# Patient Record
Sex: Male | Born: 1965 | Race: Black or African American | Hispanic: No | Marital: Single | Smoking: Never smoker
Health system: Southern US, Community
[De-identification: ages and names within clinical notes are randomized; demographics above are authoritative.]

## PROBLEM LIST (undated history)

## (undated) DIAGNOSIS — IMO0002 Reserved for concepts with insufficient information to code with codable children: Secondary | ICD-10-CM

## (undated) DIAGNOSIS — I4891 Unspecified atrial fibrillation: Secondary | ICD-10-CM

## (undated) DIAGNOSIS — I5022 Chronic systolic (congestive) heart failure: Secondary | ICD-10-CM

## (undated) DIAGNOSIS — R400 Somnolence: Secondary | ICD-10-CM

## (undated) DIAGNOSIS — I639 Cerebral infarction, unspecified: Secondary | ICD-10-CM

## (undated) DIAGNOSIS — I472 Ventricular tachycardia, unspecified: Secondary | ICD-10-CM

## (undated) DIAGNOSIS — Z79899 Other long term (current) drug therapy: Secondary | ICD-10-CM

## (undated) DIAGNOSIS — R9430 Abnormal result of cardiovascular function study, unspecified: Secondary | ICD-10-CM

## (undated) DIAGNOSIS — I428 Other cardiomyopathies: Secondary | ICD-10-CM

## (undated) DIAGNOSIS — I340 Nonrheumatic mitral (valve) insufficiency: Secondary | ICD-10-CM

## (undated) DIAGNOSIS — Z0271 Encounter for disability determination: Secondary | ICD-10-CM

## (undated) DIAGNOSIS — Z95810 Presence of automatic (implantable) cardiac defibrillator: Secondary | ICD-10-CM

## (undated) DIAGNOSIS — Z4502 Encounter for adjustment and management of automatic implantable cardiac defibrillator: Secondary | ICD-10-CM

## (undated) DIAGNOSIS — R51 Headache: Secondary | ICD-10-CM

## (undated) DIAGNOSIS — N289 Disorder of kidney and ureter, unspecified: Secondary | ICD-10-CM

## (undated) DIAGNOSIS — E876 Hypokalemia: Secondary | ICD-10-CM

## (undated) HISTORY — DX: Encounter for adjustment and management of automatic implantable cardiac defibrillator: Z45.02

## (undated) HISTORY — DX: Abnormal result of cardiovascular function study, unspecified: R94.30

## (undated) HISTORY — DX: Nonrheumatic mitral (valve) insufficiency: I34.0

## (undated) HISTORY — DX: Unspecified atrial fibrillation: I48.91

## (undated) HISTORY — DX: Disorder of kidney and ureter, unspecified: N28.9

## (undated) HISTORY — DX: Other cardiomyopathies: I42.8

## (undated) HISTORY — DX: Hypokalemia: E87.6

## (undated) HISTORY — DX: Ventricular tachycardia, unspecified: I47.20

## (undated) HISTORY — DX: Somnolence: R40.0

## (undated) HISTORY — DX: Ventricular tachycardia: I47.2

## (undated) HISTORY — DX: Cerebral infarction, unspecified: I63.9

## (undated) HISTORY — DX: Reserved for concepts with insufficient information to code with codable children: IMO0002

## (undated) HISTORY — DX: Headache: R51

## (undated) HISTORY — DX: Encounter for disability determination: Z02.71

## (undated) HISTORY — DX: Other long term (current) drug therapy: Z79.899

## (undated) HISTORY — DX: Chronic systolic (congestive) heart failure: I50.22

---

## 2008-07-22 ENCOUNTER — Encounter: Payer: Self-pay | Admitting: Cardiology

## 2008-10-13 ENCOUNTER — Ambulatory Visit: Payer: Self-pay | Admitting: Cardiology

## 2008-10-14 ENCOUNTER — Encounter: Payer: Self-pay | Admitting: Cardiology

## 2008-10-20 ENCOUNTER — Encounter: Payer: Self-pay | Admitting: Cardiology

## 2008-11-03 ENCOUNTER — Encounter: Payer: Self-pay | Admitting: Physician Assistant

## 2008-11-03 ENCOUNTER — Ambulatory Visit: Payer: Self-pay | Admitting: Cardiology

## 2008-11-17 ENCOUNTER — Ambulatory Visit: Payer: Self-pay | Admitting: Cardiology

## 2008-11-17 HISTORY — PX: TEE WITH CARDIOVERSION: SHX5442

## 2008-12-05 ENCOUNTER — Ambulatory Visit: Payer: Self-pay | Admitting: Cardiology

## 2009-02-20 ENCOUNTER — Encounter: Payer: Self-pay | Admitting: Cardiology

## 2009-10-13 ENCOUNTER — Encounter: Payer: Self-pay | Admitting: Cardiology

## 2009-10-25 ENCOUNTER — Encounter: Payer: Self-pay | Admitting: Cardiology

## 2009-10-26 ENCOUNTER — Ambulatory Visit: Payer: Self-pay | Admitting: Cardiology

## 2009-11-02 ENCOUNTER — Telehealth (INDEPENDENT_AMBULATORY_CARE_PROVIDER_SITE_OTHER): Payer: Self-pay | Admitting: *Deleted

## 2009-11-16 ENCOUNTER — Ambulatory Visit: Payer: Self-pay | Admitting: Cardiology

## 2009-11-16 ENCOUNTER — Encounter: Payer: Self-pay | Admitting: Cardiology

## 2009-12-03 ENCOUNTER — Encounter: Payer: Self-pay | Admitting: Cardiology

## 2009-12-04 ENCOUNTER — Telehealth (INDEPENDENT_AMBULATORY_CARE_PROVIDER_SITE_OTHER): Payer: Self-pay | Admitting: *Deleted

## 2009-12-04 ENCOUNTER — Encounter: Payer: Self-pay | Admitting: Cardiology

## 2009-12-31 ENCOUNTER — Encounter: Payer: Self-pay | Admitting: Cardiology

## 2010-02-02 ENCOUNTER — Encounter: Payer: Self-pay | Admitting: Cardiology

## 2010-02-03 ENCOUNTER — Ambulatory Visit: Payer: Self-pay | Admitting: Cardiology

## 2010-02-03 ENCOUNTER — Encounter (INDEPENDENT_AMBULATORY_CARE_PROVIDER_SITE_OTHER): Payer: Self-pay | Admitting: *Deleted

## 2010-08-16 ENCOUNTER — Encounter (INDEPENDENT_AMBULATORY_CARE_PROVIDER_SITE_OTHER): Payer: Self-pay | Admitting: *Deleted

## 2010-08-16 ENCOUNTER — Ambulatory Visit: Payer: Self-pay | Admitting: Cardiology

## 2010-08-19 ENCOUNTER — Ambulatory Visit: Payer: Self-pay | Admitting: Cardiology

## 2010-08-19 ENCOUNTER — Inpatient Hospital Stay (HOSPITAL_BASED_OUTPATIENT_CLINIC_OR_DEPARTMENT_OTHER): Admission: RE | Admit: 2010-08-19 | Discharge: 2010-08-19 | Payer: Self-pay | Admitting: Cardiology

## 2010-09-03 ENCOUNTER — Ambulatory Visit: Payer: Self-pay | Admitting: Cardiology

## 2010-09-06 ENCOUNTER — Encounter: Payer: Self-pay | Admitting: Cardiology

## 2010-09-13 ENCOUNTER — Encounter: Payer: Self-pay | Admitting: Cardiology

## 2010-09-16 ENCOUNTER — Encounter (INDEPENDENT_AMBULATORY_CARE_PROVIDER_SITE_OTHER): Payer: Self-pay | Admitting: *Deleted

## 2010-10-01 ENCOUNTER — Encounter: Payer: Self-pay | Admitting: Internal Medicine

## 2010-10-04 ENCOUNTER — Ambulatory Visit: Payer: Self-pay | Admitting: Internal Medicine

## 2010-10-08 ENCOUNTER — Encounter: Payer: Self-pay | Admitting: Cardiology

## 2010-10-11 ENCOUNTER — Ambulatory Visit: Payer: Self-pay | Admitting: Cardiology

## 2010-11-04 ENCOUNTER — Encounter: Payer: Self-pay | Admitting: Internal Medicine

## 2010-11-11 ENCOUNTER — Encounter: Payer: Self-pay | Admitting: Cardiology

## 2010-11-11 ENCOUNTER — Ambulatory Visit (HOSPITAL_COMMUNITY)
Admission: RE | Admit: 2010-11-11 | Discharge: 2010-11-12 | Payer: Self-pay | Source: Home / Self Care | Attending: Internal Medicine | Admitting: Internal Medicine

## 2010-11-11 DIAGNOSIS — Z4502 Encounter for adjustment and management of automatic implantable cardiac defibrillator: Secondary | ICD-10-CM

## 2010-11-11 HISTORY — DX: Encounter for adjustment and management of automatic implantable cardiac defibrillator: Z45.02

## 2010-11-11 HISTORY — PX: CARDIAC DEFIBRILLATOR PLACEMENT: SHX171

## 2010-11-12 ENCOUNTER — Encounter: Payer: Self-pay | Admitting: Internal Medicine

## 2010-11-12 LAB — BASIC METABOLIC PANEL
BUN: 11 mg/dL (ref 6–23)
CO2: 22 mEq/L (ref 19–32)
Calcium: 8.9 mg/dL (ref 8.4–10.5)
Chloride: 102 mEq/L (ref 96–112)
Creatinine, Ser: 1.55 mg/dL — ABNORMAL HIGH (ref 0.4–1.5)
GFR calc Af Amer: 59 mL/min — ABNORMAL LOW (ref >60)
GFR calc non Af Amer: 49 mL/min — ABNORMAL LOW (ref >60)
Glucose, Bld: 134 mg/dL — ABNORMAL HIGH (ref 70–99)
Potassium: 4.5 mEq/L (ref 3.5–5.1)
Sodium: 137 mEq/L (ref 135–145)

## 2010-11-22 ENCOUNTER — Encounter: Payer: Self-pay | Admitting: Internal Medicine

## 2010-11-22 ENCOUNTER — Ambulatory Visit: Admission: RE | Admit: 2010-11-22 | Discharge: 2010-11-22 | Payer: Self-pay | Source: Home / Self Care

## 2010-11-30 ENCOUNTER — Encounter: Payer: Self-pay | Admitting: Cardiology

## 2010-12-01 ENCOUNTER — Ambulatory Visit
Admission: RE | Admit: 2010-12-01 | Discharge: 2010-12-01 | Payer: Self-pay | Source: Home / Self Care | Attending: Cardiology | Admitting: Cardiology

## 2010-12-01 ENCOUNTER — Encounter: Payer: Self-pay | Admitting: Physician Assistant

## 2010-12-02 LAB — SURGICAL PCR SCREEN
MRSA, PCR: NEGATIVE
Staphylococcus aureus: NEGATIVE

## 2010-12-07 NOTE — Miscellaneous (Signed)
  Clinical Lists Changes  Observations: Added new observation of CARDCATHFIND:  HEMODYNAMIC DATA:  The wedge pressure was 16.  Pulmonary pressure was   58/24 with a mean of 36.  Pulmonary wedge pressure was 19 mean.  Left   ventricular pressure was 100/24.  The aortic pressure was 100/74 with a   mean of 87.  Cardiac output/cardiac index was 5.5/2.5 L/minute/meter   squared.  Pulmonary resistance calculated at 3.1 meters.      CONCLUSIONS:   1. Severe left ventricular dysfunction with global hypokinesis and       estimated ejection fraction of 15%.   2. Normal coronary angiography.   3. Elevated pulmonary wedge and pulmonary artery pressure with       elevated pulmonary vascular resistance of 3.1.      RECOMMENDATIONS:  Continued medical management.  The patient has a   narrow QRS and is probably not a candidate for biventricular pacing, but   may be a candidate for ICD placement.  We will arrange followup with Dr.   Myrtis Ser.               Bruce Elvera Lennox Juanda Chance, MD, [facility]  (08/20/2010 15:58)        Cardiac Cath  Procedure date:  08/20/2010  Findings:       HEMODYNAMIC DATA:  The wedge pressure was 16.  Pulmonary pressure was   58/24 with a mean of 36.  Pulmonary wedge pressure was 19 mean.  Left   ventricular pressure was 100/24.  The aortic pressure was 100/74 with a   mean of 87.  Cardiac output/cardiac index was 5.5/2.5 L/minute/meter   squared.  Pulmonary resistance calculated at 3.1 meters.      CONCLUSIONS:   1. Severe left ventricular dysfunction with global hypokinesis and       estimated ejection fraction of 15%.   2. Normal coronary angiography.   3. Elevated pulmonary wedge and pulmonary artery pressure with       elevated pulmonary vascular resistance of 3.1.      RECOMMENDATIONS:  Continued medical management.  The patient has a   narrow QRS and is probably not a candidate for biventricular pacing, but   may be a candidate for ICD placement.  We will arrange  followup with Dr.   Myrtis Ser.               Bruce Elvera Lennox Juanda Chance, MD, [facility]

## 2010-12-07 NOTE — Letter (Signed)
Summary: Appointment- Rescheduled  [facility] [address]   [place], [place] [address]   Phone: [phone]  Fax: [phone]     December 31, 2009 MRN: [id number]     [facility] [address] [place], [place]  [address]     Dear Adam [name],   Due to a change in our office schedule, your appointment on March 31 at 1:45 pm                     must be changed.    Your new appointment is scheduled for February 03, 2010 at 3:15 pm.  We look forward to participating in your health care needs.   Please contact us at the number listed above at your earliest convenience to reschedule this appointment if needed.       Sincerely,  Glass blower/designer

## 2010-12-07 NOTE — Progress Notes (Signed)
Summary: INCREASE BENAZEPRIL/NEED F/U  Phone Note Outgoing Call Call back at [facility] Phone [phone]   Call placed by: Carlye Grippe,  December 04, 2009 1:28 PM Call placed to: Patient's wife Summary of Call: spoke with wife and informed her that per Dr. Myrtis Ser,  benazepril needed to be increased to 10mg  daily and new prescription sent to [facility]  and that it was important that he f/u. Informed wife that he could take two of the 5mg  till they are finished. Initial call taken by: Carlye Grippe,  December 04, 2009 1:30 PM    New/Updated Medications: BENAZEPRIL HCL 10 MG TABS (BENAZEPRIL HCL) Take 1 tablet by mouth once a day Prescriptions: BENAZEPRIL HCL 10 MG TABS (BENAZEPRIL HCL) Take 1 tablet by mouth once a day  #30 x 6   Entered by:   Carlye Grippe   Authorized by:   Talitha Givens, MD, [facility]   Signed by:   Carlye Grippe on 12/04/2009   Method used:   Electronically to        [facility] (retail)       [address]       [place], [place]  [address]       Ph: [phone]       Fax: [phone]   RxID:   [id number]

## 2010-12-07 NOTE — Letter (Signed)
Summary: Engineer, materials at [facility]  [address]   [place], [place] [address]   Phone: [phone]  Fax: [phone]        September 16, 2010 MRN: [id number]    [facility] [address] [place], [place]  [address]    Dear Mr. Nikolai,  Your test ordered by Selena Batten has been reviewed by your physician (or physician assistant) and was found to be normal or stable. Your physician (or physician assistant) felt no changes were needed at this time.  __X__ Echocardiogram  ____ Cardiac Stress Test  __X__ Lab Work  ____ Peripheral vascular study of arms, legs or neck  ____ CT scan or X-ray  ____ Lung or Breathing test  ____ Other: KEEP APPOINTMENT WITH DR. ALLRED ON NOVEMBER 28TH IN [place]    AS PLANNED.   Thank you.   Cyril Loosen, RN, BSN    Duane Boston, M.D., F.A.C.C. Thressa Sheller, M.D., F.A.C.C. Oneal Grout, M.D., F.A.C.C. Cheree Ditto, M.D., F.A.C.C. Daiva Nakayama, M.D., F.A.C.C. Kenney Houseman, M.D., F.A.C.C. Jeanne Ivan, PA-C

## 2010-12-07 NOTE — Letter (Signed)
Summary: Appointment -missed  [facility] [address]   [place], [place] [address]   Phone: [phone]  Fax: [phone]     December 04, 2009 MRN: [id number]     [facility] [address] [place], [place]  [address]     Dear Mr. Baumler,  Our records indicate you missed your appointment on December 04, 2009                        with Dr.  Myrtis Ser.   It is very important that we reach you to reschedule this appointment. We look forward to participating in your health care needs.   Please contact us at the number listed above at your earliest convenience to reschedule this appointment.   Sincerely,    Glass blower/designer

## 2010-12-07 NOTE — Miscellaneous (Signed)
  Clinical Lists Changes  Observations: Added new observation of PAST MED HX: MITRAL REGURGITATION, MODERATE (ICD-396.3)...echo 10/2008...  /   TEE...11/17/2008.Marland Kitchenno LV clot /  mild to moderate mitral regurgitation..... echo.... January, 2011 SYSTOLIC HEART FAILURE, ACUTE (ICD-428.21) Cardiomyopathy...etiology unknown... multiple wall motion abnormalities..... patient has not wanted cardiac catheterization in the past. EF 35%..echo.....10/2008...multiple wall motion abnormalities /TEE  Jan/2010   /    EF 30-35%... echo... November 16, 2009... moderate left ventricular dilatation (02/02/2010 16:34) Added new observation of PRIMARY MD: Ernestine Conrad, MD (02/02/2010 16:34)       Past History:  Past Medical History: MITRAL REGURGITATION, MODERATE (ICD-396.3)...echo 10/2008...  /   TEE...11/17/2008.Marland Kitchenno LV clot /  mild to moderate mitral regurgitation..... echo.... January, 2011 SYSTOLIC HEART FAILURE, ACUTE (ICD-428.21) Cardiomyopathy...etiology unknown... multiple wall motion abnormalities..... patient has not wanted cardiac catheterization in the past. EF 35%..echo.....10/2008...multiple wall motion abnormalities /TEE  Jan/2010   /    EF 30-35%... echo... November 16, 2009... moderate left ventricular dilatation

## 2010-12-07 NOTE — Assessment & Plan Note (Signed)
Summary: 1 MOFU R/S FROM NO SHOW 1/28   Visit Type:  Follow-up Primary Provider:  Ernestine Conrad, MD  CC:  cardiomyopathy.  History of Present Illness: Patient is seen for followup of cardiomyopathy.  I saw him last in December, 2010.  We arranged for an echo in January, 2011.  That was done and the patient did not come for a visit at that time but is here today.  The EF remains in the 35% range.  I am disappointed that it has not improved substantially but the patient is feeling well.  He is taking his medications.  Preventive Screening-Counseling & Management  Alcohol-Tobacco     Smoking Status: never  Problems Prior to Update: 1)  Cardiomyopathy  (ICD-425.4) 2)  Mitral Regurgitation, Moderate  (ICD-396.3) 3)  Systolic Heart Failure, Acute  (ICD-428.21)  Current Medications (verified): 1)  Benazepril Hcl 10 Mg Tabs (Benazepril Hcl) .... Take 1 Tablet By Mouth Once A Day 2)  Furosemide 40 Mg Tabs (Furosemide) .... Take One Tablet By Mouth Daily. 3)  Coreg 6.25 Mg Tabs (Carvedilol) .... Take 1 Tablet By Mouth Twice A Day 4)  Aspirin Ec 325 Mg Tbec (Aspirin) .... Take One Tablet By Mouth Daily 5)  Simvastatin 20 Mg Tabs (Simvastatin) .... Take One Tablet By Mouth Daily At Bedtime  Allergies (verified): No Known Drug Allergies  Comments:  Nurse/Medical Assistant: The patient's medications and allergies were reviewed with the patient and were updated in the Medication and Allergy Lists. pt did not have list or bottles verbally verifed prescriptions  Past History:  Past Medical History: Last updated: 02/02/2010 MITRAL REGURGITATION, MODERATE (ICD-396.3)...echo 10/2008...  /   TEE...11/17/2008.Marland Kitchenno LV clot /  mild to moderate mitral regurgitation..... echo.... January, 2011 SYSTOLIC HEART FAILURE, ACUTE (ICD-428.21) Cardiomyopathy...etiology unknown... multiple wall motion abnormalities..... patient has not wanted cardiac catheterization in the past. EF  35%..echo.....10/2008...multiple wall motion abnormalities /TEE  Jan/2010   /    EF 30-35%... echo... November 16, 2009... moderate left ventricular dilatation  Family History: Last updated: 08/19/2009 Family History of Cancer:  Family History of Coronary Artery Disease:  Family History of Hypertension:   Social History: Last updated: 08/19/2009 Full Time Single  Tobacco Use - No.  Alcohol Use - yes Regular Exercise - no Drug Use - no  Risk Factors: Exercise: no (08/19/2009)  Review of Systems       Patient denies fever, chills, headache, sweats, rash, she vision, change in hearing, chest pain, cough, nausea vomiting, urinary symptoms.  All other systems are reviewed and are negative.  Vital Signs:  Patient profile:   45 year old male Height:      71 inches Weight:      223.4 pounds Pulse rate:   51 / minute BP sitting:   107 / 74  (left arm) Cuff size:   regular  Vitals Entered By: Youlanda Mighty RN (February 03, 2010 3:18 PM) CC: cardiomyopathy   Physical Exam  General:  In general he stable. Eyes:  no xanthelasma. Neck:  no jugular venous distention. Lungs:  lungs are clear.  Respiratory effort is nonlabored. Heart:  cardiac exam reveals S1-S2.  No clicks or significant murmurs. Abdomen:  abdomen is soft. Msk:  no musculoskeletal deformities. Extremities:  no peripheral edema. Psych:  patient is oriented to person time and place.  Affect is normal.   Impression & Recommendations:  Problem # 1:  CARDIOMYOPATHY (ICD-425.4)  His updated medication list for this problem includes:    Benazepril Hcl 10  Mg Tabs (Benazepril hcl) .Marland Kitchen... Take 1 tablet by mouth once a day    Furosemide 40 Mg Tabs (Furosemide) .Marland Kitchen... Take one tablet by mouth daily.    Coreg 6.25 Mg Tabs (Carvedilol) .Marland Kitchen... Take 1 tablet by mouth twice a day    Aspirin Ec 325 Mg Tbec (Aspirin) .Marland Kitchen... Take one tablet by mouth daily The patient is taking his medications.  His blood pressure and heart rate  are such that I cannot push the dose is any higher.  He and I have had a very clear discussion about the fact that we should not decrease his medicines.  Problem # 2:  SYSTOLIC HEART FAILURE, ACUTE (ICD-428.21)  His updated medication list for this problem includes:    Benazepril Hcl 10 Mg Tabs (Benazepril hcl) .Marland Kitchen... Take 1 tablet by mouth once a day    Furosemide 40 Mg Tabs (Furosemide) .Marland Kitchen... Take one tablet by mouth daily.    Coreg 6.25 Mg Tabs (Carvedilol) .Marland Kitchen... Take 1 tablet by mouth twice a day    Aspirin Ec 325 Mg Tbec (Aspirin) .Marland Kitchen... Take one tablet by mouth daily Clinically the patient is stable and not having symptoms of congestive heart failure.  With his LV dysfunction catheterization could be considered at some point but the patient does not want this at this time.  Patient Instructions: 1)  Your physician recommends that you continue on your current medications as directed. Please refer to the Current Medication list given to you today. 2)  Your physician wants you to follow-up in: 6months. You will receive a reminder letter in the mail about two months in advance. If you don't receive a letter, please call our office to schedule the follow-up appointment.

## 2010-12-07 NOTE — Miscellaneous (Signed)
  Clinical Lists Changes  Problems: Removed problem of SYSTOLIC HEART FAILURE, ACUTE (ICD-428.21) Added new problem of CHF (ICD-428.0) Added new problem of * ICD CONSIDERATION Added new problem of ATRIAL FIBRILLATION (ICD-427.31) Observations: Added new observation of PAST MED HX: MITRAL REGURGITATION, MODERATE (ICD-396.3)...echo 10/2008...  /   TEE...11/17/2008.Marland Kitchenno LV clot /  mild to moderate mitral regurgitation..... echo.... January, 2011  /  mild-to-moderate.Marland Kitchen echo .Marland KitchenNovember, 201100 a CHF   systolic Cardiomyopathy  nonischemic    ..( presumed nonischemic.. 2009)...  /   cardiac catheterization.  August 19, 2010....normal coronary arteries EF 35%..echo.....10/2008...multiple wall motion abnormalities /  TEE  Jan/2010   /    EF 30-35%... echo... November 16, 2009... moderate left ventricular dilatation /   EF 20-25%..echo... September 13, 2010 mild-to-moderate mitral regurgitation.. normal RV function... PA pressure 45 mm mercury (10/08/2010 8:12) Added new observation of REFERRING MD: Dr Myrtis Ser (10/08/2010 8:12) Added new observation of PRIMARY MD: Ernestine Conrad, MD (10/08/2010 8:12)       Past History:  Past Medical History: MITRAL REGURGITATION, MODERATE (ICD-396.3)...echo 10/2008...  /   TEE...11/17/2008.Marland Kitchenno LV clot /  mild to moderate mitral regurgitation..... echo.... January, 2011  /  mild-to-moderate.Marland Kitchen echo .Marland KitchenNovember, 201100 a CHF   systolic Cardiomyopathy  nonischemic    ..( presumed nonischemic.. 2009)...  /   cardiac catheterization.  August 19, 2010....normal coronary arteries EF 35%..echo.....10/2008...multiple wall motion abnormalities /  TEE  Jan/2010   /    EF 30-35%... echo... November 16, 2009... moderate left ventricular dilatation /   EF 20-25%..echo... September 13, 2010 mild-to-moderate mitral regurgitation.. normal RV function... PA pressure 45 mm mercury

## 2010-12-07 NOTE — Letter (Signed)
Summary: Implantable Device Instructions  [employer]ural [employer], Main Office  [address]   [place], [place] [address]   Phone: [phone]  Fax: [phone]      Implantable Device Instructions  You are scheduled for:  _____ Implantable Cardioverter Defibrillator   on 11/11/2010 with Dr. Johney Frame.  1.  Please arrive at the Short Stay Center at [facility] at 7:30am on the day of your procedure.  2.  Do not eat or drink after midnight the night before your procedure.  3.  Complete lab work on 11/04/10 at the [facility]  .  You do not have to be fasting.  4.  Do NOT take these medications for the morning of your procedure:  Furosemide.  5.  Plan for an overnight stay.  Bring your insurance cards and a list of your medications.  6.  Wash your chest and neck with antibacterial soap (any brand) the evening before and the morning of your procedure.  Rinse well.  7.  Education material received:               ICD _____           *If you have ANY questions after you get home, please call the office [phone]. Anselm Pancoast  *Every attempt is made to prevent procedures from being rescheduled.  Due to the nauture of Electrophysiology, rescheduling can happen.  The physician is always aware and directs the staff when this occurs.

## 2010-12-07 NOTE — Miscellaneous (Signed)
  Clinical Lists Changes  Observations: Added new observation of PAST MED HX: MITRAL REGURGITATION, MODERATE (ICD-396.3)...echo 10/2008...  /   TEE...11/17/2008.Marland Kitchenno LV clot /  mild to moderate mitral regurgitation..... echo.... January, 2011 SYSTOLIC HEART FAILURE, ACUTE (ICD-428.21) Cardiomyopathy...etiology unknown... multiple wall motion abnormalities..... patient has not wanted cardiac catheterization in the past. EF 35%..echo.....10/2008...multiple wall motion abnormalities /  EF 30-35%... echo... November 16, 2009... moderate left ventricular dilatation (12/03/2009 17:00) Added new observation of PRIMARY MD: Ernestine Conrad, MD (12/03/2009 17:00)       Past History:  Past Medical History: MITRAL REGURGITATION, MODERATE (ICD-396.3)...echo 10/2008...  /   TEE...11/17/2008.Marland Kitchenno LV clot /  mild to moderate mitral regurgitation..... echo.... January, 2011 SYSTOLIC HEART FAILURE, ACUTE (ICD-428.21) Cardiomyopathy...etiology unknown... multiple wall motion abnormalities..... patient has not wanted cardiac catheterization in the past. EF 35%..echo.....10/2008...multiple wall motion abnormalities /  EF 30-35%... echo... November 16, 2009... moderate left ventricular dilatation

## 2010-12-07 NOTE — Assessment & Plan Note (Signed)
Summary: 2 WK F/U-POST CATH-JM   Visit Type:  Follow-up Primary Ariele Vidrio:  Ernestine Conrad, MD  CC:  cardiomyopathy.  History of Present Illness: The patient is seen for followup of cardiomyopathy.  I saw him last August 16, 2010.  At that time I decided to increase his diuretic dose slightly.  I also made plans for cardiac catheterization.  The catheter was done on August 19, 2010.  I spoke with Dr. Juanda Chance that day and I have reviewed the catheter report.  The patient had no significant coronary artery disease.  His ejection fraction estimate in the Cath Lab was 15%.  He had elevated wedge and PA pressures.  Today I am seeing him for further followup.  With the addition of increased diuretic, he has not had any resting shortness of breath.  He still has shortness of breath with activity at work when walking up stairs.  Preventive Screening-Counseling & Management  Alcohol-Tobacco     Smoking Status: never  Current Medications (verified): 1)  Benazepril Hcl 5 Mg Tabs (Benazepril Hcl) .... Take 1 Tablet By Mouth Once A Day 2)  Furosemide 40 Mg Tabs (Furosemide) .... Take One Tablet By Mouth Two Times A Day 3)  Coreg 6.25 Mg Tabs (Carvedilol) .... Take 1/2 Tablet By Mouth Twice A Day 4)  Aspirin Ec 325 Mg Tbec (Aspirin) .... Take One Tablet By Mouth Daily 5)  Simvastatin 20 Mg Tabs (Simvastatin) .... Take One Tablet By Mouth Daily At Bedtime  Allergies (verified): No Known Drug Allergies  Comments:  Nurse/Medical Assistant: The patient is currently on medications but does not know the name or dosage at this time. Instructed to contact our office with details. Will update medication list at that time.  Past History:  Past Medical History: MITRAL REGURGITATION, MODERATE (ICD-396.3)...echo 10/2008...  /   TEE...11/17/2008.Marland Kitchenno LV clot /  mild to moderate mitral regurgitation..... echo.... January, 2011 SYSTOLIC HEART FAILURE, ACUTE (ICD-428.21) Cardiomyopathy..nonischemic..... in 2009  etiology was unknown.... cardiac catheterization.  August 19, 2010 reveals normal coronary arteries EF 35%..echo.....10/2008...multiple wall motion abnormalities /TEE  Jan/2010   /    EF 30-35%... echo... November 16, 2009... moderate left ventricular dilatation  Review of Systems       Patient denies fever, chills, headache, sweats, rash, change in vision, change in hearing, chest pain, cough, nausea vomiting, urinary symptoms.  All other systems are reviewed and are negative.  Vital Signs:  Patient profile:   45 year old male Height:      71 inches Weight:      216 pounds Pulse rate:   55 / minute BP sitting:   114 / 77  (left arm) Cuff size:   large  Vitals Entered By: Carlye Grippe (September 03, 2010 10:12 AM)  Physical Exam  General:  Patient is stable today. Head:  head is atraumatic. Eyes:  no xanthelasma. Neck:  no jugular venous distention. Chest Wall:  no chest wall tenderness. Lungs:  lungs are clear.  Respiratory effort is nonlabored. Heart:  cardiac exam reveals an S1-S2.  No clicks or significant murmurs. Abdomen:  abdomen is soft. Msk:  no musculoskeletal deformities. Extremities:  no peripheral edema. Skin:  no skin rashes. Psych:  patient is oriented to person time and place.  Affect is normal.   Impression & Recommendations:  Problem # 1:  MITRAL REGURGITATION, MODERATE (ICD-396.3) The patient has mitral regurgitation.  Originally in 2009 TEE was done to assess the valve and to rule out left ventricular clot.  There  was no definite clot seen.  Over time mitral regurgitation has been felt to be mild to moderate.  Followup echo will be done within the next few weeks.  Problem # 2:  CARDIOMYOPATHY (ICD-425.4)  His updated medication list for this problem includes:    Benazepril Hcl 5 Mg Tabs (Benazepril hcl) .Marland Kitchen... Take 1 tablet by mouth once a day    Furosemide 40 Mg Tabs (Furosemide) .Marland Kitchen... Take one tablet by mouth two times a day    Coreg 6.25 Mg Tabs  (Carvedilol) .Marland Kitchen... Take 1/2 tablet by mouth twice a day    Aspirin Ec 325 Mg Tbec (Aspirin) .Marland Kitchen... Take one tablet by mouth daily  Orders: T-Basic Metabolic Panel [id number]) 2-D Echocardiogram (2D Echo) The patient is stable from the symptom viewpoint.  He is able to work but gets short of breath when walking up stairs.  Cardiac catheterization revealed normal coronary arteries.  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  His filling pressures were high in the catheter lab.  I had increased his Lasix before that study and I was worried about his renal function.  However knowing that his filling pressures were high in the Cath Lab his dose of diuretic will be continued.  Chemistry is to be checked today to help with further decisions.  I reviewed all the specifics of the catheter report with the patient and his wife.  I also personally filled out a multiple page form to help him at work.  I spent greater than 45 minutes face-to-face with the patient and his wife discussing his cardiomyopathy.  I explained the potential risk of a dangerous arrhythmia.  I recommended EP evaluation for the possible placement of an ICD.  His QRS is narrow.  I suspect he will not be a candidate for CRT therapy.  However, the EP team will evaluate this question.  I explained to the patient that it may be helpful at some point for him to be seen by Dr.Bensimhon for a fall review of his status.  I reconsidered his medications today.  He is on only low-dose carvedilol, but his heart rate is already slow area he is on an ACE inhibitor.  I have not yet started spironolactone.  The patient now seems quite motivated to follow through on my recommendations.  I am hopeful that I will be able to continue to titrate his meds.    Problem # 3:  additional cardiomyopathy I am hopeful that over time I will be able to titrate his medicines up further. This has not been possible up to now as his office visits were irregular for a  period of time.  I do believe the patient now fully understands the magnitude of this problem.  An appointment is being made for EP evaluation.  I will then see him for further evaluation.  Patient Instructions: 1)  Labs:  BMET 2)  2D Echo 3)  New EP referral to Dr. Johney Frame 4)  Follow up in  4 wks

## 2010-12-07 NOTE — Letter (Signed)
Summary: Return To Work  [employer], Main Office  [address]   [place], [place] [address]   Phone: [phone]  Fax: [phone]    10/04/2010  TO: WHOM IT MAY CONCERN   RE: Adam [name] [address] [id number]   The above named individual is under my medical care and will be out of work from 11/11/2010-11/18/2010.  When he returns on 11/19/2010 he will return with light duty.  No lifting over 10 pounds until 12/23/2010 per Dr. Hillis Range  If you have any further questions or need additional information, please call.     Sincerely,    Dr. Fayrene Fearing Jamai Dolce/Kelly Darl Pikes, RN, BSN

## 2010-12-07 NOTE — Letter (Signed)
Summary: Internal Correspondence/ FAXED PRE-CATH ORDER  Internal Correspondence/ FAXED PRE-CATH ORDER   Imported By: Dorise Hiss 08/30/2010 15:32:00  _____________________________________________________________________  External Attachment:    Type:   Image     Comment:   External Document

## 2010-12-07 NOTE — Assessment & Plan Note (Signed)
Summary: 6 MO FU PER SEPT REMINDER-SRS   Visit Type:  Follow-up Primary Provider:  Ernestine Conrad, MD  CC:  cardiomyopathy.  History of Present Illness: The patient returns for followup evaluation of cardiomyopathy.  Up to this point the etiology has been unclear.  Patient previously had not been interested in proceeding with cardiac catheterization.  He now is interested.  He continues to work.  He does have shortness of breath with climbing stairs and this is not surprising.  He also has had some PND and orthopnea.  Taking his diuretic is problematic but he does it reliably.  If he takes his meds early in the morning and then goes to work it is a long distance for him to walk each time he needs to urinate.  He works 10-12 hour shifts.  If he takes the medicine at night it keeps him up all night.  Preventive Screening-Counseling & Management  Alcohol-Tobacco     Smoking Status: never  Current Medications (verified): 1)  Benazepril Hcl 5 Mg Tabs (Benazepril Hcl) .... Take 1 Tablet By Mouth Once A Day 2)  Furosemide 40 Mg Tabs (Furosemide) .... Take One Tablet By Mouth Daily. 3)  Coreg 6.25 Mg Tabs (Carvedilol) .... Take 1/2 Tablet By Mouth Twice A Day 4)  Aspirin Ec 325 Mg Tbec (Aspirin) .... Take One Tablet By Mouth Daily 5)  Simvastatin 20 Mg Tabs (Simvastatin) .... Take One Tablet By Mouth Daily At Bedtime  Allergies (verified): No Known Drug Allergies  Comments:  Nurse/Medical Assistant: The patient's medication bottles and allergies were reviewed with the patient and were updated in the Medication and Allergy Lists.  Past History:  Past Medical History: Last updated: 02/02/2010 MITRAL REGURGITATION, MODERATE (ICD-396.3)...echo 10/2008...  /   TEE...11/17/2008.Marland Kitchenno LV clot /  mild to moderate mitral regurgitation..... echo.... January, 2011 SYSTOLIC HEART FAILURE, ACUTE (ICD-428.21) Cardiomyopathy...etiology unknown... multiple wall motion abnormalities..... patient has not wanted  cardiac catheterization in the past. EF 35%..echo.....10/2008...multiple wall motion abnormalities /TEE  Jan/2010   /    EF 30-35%... echo... November 16, 2009... moderate left ventricular dilatation  Review of Systems       Patient denies fever, chills, rash, headache, change in vision, change in hearing, chest pain, cough, nausea vomiting, urinary symptoms.  All other systems are reviewed and are negative.  Vital Signs:  Patient profile:   45 year old male Height:      71 inches Weight:      215 pounds BMI:     30.09 Pulse rate:   73 / minute BP sitting:   112 / 73  (right arm) Cuff size:   large  Vitals Entered By: Carlye Grippe (August 16, 2010 8:34 AM)  Nutrition Counseling: Patient's BMI is greater than 25 and therefore counseled on weight management options.  Physical Exam  General:  The patient is stable today. Head:  head is atraumatic. Eyes:  no xanthelasma. Neck:  no jugular venous distention. Chest Wall:  no chest wall tenderness. Lungs:  lungs are clear.  Respiratory effort is nonlabored. Heart:  cardiac exam reveals S1-S2.  No clicks or significant murmurs. Abdomen:  abdomen is soft. Msk:  no musculoskeletal deformities. Extremities:  no peripheral edema. Skin:  no skin rashes. Psych:  patient is oriented to person time and place.  Affect is normal.   Impression & Recommendations:  Problem # 1:  CARDIOMYOPATHY (ICD-425.4)  His updated medication list for this problem includes:    Benazepril Hcl 5 Mg Tabs (Benazepril hcl) .Marland KitchenMarland KitchenMarland KitchenMarland Kitchen  Take 1 tablet by mouth once a day    Furosemide 40 Mg Tabs (Furosemide) .Marland Kitchen... Take one tablet by mouth two times a day    Coreg 6.25 Mg Tabs (Carvedilol) .Marland Kitchen... Take 1/2 tablet by mouth twice a day    Aspirin Ec 325 Mg Tbec (Aspirin) .Marland Kitchen... Take one tablet by mouth daily The patient's is having signs of symptomatic heart failure.  He is on an ACE inhibitor a diuretic and beta blocker.  Up to this point we have not been able to titrate  his drugs to a higher level because of hypotension and symptoms.  It appears that we now have leeway to push his meds up.  There is also volume component to his current symptoms.  We will increase his diuretic to 40 mg b.i.d. he will take his second dose at 3 or 4 in the afternoon.  He is now willing to proceed with cardiac catheterization.  We will proceed with left and right heart catheter.  Once we have this information we can determine the next steps.  I've chosen not to increase his other medications today because we are increasing his diuretic and we are planning cardiac catheterization.  At some point we will need to discuss more advanced therapies for his heart failure.  Orders: T-Chest x-ray, 2 views (44010) T-Basic Metabolic Panel [id number]) T-CBC No Diff ([id number]) T-Protime, Auto ([id number]) T-PTT ([id number]) EKG w/ Interpretation (93000) Cardiac Catheterization (Cardiac Cath)  Problem # 2:  MITRAL REGURGITATION, MODERATE (ICD-396.3) Mitral regurgitation was mild to moderate in January, 2011.  This will be assessed at catheter and with followup echo when appropriate.  Patient Instructions: 1)  Return for office visit with Dr. Myrtis Ser on Friday, September 03, 2010 at 10:30am. 2)  Increase Lasix (furosemide) 40mg  by mouth two times a day. 3)  Your physician recommends that you go to the [facility] for lab work and a chest x-ray: DO TODAY. 4)  Your physician has requested that you have a cardiac catheterization.  Cardiac catheterization is used to diagnose and/or treat various heart conditions. Doctors may recommend this procedure for a number of different reasons. The most common reason is to evaluate chest pain. Chest pain can be a symptom of coronary artery disease (CAD), and cardiac catheterization can show whether plaque is narrowing or blocking your heart's arteries. This procedure is also used to evaluate the valves, as well as measure the blood flow and oxygen levels in  different parts of your heart.  For further information please visit [email or web address]  Please follow instruction sheet, as given. Prescriptions: FUROSEMIDE 40 MG TABS (FUROSEMIDE) Take one tablet by mouth two times a day  #60 x 6   Entered by:   Cyril Loosen, RN, BSN   Authorized by:   Talitha Givens, MD, [facility]   Signed by:   Cyril Loosen, RN, BSN on 08/16/2010   Method used:   Electronically to        [facility] (retail)       [address]       [place], [place]  [address]       Ph: [phone]       Fax: [phone]   RxID:   [id number]

## 2010-12-07 NOTE — Assessment & Plan Note (Signed)
Summary: f62m -agh   Visit Type:  Follow-up Referring Provider:  Hillis Range, MD Primary Provider:  Ernestine Conrad, MD  CC:  cardiomyopathy.  History of Present Illness: Patient is seen for followup of his cardiomyopathy area and.  I saw him last October 28 02011.  Since then he has been seen by Dr.Allred, and ICD he is scheduled for January, 2012.  In many questions about this today but he is in agreement.  Is not having any chest pain or significant shortness of breath.  His ejection fraction was in the 25% range with the study that was done since the last visit.  His heart rate is 66.  He is on an ACE inhibitor and carvedilol.  I've chosen not to change his medicine today but to wait for his ICD be placed.  After that we will continue to push his medications higher.  Preventive Screening-Counseling & Management  Alcohol-Tobacco     Smoking Status: never  Current Medications (verified): 1)  Benazepril Hcl 5 Mg Tabs (Benazepril Hcl) .... Take 1 Tablet By Mouth Once A Day 2)  Furosemide 40 Mg Tabs (Furosemide) .... Take One Tablet By Mouth Two Times A Day 3)  Coreg 6.25 Mg Tabs (Carvedilol) .... Take 1/2 Tablet By Mouth Twice A Day 4)  Aspirin Ec 325 Mg Tbec (Aspirin) .... Take One Tablet By Mouth Daily 5)  Simvastatin 20 Mg Tabs (Simvastatin) .... Take One Tablet By Mouth Daily At Bedtime  Allergies (verified): No Known Drug Allergies  Comments:  Nurse/Medical Assistant: The patient's medications and allergies were verbally reviewed with the patient and were updated in the Medication and Allergy Lists.  Past History:  Past Medical History: MITRAL REGURGITATION, MODERATE (ICD-396.3)...echo 10/2008...  /   TEE...11/17/2008.Marland Kitchenno LV clot /  mild to moderate mitral regurgitation..... echo.... January, 2011  /  mild-to-moderate.Marland Kitchen echo .Marland KitchenNovember, 201100 a CHF   systolic Cardiomyopathy  nonischemic    ..( presumed nonischemic.. 2009)...  /   cardiac catheterization.  August 19, 2010....normal coronary arteries /  ICD be placed in January, 2012 EF 35%..echo.....10/2008...multiple wall motion abnormalities /  TEE  Jan/2010   /    EF 30-35%... echo... November 16, 2009... moderate left ventricular dilatation /   EF 20-25%..echo... September 13, 2010 mild-to-moderate mitral regurgitation.. normal RV function... PA pressure 45 mm mercury  Review of Systems       Patient denies fever, chills, headache, sweats, rash, change in vision, change in hearing, chest pain, cough, nausea vomiting, urinary symptoms.  All other systems are reviewed and are negative.  Vital Signs:  Patient profile:   45 year old male Height:      71 inches Weight:      220 pounds Pulse rate:   61 / minute BP sitting:   110 / 76  (left arm) Cuff size:   large  Vitals Entered By: Carlye Grippe (October 11, 2010 9:09 AM)  Physical Exam  General:  patient is stable. Head:  head is atraumatic. Eyes:  no xanthelasma. Neck:  no jugular distention. Chest Wall:  no chest wall tenderness. Lungs:  lungs are clear.  Respiratory effort is unlabored. Heart:  cardiac exam reveals S1-S2.  No clicks or significant murmurs. Abdomen:  abdomen soft. Msk:  No musculoskeletal deformities. Extremities:  no peripheral edema. Skin:  no skin rashes. Psych:  patient is oriented to person place her affect is normal.   Impression & Recommendations:  Problem # 1:  ATRIAL FIBRILLATION (ICD-427.31)  His updated  medication list for this problem includes:    Coreg 6.25 Mg Tabs (Carvedilol) .Marland Kitchen... Take 1/2 tablet by mouth twice a day    Aspirin Ec 325 Mg Tbec (Aspirin) .Marland Kitchen... Take one tablet by mouth daily We've not seen any recurring atrial fibrillation.  Problem # 2:  * ICD CONSIDERATION The patient is now scheduled for an ICD.  I had a long discussion with him today about this.  Problem # 3:  CARDIOMYOPATHY (ICD-425.4)  His updated medication list for this problem includes:    Benazepril Hcl 5 Mg Tabs  (Benazepril hcl) .Marland Kitchen... Take 1 tablet by mouth once a day    Furosemide 40 Mg Tabs (Furosemide) .Marland Kitchen... Take one tablet by mouth two times a day    Coreg 6.25 Mg Tabs (Carvedilol) .Marland Kitchen... Take 1/2 tablet by mouth twice a day    Aspirin Ec 325 Mg Tbec (Aspirin) .Marland Kitchen... Take one tablet by mouth daily Chosen not to change his medicines today.  After his ICD is in place I will consider further titration.  Problem # 4:  CHF (ICD-428.0) Clinically his volume status is stable.  No change in therapy.  I spoke with the patient and his wife for greater than 30 minutes today about all aspects of his care.  We'll see him back after his ICD is placed.  Patient Instructions: 1)  Follow up with Dr. Myrtis Ser on Wed, December 01, 2010 at 10:45am. 2)  Your physician recommends that you continue on your current medications as directed. Please refer to the Current Medication list given to you today.

## 2010-12-07 NOTE — Assessment & Plan Note (Signed)
Summary: nep/ discuss icd. pt has bcbs./ gd   Visit Type:  Initial Consult Referring Provider:  Dr Myrtis Ser Primary Provider:  Ernestine Conrad, MD   History of Present Illness: Adam [name] is a pleasant 45 yo AAM with a dilated CM (EF 20%) and NYHA Class II/III CHF who presents today for EP consultation regarding risks of sudden death.  He reports being in good health until 2009 when he presented to [facility] with acute decompensated CHF.  He was found to have an EF of 35%.  He initially declined cath and was followed by Dr Myrtis Ser with medical therapy.  He recently underwent [facility] 10/11 which revealed no CAD but severe global HK with EF 15%.  Despite optimal medical therapy, his EF has not improved.  He reports dypsnea with moderate activity.  He reports compliance with medical therapy.  He works in [employer] and has been on [employer] duty" due to inability to climb stairs or lift heavy boxes.  He reports frequent fatigue.  He has 2 pillow orthopnea. He previously had PND though this has improved with lasix.   He denies palpitations, CP, presyncope, or syncope. He denies viral illness, heavy ETOH, or polysubstance use.  Current Medications (verified): 1)  Benazepril Hcl 5 Mg Tabs (Benazepril Hcl) .... Take 1 Tablet By Mouth Once A Day 2)  Furosemide 40 Mg Tabs (Furosemide) .... Take One Tablet By Mouth Two Times A Day 3)  Coreg 6.25 Mg Tabs (Carvedilol) .... Take 1/2 Tablet By Mouth Twice A Day 4)  Aspirin Ec 325 Mg Tbec (Aspirin) .... Take One Tablet By Mouth Daily 5)  Simvastatin 20 Mg Tabs (Simvastatin) .... Take One Tablet By Mouth Daily At Bedtime  Allergies (verified): No Known Drug Allergies  Past History:  Past Medical History: MITRAL REGURGITATION, MODERATE (ICD-396.3)...echo 10/2008...  /   TEE...11/17/2008.Marland Kitchenno LV clot /  mild to moderate mitral regurgitation..... echo.... January, 2011 Chronic Systolic dysfunction Cardiomyopathy..nonischemic..... in 2009 etiology was unknown....  cardiac catheterization.  August 19, 2010 reveals normal coronary arteries EF 35%..echo.....10/2008...multiple wall motion abnormalities /TEE  Jan/2010   /    EF 30-35%... echo... November 16, 2009... moderate left ventricular dilatation  Past Surgical History: none  Family History: mother had HTN. Father had multiple MIs (earliest age 54),  he died at age 30 of MI Oldest brother died suddenly in prison of a presumed heart attack. Younger brother has congestive heart father. Paternal grandfather died after MI at 81s.    5 children are healthy.  Social History: Lives in [place] with fiance.  Works full Time for [employer]. Tobacco Use - No.  Alcohol Use - occasional (not heavy) Regular Exercise - no Drug Use - no  Review of Systems       All systems are reviewed and negative except as listed in the HPI.   Vital Signs:  Patient profile:   45 year old male Height:      71 inches Weight:      211 pounds BMI:     29.53 Pulse rate:   55 / minute BP sitting:   106 / 66  (left arm)  Vitals Entered By: Laurance Flatten CMA (October 04, 2010 2:18 PM)  Physical Exam  General:  Well developed, well nourished, in no acute distress. Head:  normocephalic and atraumatic Eyes:  PERRLA/EOM intact; conjunctiva and lids normal. Mouth:  Teeth, gums and palate normal. Oral mucosa normal. Neck:  Neck supple, no JVD. No masses, thyromegaly or abnormal cervical nodes.  Lungs:  Clear bilaterally to auscultation and percussion. Heart:  Non-displaced PMI, chest non-tender; regular rate and rhythm, S1, S2 without murmurs, rubs or gallops. Carotid upstroke normal, no bruit. Normal abdominal aortic size, no bruits. Femorals normal pulses, no bruits. Pedals normal pulses. No edema, no varicosities. Abdomen:  Bowel sounds positive; abdomen soft and non-tender without masses, organomegaly, or hernias noted. No hepatosplenomegaly. Msk:  Back normal, normal gait. Muscle strength and tone normal. Pulses:  pulses  normal in all 4 extremities Extremities:  No clubbing or cyanosis. Neurologic:  Alert and oriented x 3. Skin:  Intact without lesions or rashes. Cervical Nodes:  no significant adenopathy Psych:  Normal affect.   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  He meets SCD-HeFT criteria for ICD implantation for primary prevention of sudden death.  Risks, benefits, alternatives to ICD implantation were discussed in detail with the patient today.  He understands that the risks include but are not limited to bleeding, infection, pneumothorax, perforation, tamponade, vascular damage, renal failure, MI, stroke, death, inappropriate shocks, and lead dislodgement.  He accepts these risks and wishes to proceed.  Given his bradycardia, I would recommend a dual chamber ICD.  He does not meet indication for CRT.  Given concerns for elevated DFT, I would recommend a St Jude ICD.  Other Orders: EKG w/ Interpretation (93000)  Patient Instructions: 1)  Your physician has recommended that you have a defibrillator inserted.  An implantable cardioverter defibrillator (ICD) is a small device that is placed in your chest or, in rare cases, your abdomen. This device uses electrical pulses or shocks to help control life-threatening, irregular heartbeats that could lead the heart to suddenly stop beating (sudden cardiac arrest). Leads are attached to the ICD that goes into your heart. This is done in the hospital and usually requires an overnight stay. Please see the instruction sheet given to you today for more information.

## 2010-12-07 NOTE — Letter (Signed)
Summary: Cardiac Cath Instructions - [facility]  [facility] at [facility] [address]. [address]   [place], [place] [address]   Phone: [phone]  Fax: [phone]     08/16/2010 MRN: [id number]  [facility] [address] [place], [place]  [address]  Dear Mr. Gathright,   You are scheduled for a Cardiac Catheterization on Thursday, August 19, 2010 with Dr. Juanda Chance.  Please arrive to the 1st floor of the Heart and Vascular Center at [facility] at 0930 am on the day of your procedure. Please do not arrive before 6:30 a.m. Call the Heart and Vascular Center at [phone] if you are unable to make your appointmnet. The Code to get into the parking garage under the building is 0200. Take the elevators to the 1st floor. You must have someone to drive you home. Someone must be with you for the first 24 hours after you arrive home. Please wear clothes that are easy to get on and off and wear slip-on shoes. Do not eat or drink after midnight except water with your medications that morning. Bring all your medications and current insurance cards with you.  _X__ DO NOT take these medications before your procedure: Hold Furosemide the morning of your cath.  _X__ Make sure you take your aspirin.  _X__ You may take all of your remaining medications with water that morning.  ___ DO NOT take ANY medications before your procedure.  ___ Pre-med instructions:  ________________________________________________________________________  The usual length of stay after your procedure is 2 to 3 hours. This can vary.  If you have any questions, please call the office at the number listed above.  Cyril Loosen, RN, BSN            Directions to the [facility]  Please Note : Park in [place] under the building not the parking deck.  From [employer]: Turn onto [address] Left onto [place] (1st stoplight) Right at the brick entrance to  the hospital ([address] circle [address]) Bear to the right and you will see a blue sign "Heart and Vascular Center" Parking garage is a sharp right'to get through the gate out in the code _0200______. Once you park, take the elevator to the first floor. Please do not arrive before 0630am. The building will be dark before that time.   From [address] Turn onto [address] Turn left into the brick entrance to the hospital (Main circle drive) [place] to the right and you will see a blue sign "[facility] Center" Parking garage is a sharp right, to get thru the gate put in the code _0200___. Once you park, take the elevator to the first floor. Please do not arrive before 0630am. The building will be dark before that time

## 2010-12-07 NOTE — Letter (Signed)
Summary: Work Writer at [employer]	[place]. [address]   [place], [place] [address]   Phone: [phone]  Fax: [phone]     February 03, 2010    [facility] Adam [name]   The above named patient had a medical visit today  at our office.   Please take this into consideration when reviewing the time away from work/school.      Sincerely yours,  Recardo Evangelist.  [facility]

## 2010-12-07 NOTE — Miscellaneous (Signed)
  Clinical Lists Changes  Observations: Added new observation of CARDIO HPI: The patient was scheduled to be seen today.  When he did not come for his visit we called him.  An echo had been done and I want to review it with him.  Also I want to carefully follow his medications for his left ventricular dysfunction.  The echo was done anyway to 10, 2011.  The left ventricle is dilated.  Ejection fraction is 30-35%.  Because the patient's blood pressure was slightly elevated at the time of his last visit we will pass the message by telephone and he should increase his benazepril to 10 mg daily.  We will make a followup visit for him I strongly encourage him to be here so that we can provide him optimal care. (12/04/2009 12:39) Added new observation of PRIMARY MD: Ernestine Conrad, MD (12/04/2009 12:39)      Primary Provider:  Ernestine Conrad, MD   History of Present Illness: The patient was scheduled to be seen today.  When he did not come for his visit we called him.  An echo had been done and I want to review it with him.  Also I want to carefully follow his medications for his left ventricular dysfunction.  The echo was done anyway to 10, 2011.  The left ventricle is dilated.  Ejection fraction is 30-35%.  Because the patient's blood pressure was slightly elevated at the time of his last visit we will pass the message by telephone and he should increase his benazepril to 10 mg daily.  We will make a followup visit for him I strongly encourage him to be here so that we can provide him optimal care.

## 2010-12-08 ENCOUNTER — Telehealth (INDEPENDENT_AMBULATORY_CARE_PROVIDER_SITE_OTHER): Payer: Self-pay | Admitting: *Deleted

## 2010-12-08 ENCOUNTER — Telehealth: Payer: Self-pay | Admitting: Internal Medicine

## 2010-12-09 NOTE — Miscellaneous (Signed)
Summary: Device preload  Clinical Lists Changes  Observations: Added new observation of ICD INDICATN: ICM  (11/12/2010 13:11) Added new observation of ICDLEADSTAT2: active (11/12/2010 13:11) Added new observation of ICDLEADSER2: [id number] (11/12/2010 13:11) Added new observation of ICDLEADMOD2: 7122  (11/12/2010 13:11) Added new observation of ICDLEADLOC2: RV  (11/12/2010 13:11) Added new observation of ICDLEADSTAT1: active  (11/12/2010 13:11) Added new observation of ICDLEADSER1: [id number]  (11/12/2010 13:11) Added new observation of ICDLEADMOD1: 2130QM  (11/12/2010 13:11) Added new observation of ICDLEADLOC1: RA  (11/12/2010 13:11) Added new observation of ICD IMP MD: Hillis Range, MD  (11/12/2010 13:11) Added new observation of ICDLEADDOI2: 11/11/2010  (11/12/2010 13:11) Added new observation of ICDLEADDOI1: 11/11/2010  (11/12/2010 13:11) Added new observation of ICD IMPL DTE: 11/11/2010  (11/12/2010 13:11) Added new observation of ICD SERL#: [id number]  (11/12/2010 13:11) Added new observation of ICD MODL#: [id number]  (11/12/2010 41:32) Added new observation of ICDMANUFACTR: St Jude  (11/12/2010 13:11) Added new observation of ICD MD: Hillis Range, MD  (11/12/2010 13:11)       ICD Specifications Following MD:  Hillis Range, MD     ICD Vendor:  St Jude     ICD Model Number:  [id number]     ICD Serial Number:  [id number] ICD DOI:  11/11/2010     ICD Implanting MD:  Hillis Range, MD  Lead 1:    Location: RA     DOI: 11/11/2010     Model #: [id number]     Serial #: [id number]     Status: active Lead 2:    Location: RV     DOI: 11/11/2010     Model #: [id number]     Serial #: [id number]     Status: active  Indications::  ICM

## 2010-12-09 NOTE — Assessment & Plan Note (Signed)
Summary: 6 WK F/U PER 12/5 OV-JM   Visit Type:  Follow-up Primary Provider:  Ernestine Conrad, MD   History of Present Illness: patient returns for scheduled followup.  Since his last OV, he has undergone successful placement of a Medtronic dual-chamber ICD, on 01/05, by Dr. Johney Frame. He has been seen for routine followup in our pacer clinic in [place], and is scheduled to return to Dr Johney Frame , here in the device clinic, in April.  Clinically, he denies any postop complications, or symptoms suggestive of decompensated heart failure. He denies any firing of his device. He recalls having some brief "fluttering", the day after his discharge, but none since then.  Preventive Screening-Counseling & Management  Alcohol-Tobacco     Smoking Status: never  Current Medications (verified): 1)  Benazepril Hcl 5 Mg Tabs (Benazepril Hcl) .... Take 1 Tablet By Mouth Once A Day 2)  Furosemide 40 Mg Tabs (Furosemide) .... Take One Tablet By Mouth Two Times A Day 3)  Coreg 6.25 Mg Tabs (Carvedilol) .... Take 1/2 Tablet By Mouth Twice A Day 4)  Aspirin Ec 325 Mg Tbec (Aspirin) .... Take One Tablet By Mouth Daily 5)  Simvastatin 20 Mg Tabs (Simvastatin) .... Take One Tablet By Mouth Daily At Bedtime  Allergies (verified): No Known Drug Allergies  Comments:  Nurse/Medical Assistant: The patient's medications and allergies were verbally reviewed with the patient and were updated in the Medication and Allergy Lists.  Past History:  Past Medical History: MITRAL REGURGITATION, MODERATE (ICD-396.3)...echo 10/2008...  /   TEE...11/17/2008.Marland Kitchenno LV clot /  mild to moderate mitral regurgitation..... echo.... January, 2011  /  mild-to-moderate.Marland Kitchen echo .Marland KitchenNovember, 2011 CHF   systolic Cardiomyopathy  nonischemic    ..( presumed nonischemic.. 2009)...  /   cardiac catheterization.  August 19, 2010....normal coronary arteries  EF 35%..echo.....10/2008...multiple wall motion abnormalities /  TEE  Jan/2010   /    EF 30-35%...  echo... November 16, 2009... moderate left ventricular dilatation /   EF 20-25%..echo... September 13, 2010 mild-to-moderate mitral regurgitation.. normal RV function... PA pressure 45 mm mercury ICD   November 11, 2010  [employer]. Jude  Dr.Allred  Renal insufficiency... creatinine 1.5 (GFR greater than 50)  Review of Systems       No fevers, chills, hemoptysis, dysphagia, melena, hematocheezia, hematuria, rash, claudication, orthopnea, pnd, pedal edema. All other systems negative.   Vital Signs:  Patient profile:   45 year old male Height:      71 inches Weight:      230 pounds Pulse rate:   65 / minute BP sitting:   121 / 80  (left arm) Cuff size:   large  Vitals Entered By: Carlye Grippe (December 01, 2010 10:49 AM)  Physical Exam  Additional Exam:  GEN: 45 year old male, sitting upright, no distress HEENT: NCAT,PERRLA,EOMI NECK: palpable pulses, no bruits; no JVD; no TM LUNGS: CTA bilaterally HEART: RRR (S1S2); no significant murmurs; no rubs; no gallops ABD: soft, NT; intact BS EXT: intact distal pulses; no edema SKIN: warm, dry; wound incision site stable, no hematoma or ecchymosis MUSC: no obvious deformity NEURO: A/O (x3)     EKG  Procedure date:  12/01/2010  Findings:      normal sinus rhythm with ventricular bigeminy at 75 bpm; normal axis; no acute changes   ICD Specifications Following MD:  Hillis Range, MD     ICD Vendor:  St Jude     ICD Model Number:  [phone]     ICD Serial Number:  [id number] ICD DOI:  11/11/2010     ICD Implanting MD:  Hillis Range, MD  Lead 1:    Location: RA     DOI: 11/11/2010     Model #: [id number]     Serial #: [id number]     Status: active Lead 2:    Location: RV     DOI: 11/11/2010     Model #: [id number]     Serial #: [id number]     Status: active  Indications::  ICM    ICD Follow Up ICD Dependent:  No      Episodes Coumadin:  No  Brady Parameters Mode DDI     Lower Rate Limit:  40      Tachy Zones VF:  222     VT:  179     Impression &  Recommendations:  Problem # 1:  CARDIOMYOPATHY (ICD-425.4)  the patient is clinically stable, with no signs or symptoms suggestive of decompensated heart failure. He is status post successful placement of a Medtronic ICD device, earlier this month, and is scheduled to return here to the device clinic to follow up with Dr. Johney Frame  in April. He presents with no noted complications, and no firing of his device.  Medication adjustments will consist of up titration of carvedilol to 12.5 mg b.i.d. ( patient reportedly currently taking 6.25 b.i.d.). We will have patient return for early follow for additional titration. At that time, we will consider addition of Aldactone to his medication regimen, in substitution of his current furosemide. We will check baseline metabolic profile, as well as a fasting lipid profile. Patient has previously documented creatinine levels of 1.5, but with normal GFR.  Problem # 2:  ATRIAL FIBRILLATION (ICD-427.31)  no documented recurrent episodes. We'll continue to monitor closely. Patient remains on full dose aspirin.  Problem # 3:  CHF (ICD-428.0)  clinically stable and euvolemic, both by history it and physical examination. Patient has gained 10 pounds, but this is attributed to increased appetite.  Problem # 4:  MITRAL REGURGITATION, MODERATE (ICD-396.3) Assessment: Comment Only  Other Orders: EKG w/ Interpretation (93000) T-Basic Metabolic Panel (18841-66063) T-Lipid Profile (01601-09323)  Patient Instructions: 1)  Follow up with Dr. Myrtis Ser on Tuesday, January 11, 2011 at 2:30pm. 2)  Your physician recommends that you go to the [facility] for lab work: Do not eat or drink after midnight. 3)  Call the office today with correct dose of Coreg (carvedilol). (Double current dose)  Appended Document: 6 WK F/U PER 12/5 OV-JM Felicia left message on my voicemail to confirm pt's carvedilol is 6.25mg  two times a day. He will increase to 12.5mg  by mouth two times a day.  Left message to notify of this change.   Clinical Lists Changes  Medications: Changed medication from COREG 6.25 MG TABS (CARVEDILOL) Take 1/2 tablet by mouth twice a day to CARVEDILOL 12.5 MG TABS (CARVEDILOL) Take one tablet by mouth twice a day - Signed Rx of CARVEDILOL 12.5 MG TABS (CARVEDILOL) Take one tablet by mouth twice a day;  #60 x 6;  Signed;  Entered by: Cyril Loosen, RN, BSN;  Authorized by: Nelida Meuse, PA-C;  Method used: Electronically to [facility] [address] [place], [place], [place]  [address], Ph: [phone], Fax: [phone]    Prescriptions: CARVEDILOL 12.5 MG TABS (CARVEDILOL) Take one tablet by mouth twice a day  #60 x 6   Entered by:   Cyril Loosen, RN, BSN   Authorized  by:   Nelida Meuse, PA-C   Signed by:   Cyril Loosen, RN, BSN on 12/01/2010   Method used:   Electronically to        [facility] (retail)       [address]       [place], [place]  [address]       Ph: [phone]       Fax: [phone]   RxID:   [id number]

## 2010-12-09 NOTE — Procedures (Signed)
Summary: wound check.sjm.amber   Current Medications (verified): 1)  Benazepril Hcl 5 Mg Tabs (Benazepril Hcl) .... Take 1 Tablet By Mouth Once A Day 2)  Furosemide 40 Mg Tabs (Furosemide) .... Take One Tablet By Mouth Two Times A Day 3)  Coreg 6.25 Mg Tabs (Carvedilol) .... Take 1/2 Tablet By Mouth Twice A Day 4)  Aspirin Ec 325 Mg Tbec (Aspirin) .... Take One Tablet By Mouth Daily 5)  Simvastatin 20 Mg Tabs (Simvastatin) .... Take One Tablet By Mouth Daily At Bedtime  Allergies (verified): No Known Drug Allergies   ICD Specifications Following MD:  Hillis Range, MD     ICD Vendor:  St Jude     ICD Model Number:  [phone]     ICD Serial Number:  [id number] ICD DOI:  11/11/2010     ICD Implanting MD:  Hillis Range, MD  Lead 1:    Location: RA     DOI: 11/11/2010     Model #: [id number]     Serial #: [id number]     Status: active Lead 2:    Location: RV     DOI: 11/11/2010     Model #: [id number]     Serial #: [id number]     Status: active  Indications::  ICM    ICD Follow Up Remote Check?  No Charge Time:  7.9 seconds     Battery Est. Longevity:  7.8 years Underlying rhythm:  SR ICD Dependent:  No       ICD Device Measurements Atrium:  Amplitude: 4.4 mV, Impedance: 390 ohms, Threshold: 0.5 V at 0.4 msec Right Ventricle:  Amplitude: 11.2 mV, Impedance: 410 ohms, Threshold: 0.75 V at 0.4 msec Shock Impedance: 52 ohms   Episodes MS Episodes:  0     Percent Mode Switch:  0     Coumadin:  No Shock:  0     ATP:  0     Nonsustained:  0     Atrial Pacing:  <1%     Ventricular Pacing:  <1%  Brady Parameters Mode DDI     Lower Rate Limit:  40      Tachy Zones VF:  222     VT:  179     Next Cardiology Appt Due:  02/06/2011 Tech Comments:  Steri strips removed, no redness or edema noted.  Lead impedance alert values reprogrammed.  ROV  3 months with Dr. Johney Frame in [place]. Altha Harm, LPN  November 22, 2010 10:32 AM

## 2010-12-09 NOTE — Miscellaneous (Signed)
  Clinical Lists Changes  Problems: Removed problem of * ICD CONSIDERATION Added new problem of * ICD Observations: Added new observation of PAST MED HX: MITRAL REGURGITATION, MODERATE (ICD-396.3)...echo 10/2008...  /   TEE...11/17/2008.Marland Kitchenno LV clot /  mild to moderate mitral regurgitation..... echo.... January, 2011  /  mild-to-moderate.Marland Kitchen echo .Marland KitchenNovember, 2011 CHF   systolic Cardiomyopathy  nonischemic    ..( presumed nonischemic.. 2009)...  /   cardiac catheterization.  August 19, 2010....normal coronary arteries  EF 35%..echo.....10/2008...multiple wall motion abnormalities /  TEE  Jan/2010   /    EF 30-35%... echo... November 16, 2009... moderate left ventricular dilatation /   EF 20-25%..echo... September 13, 2010 mild-to-moderate mitral regurgitation.. normal RV function... PA pressure 45 mm mercury ICD   November 11, 2010  St. Jude  Dr.Allred  (11/30/2010 17:41) Added new observation of REFERRING MD: Hillis Range, MD (11/30/2010 17:41) Added new observation of PRIMARY MD: Ernestine Conrad, MD (11/30/2010 17:41)       Past History:  Past Medical History: MITRAL REGURGITATION, MODERATE (ICD-396.3)...echo 10/2008...  /   TEE...11/17/2008.Marland Kitchenno LV clot /  mild to moderate mitral regurgitation..... echo.... January, 2011  /  mild-to-moderate.Marland Kitchen echo .Marland KitchenNovember, 2011 CHF   systolic Cardiomyopathy  nonischemic    ..( presumed nonischemic.. 2009)...  /   cardiac catheterization.  August 19, 2010....normal coronary arteries  EF 35%..echo.....10/2008...multiple wall motion abnormalities /  TEE  Jan/2010   /    EF 30-35%... echo... November 16, 2009... moderate left ventricular dilatation /   EF 20-25%..echo... September 13, 2010 mild-to-moderate mitral regurgitation.. normal RV function... PA pressure 45 mm mercury ICD   November 11, 2010  St. Jude  Dr.Allred

## 2010-12-13 ENCOUNTER — Encounter: Payer: Self-pay | Admitting: Physician Assistant

## 2010-12-15 ENCOUNTER — Telehealth (INDEPENDENT_AMBULATORY_CARE_PROVIDER_SITE_OTHER): Payer: Self-pay | Admitting: *Deleted

## 2010-12-15 NOTE — Cardiovascular Report (Signed)
Summary: Office Visit   Office Visit   Imported By: Roderic Ovens 12/06/2010 16:08:58  _____________________________________________________________________  External Attachment:    Type:   Image     Comment:   External Document

## 2010-12-15 NOTE — Letter (Signed)
Summary: [facility] Discharge Summary  [facility] Discharge Summary   Imported By: Earl Many 12/09/2010 17:33:23  _____________________________________________________________________  External Attachment:    Type:   Image     Comment:   External Document

## 2010-12-15 NOTE — Cardiovascular Report (Signed)
Summary: Pre Op Orders   Pre Op Orders   Imported By: Roderic Ovens 12/03/2010 11:59:36  _____________________________________________________________________  External Attachment:    Type:   Image     Comment:   External Document

## 2010-12-15 NOTE — Progress Notes (Signed)
  Phone Note Other Incoming   Request: Send information Summary of Call: Received request for document to be completed by Physician. Request forwarded to Miss Elease Hashimoto.

## 2010-12-16 ENCOUNTER — Telehealth (INDEPENDENT_AMBULATORY_CARE_PROVIDER_SITE_OTHER): Payer: Self-pay | Admitting: *Deleted

## 2010-12-23 NOTE — Progress Notes (Signed)
----   Converted from flag ---- Flag Received  ---- 12/16/2010 11:12 AM, Bary Leriche wrote: Tresa Endo  the physicians statement is on the way to you today, for Adam [name]   [birth year]ob [birth year].  I know Dr. will be in today, I will see to it that he gets it today.  Patricia  @ [employer]. ------------------------------

## 2010-12-23 NOTE — Progress Notes (Signed)
Summary: checking on paper work/**LM/nm/2ND CALL  Phone Note Call from Patient Call back at Home Phone [phone] Call back at [phone]   Caller: Patient Summary of Call: Pt calling to check on paper work Initial call taken by: Judie Grieve,  December 08, 2010 2:30 PM  Follow-up for Phone Call        [phone] Atn Emi Holes  re fax to her  Pt aware Bradly Chris gave me fax #  Dennis Bast, RN, BSN  December 08, 2010 2:55 PM pt is call [facility] and having them refax paper work Dennis Bast, RN, BSN  December 08, 2010 3:07 PM  Pt calling back regarding his paper work Judie Grieve  December 13, 2010 8:37 AM  Pt calling back checking on paper work for [facility] [phone] Judie Grieve  December 13, 2010 4:16 PM  Called pt. at [phone]. LMTCB.   Follow-up by: Ollen Gross, RN, BSN,  December 13, 2010 4:40 PM  Additional Follow-up for Phone Call Additional follow up Details #1::        PT CALLING BACK RE DISABLILTY PAPER. PT # [phone] OR [phone] Additional Follow-up by: Roe Coombs,  December 14, 2010 8:24 AM    Additional Follow-up for Phone Call Additional follow up Details #2::    lmom for Lenox Ahr in [facility] to find out what is going on with patients paperwork as I do not handle this Dennis Bast, RN, BSN  December 14, 2010 4:37 PM spoke with Dennie Bible in [place] they have gotten form faxed from insurance company.  She does not know if anyone from her department has called the patient in regards to needing a release.  Spoke with Bennie Dallas and she is going to look into this for me and have someone call the patient and get release signed. Dennis Bast, RN, BSN  December 15, 2010 9:48 AM

## 2010-12-23 NOTE — Progress Notes (Signed)
  Phone Note Other Incoming   Request: Send information Summary of Call: Received request for document to be completed by Physician. Request forwarded to Miss Elease Hashimoto.  [employer].     Appended Document:  Spoke with pt, He is going to [employer] to complet ROI that I have faxed over to Attention Lucendia Herrlich( whom I called a let her know pt was coming) once completed Lucendia Herrlich will fax back to me so I can get to [facility].I have aslo received a 3RD request from [facility]  Appended Document:  Received Pt's ROI back via fax from [place]...put all in Envelope and sent to [facility]

## 2010-12-23 NOTE — Progress Notes (Signed)
   _____________________________________________________________________  External Attachment:    Type:   Image     Comment:   External DocumentFaxed [employer] papers,Allred/Katz OV Notes,Med List,& ROI  over to [facility] @ [phone] see Attached Document  [facility]  December 16, 2010 1:25 PM.

## 2010-12-24 ENCOUNTER — Telehealth: Payer: Self-pay | Admitting: Internal Medicine

## 2010-12-24 ENCOUNTER — Encounter (INDEPENDENT_AMBULATORY_CARE_PROVIDER_SITE_OTHER): Payer: Self-pay | Admitting: *Deleted

## 2010-12-29 NOTE — Progress Notes (Signed)
Summary: need note to return to work  Phone Note Call from Patient Call back at [employer] [phone]   Caller: Mom Summary of Call: Pt going back to work on the 12/28/10 and pt need a note to return to work want it faxed to the [place] office so he can pick up there.need today Initial call taken by: Judie Grieve,  December 24, 2010 10:14 AM  Follow-up for Phone Call        I talked with pt--he is an estabished pt of Dr Myrtis Ser in [place]--Dr Theresea Trautmann  is not in [facility] today and is scheduled to be in [place] on Monday 12/27/10--pt is requesting note to return to work 12/28/10--I talked with Lucendia Herrlich in the [place] office--she is aware  that pt will followup with the [place] office about note to return to work--I discussed with pt

## 2010-12-29 NOTE — Progress Notes (Signed)
Summary: Return to work  Phone Note Other Estate agent of Call: Patient called Ginette Otto for a return to work note for [employer]. [address] called here and ask that Dr. Myrtis Ser, Dr. Johney Frame or Gene write this note for him. Patient would like to pick up today. Initial call taken by: Dorise Hiss,  December 24, 2010 11:29 AM  Follow-up for Phone Call        Accordnig to d/c summary from [facility] pt can return to work w/o restrictions on February 20th. A note will be give to him stating this. Follow-up by: Cyril Loosen, RN, BSN,  December 24, 2010 11:46 AM

## 2010-12-29 NOTE — Letter (Signed)
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 [address]   [place], [place] [address]   Phone: [phone]  Fax: [phone]    12/24/2010  TO: Leodis Sias IT MAY CONCERN   RE: Adam [name] [address] [id number]   The above named individual is under my medical care and may return to work on: December 27, 2010 without restrictions.  If you have any further questions or need additional information, please call.     Sincerely,    Cyril Loosen, RN, BSN for Dr. Hillis Range [facility] at [facility]

## 2011-01-11 ENCOUNTER — Ambulatory Visit (INDEPENDENT_AMBULATORY_CARE_PROVIDER_SITE_OTHER): Payer: BC Managed Care – PPO | Admitting: Cardiology

## 2011-01-11 ENCOUNTER — Encounter (INDEPENDENT_AMBULATORY_CARE_PROVIDER_SITE_OTHER): Payer: Self-pay | Admitting: *Deleted

## 2011-01-11 ENCOUNTER — Encounter: Payer: Self-pay | Admitting: Cardiology

## 2011-01-11 DIAGNOSIS — I428 Other cardiomyopathies: Secondary | ICD-10-CM

## 2011-01-18 NOTE — Assessment & Plan Note (Signed)
Summary: 1 MO F/U PER 1/25 OV W/GENE/DR. [name]   Visit Type:  Follow-up Referring Provider:  Hillis Range, MD Primary Provider:  Ernestine Conrad, MD  CC:  cardiomyopathy.  History of Present Illness: The patient is seen for cardiology followup.  He actually is feeling well.  He is working.  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  I have reviewed the specifics of the activity that he is doing at work at this time and I am in favor.  I had a very long discussion with him today about exercise in general.  I told him that I do not want him straining it is exercise.  I want him to walk but not run at this point.  He is also asked me about his sexual activity.  Certainly there is no problem with his having active sexual life.  He and I will discuss the question of Viagra further.  He has not had any palpitations or chest pain.  Current Medications (verified): 1)  Benazepril Hcl 5 Mg Tabs (Benazepril Hcl) .... Take 1 Tablet By Mouth Once A Day 2)  Furosemide 40 Mg Tabs (Furosemide) .... Take One Tablet By Mouth Two Times A Day 3)  Carvedilol 12.5 Mg Tabs (Carvedilol) .... Take One Tablet By Mouth Twice A Day 4)  Aspirin Ec 325 Mg Tbec (Aspirin) .... Take One Tablet By Mouth Daily 5)  Lipitor 20 Mg Tabs (Atorvastatin Calcium) .... Take One Tablet By Mouth Daily.  Allergies (verified): No Known Drug Allergies  Past History:  Past Medical History: MITRAL REGURGITATION, MODERATE (ICD-396.3)...echo 10/2008...  /   TEE...11/17/2008.Marland Kitchenno LV clot /  mild to moderate mitral regurgitation..... echo.... January, 2011  /  mild-to-moderate.Marland Kitchen echo .Marland KitchenNovember, 2011 CHF   systolic Cardiomyopathy  nonischemic    ..( presumed nonischemic.. 2009)...  /   cardiac catheterization.  August 19, 2010....normal coronary arteries  EF 35%..echo.....10/2008...multiple wall motion abnormalities /  TEE  Jan/2010   /    EF 30-35%... echo... November 16, 2009... moderate left ventricular dilatation /   EF 20-25%..echo...  September 13, 2010 mild-to-moderate mitral regurgitation.. normal RV function... PA pressure 45 mm mercury ICD   November 11, 2010  [employer]  Dr.Allred  Renal insufficiency... creatinine 1.5 (GFR greater than 50)..  Review of Systems       Patient denies fever, chills, headache, sweats, rash, change in vision, change in hearing, chest pain, cough, nausea vomiting, urinary symptoms.  All of the systems are reviewed and are negative.  Vital Signs:  Patient profile:   45 year old male Height:      71 inches Weight:      231 pounds BMI:     32.33 Pulse rate:   60 / minute Resp:     18 per minute BP sitting:   102 / 68  (right arm)  Vitals Entered By: Marrion Coy, CNA (January 11, 2011 2:43 PM)  Physical Exam  General:  patient looks quite good today. Head:  head is atraumatic. Eyes:  no xanthelasma. Neck:  no jugular distention. Chest Wall:  no chest wall tenderness. Lungs:  lungs are clear cardiorespiratory effort is not labored. Heart:  cardiac exam reveals S1 and S2.  No clicks or significant murmurs. Abdomen:  abdomen is soft. Msk:  no musculoskeletal deformities. Extremities:  no peripheral edema. Skin:  no skin rashes. Psych:  patient is oriented to person time and place.  Affect is normal.    ICD Specifications Following MD:  Hillis Range,  MD     ICD Vendor:  St Jude     ICD Model Number:  [id number]     ICD Serial Number:  [id number] ICD DOI:  11/11/2010     ICD Implanting MD:  Hillis Range, MD  Lead 1:    Location: RA     DOI: 11/11/2010     Model #: [id number]     Serial #: [id number]     Status: active Lead 2:    Location: RV     DOI: 11/11/2010     Model #: [id number]     Serial #: [id number]     Status: active  Indications::  ICM    ICD Follow Up ICD Dependent:  No      Episodes Coumadin:  No  Brady Parameters Mode DDI     Lower Rate Limit:  40      Tachy Zones VF:  222     VT:  179     Impression & Recommendations:  Problem # 1:  * ICD The patient's ICD is in place and  working well.  We need to arrange for him to have his home device.  Problem # 2:  ATRIAL FIBRILLATION (ICD-427.31) There's been no suggestion of any recurrent atrial fibrillation.  Problem # 3:  CARDIOMYOPATHY (ICD-425.4) When. patient's carvedilol dose was increased recently, he thought that he had some headaches.  His blood pressure now is 102/68 and his pulse 60.  On this basis I am not inclined to push his meds further today.  Problem # 4:  * SEXUAL ACTIVITY At this point I have encouraged the patient to proceed with his sexual activity.  I will review with our heart failure specialist whether there is any reason he cannot use Viagra.  He does not use nitroglycerin and he is not on any type of nitroglycerin preparation.  He has normal coronary arteries.  Patient Instructions: 1)  Follow up with Dr. Myrtis Ser on  [facility], Mar 17, 2011 AT 1PM. 2)  Your physician recommends that you continue on your current medications as directed. Please refer to the Current Medication list given to you today.

## 2011-01-18 NOTE — Letter (Signed)
Summary: [employer]	[place]. [address]   [place], [place] [address]   Phone: [phone]  Fax: [phone]     January 11, 2011    Adam [name]   The above named patient had a medical visit today at: 2:30 pm.  Please take this into consideration when reviewing the time away from work/school.      Sincerely yours,  Architectural technologist

## 2011-01-20 LAB — POCT I-STAT 3, ART BLOOD GAS (G3+)
Acid-Base Excess: 1 mmol/L (ref 0.0–2.0)
Bicarbonate: 27.6 mEq/L — ABNORMAL HIGH (ref 20.0–24.0)
O2 Saturation: 93 %
TCO2: 29 mmol/L (ref 0–100)
pCO2 arterial: 49.2 mmHg — ABNORMAL HIGH (ref 35.0–45.0)
pH, Arterial: 7.356 (ref 7.350–7.450)
pO2, Arterial: 71 mmHg — ABNORMAL LOW (ref 80.0–100.0)

## 2011-01-20 LAB — POCT I-STAT 3, VENOUS BLOOD GAS (G3P V)
Bicarbonate: 26.8 mEq/L — ABNORMAL HIGH (ref 20.0–24.0)
O2 Saturation: 64 %
TCO2: 28 mmol/L (ref 0–100)
pCO2, Ven: 50.5 mmHg — ABNORMAL HIGH (ref 45.0–50.0)
pH, Ven: 7.333 — ABNORMAL HIGH (ref 7.250–7.300)
pO2, Ven: 36 mmHg (ref 30.0–45.0)

## 2011-01-31 ENCOUNTER — Telehealth: Payer: Self-pay | Admitting: *Deleted

## 2011-01-31 NOTE — Telephone Encounter (Signed)
Pt states Dr. Myrtis Ser was suppose to give him a number of another doctor who deals with what he can or can't  do as far as exercise (lifting weights, etc). Pt has never gotten a call with this MD's number.

## 2011-02-01 NOTE — Telephone Encounter (Signed)
I had spoken with the patient at his last visit about his level of exercise.  He knows that he needs to do things in moderation.  The actual question that I was going to ask another physician was about whether he can use Viagra.  I have reviewed this with Dr. Gala Romney from the heart failure perspective.  He said that he definitely can use Viagra.  Also we do not need to limit the frequency of use.  Of course we have to be sure that he is not on hydralazine or nitrates.

## 2011-02-01 NOTE — Telephone Encounter (Signed)
Left message to call back on voice mail

## 2011-02-02 NOTE — Telephone Encounter (Signed)
Pt notified and verbalized understanding. Pt will discuss Viagra w/primary MD. He will have primary MD contact our office if they need clearance for this.

## 2011-03-16 ENCOUNTER — Encounter: Payer: Self-pay | Admitting: Cardiology

## 2011-03-16 ENCOUNTER — Encounter: Payer: Self-pay | Admitting: *Deleted

## 2011-03-16 DIAGNOSIS — N289 Disorder of kidney and ureter, unspecified: Secondary | ICD-10-CM | POA: Insufficient documentation

## 2011-03-16 DIAGNOSIS — I340 Nonrheumatic mitral (valve) insufficiency: Secondary | ICD-10-CM | POA: Insufficient documentation

## 2011-03-16 DIAGNOSIS — R9430 Abnormal result of cardiovascular function study, unspecified: Secondary | ICD-10-CM | POA: Insufficient documentation

## 2011-03-16 DIAGNOSIS — I429 Cardiomyopathy, unspecified: Secondary | ICD-10-CM | POA: Insufficient documentation

## 2011-03-17 ENCOUNTER — Encounter: Payer: Self-pay | Admitting: *Deleted

## 2011-03-17 ENCOUNTER — Encounter: Payer: Self-pay | Admitting: Cardiology

## 2011-03-17 ENCOUNTER — Ambulatory Visit (INDEPENDENT_AMBULATORY_CARE_PROVIDER_SITE_OTHER): Payer: BC Managed Care – PPO | Admitting: Cardiology

## 2011-03-17 DIAGNOSIS — I059 Rheumatic mitral valve disease, unspecified: Secondary | ICD-10-CM

## 2011-03-17 DIAGNOSIS — I340 Nonrheumatic mitral (valve) insufficiency: Secondary | ICD-10-CM

## 2011-03-17 DIAGNOSIS — I428 Other cardiomyopathies: Secondary | ICD-10-CM

## 2011-03-17 DIAGNOSIS — Z79899 Other long term (current) drug therapy: Secondary | ICD-10-CM

## 2011-03-17 DIAGNOSIS — I509 Heart failure, unspecified: Secondary | ICD-10-CM

## 2011-03-17 DIAGNOSIS — N289 Disorder of kidney and ureter, unspecified: Secondary | ICD-10-CM

## 2011-03-17 MED ORDER — SPIRONOLACTONE 12.5 MG HALF TABLET: 12.5000 mg | ORAL_TABLET | Freq: Every day | ORAL | Status: DC

## 2011-03-17 NOTE — Patient Instructions (Signed)
Follow up as scheduled. Start Aldactone (spironolactone) 12.5mg  (1/2 of a 25mg  tablet) daily. Your physician recommends that you go to the [facility] for lab work: [employer]. DO IN 7 DAYS.

## 2011-03-17 NOTE — Assessment & Plan Note (Signed)
Overall he is doing very well.  SI and titrated his medications in the past I had not added spironolactone.  He has a creatinine 1.5.  His GFR is greater than 50.  His potassium is 4.5.  He is not on potassium supplements.  12.5 mg of spironolactone which started today we will follow up carefully with a chemistry study in one week.  I will plan to see him back for cardiology followup in 3 months.

## 2011-03-17 NOTE — Progress Notes (Deleted)
note

## 2011-03-17 NOTE — Assessment & Plan Note (Signed)
Renal function will be followed with one-week followup after spironolactone started

## 2011-03-17 NOTE — Assessment & Plan Note (Signed)
His volume status is very stable. No change in therapy. 

## 2011-03-17 NOTE — Assessment & Plan Note (Signed)
There is no need for followup echo at this time.  His mitral regurgitation will be followed over time.

## 2011-03-17 NOTE — Progress Notes (Signed)
HPI The patient is seen today for followup cardiomyopathy.  Exactly doing very well.  Since his last visit I did call to tell him that it would be okay for him to use Viagra.  It turns out that this class of medicine may in fact be beneficial for patients with cardiomyopathy.  He is used the drug once or twice and done well.  He is not having any significant chest pain or shortness of breath.  .No Known Allergies   Current Outpatient Prescriptions  Medication Sig Dispense Refill  . aspirin 325 MG tablet Take 325 mg by mouth daily.        Marland Kitchen atorvastatin (LIPITOR) 20 MG tablet Take 20 mg by mouth daily.        . benazepril (LOTENSIN) 5 MG tablet Take 5 mg by mouth daily.        . carvedilol (COREG) 12.5 MG tablet Take 12.5 mg by mouth 2 (two) times daily with a meal.        . furosemide (LASIX) 40 MG tablet Take 40 mg by mouth 2 (two) times daily.          History   Social History  . Marital Status: Single    Spouse Name: N/A    Number of Children: N/A  . Years of Education: N/A   Occupational History  . [employer]    Social History Main Topics  . Smoking status: Never Smoker   . Smokeless tobacco: Not on file  . Alcohol Use: Yes     OCCASIONAL  (NOT HEAVY)  . Drug Use: Not on file  . Sexually Active: Not on file   Other Topics Concern  . Not on file   Social History Narrative  . No narrative on file    Family History  Problem Relation Age of Onset  . Heart attack Father   . Heart attack Brother   . Hypertension Mother     Past Medical History  Diagnosis Date  . Mitral regurgitation     Mild to moderate, echo, November, 2011  . Cardiomyopathy     Nonischemic / catheterization October, 2011, normal coronary arteries  . CHF (congestive heart failure)     Systolic  . Ejection fraction < 50%     EF 35%, 2009 / EF 30-35%, January, 2011 / EF 20-25%, echo, November, 2011 , PA pressure 45 mmHg, normal RV function, mild to moderate mitral regurgitation  . ICD (implantable  cardiac defibrillator) battery depletion     St. Jude, Dr.Allred  . Renal insufficiency     Creatinine 1.5 (GFR greater than 50)    No past surgical history on file.  ROS  Patient denies fever, chills, headache, sweats, rash, change in vision, change in hearing, chest pain, cough, nausea vomiting, urinary symptoms.  All other systems are reviewed and are negative.  PHYSICAL EXAM Patient is oriented to person time and place.  Affect is normal.  There is no xanthelasma.  There is no jugular venous distention.  Lungs are clear.  Respiratory effort is unlabored.  Cardiac exam reveals an S1-S2.  No clicks or significant murmurs.  The abdomen is soft.  There is no peripheral edema.  There are no musculoskeletal deformities.  There are no skin rashes. Filed Vitals:   03/17/11 1259  BP: 126/83  Pulse: 48  Height: 5\' 11"  (1.803 m)  Weight: 237 lb (107.502 kg)  SpO2: 99%    EKG  EKG is done today and reviewed by me.  There is sinus bradycardia with nonspecific ST-T wave changes.  EKG is unchanged.  ASSESSMENT & PLAN

## 2011-03-22 ENCOUNTER — Telehealth: Payer: Self-pay | Admitting: *Deleted

## 2011-03-22 NOTE — Assessment & Plan Note (Signed)
[facility]                          [facility] NOTE   NAME:Adam [name], VENTURA HOLLENBECK                      MRN:          [id number]  DATE:11/03/2008                            DOB:          1966-10-21    PRIMARY CARDIOLOGIST:  Luis Abed, MD, [facility]   REASON FOR VISIT:  Post-hospital followup.   Mr. Cardell presents to our clinic for the first time, following recent  brief hospitalization here at [facility].  He presented with new onset,  acute systolic heart failure and was referred to [place] in consultation.  An  echocardiogram indicated an EF of approximately 35%, with multiple wall  motion abnormalities, and question of possible apical thrombus.  Moderate mitral regurgitation was also noted.   We recommended transfer to [facility] so as to proceed with a right/left  coronary angiogram, with no LV gram.  The patient, however, declined for  any invasive workup at that time, but did agree to follow up with [place]  here in the clinic.  He now presents in followup.   Mr. Detwiler reports no recurrent shortness of breath.  He continues to  deny any exertional angina pectoris.  He reports compliance with his  medications, and does not smoke.   A followup BMET 1 week following discharge showed stable, chronic renal  insufficiency with a creatinine of 1.7.   Specifically, Mr. Gruenhagen denies any PND, orthopnea, lower extremity  edema, or significant exertional dyspnea.  He denies any dietary sodium  indiscretion.   CURRENT MEDICATIONS:  1. Carvedilol 3.125 b.i.d.  2. Aspirin 325 daily.  3. Simvastatin 40 at bedtime.  4. Furosemide 20 daily.   PHYSICAL EXAMINATION:  VITAL SIGNS:  Blood pressure 125/80, pulse 50,  regular, weight 232.  GENERAL:  A 45 year old male, moderately obese, sitting upright, no  distress.  HEENT:  Normocephalic, atraumatic.  NECK:  Palpable carotid pulse without bruits; unable to assess JVD,  secondary to neck girth.  LUNGS:   Clear to auscultation in all fields.  HEART:  Regular rate and rhythm.  No significant murmurs.  ABDOMEN:  Protuberant, nontender.  EXTREMITIES:  Minimally palpable peripheral pulses with trace pedal  edema.  No focal deficit.   IMPRESSION:  1. Status post acute, new-onset systolic heart failure.      a.     EF 35% with multiple wall motion abnormalities.      b.     Question apical thrombus.  2. Mild renal insufficiency.  3. Moderate mitral regurgitation.  4. Hyperglycemia.      a.     Normal hemoglobin A1c level.   PLAN:  1. Following review with Dr. Simona Huh, plan is to reevaluate for      possible apical thrombus, but this time with a transesophageal      echocardiogram for better visualization.  Of note, with respect to      pursuing more aggressive workup, we feel that we can continue to      defer cardiac catheterization at this point in time, particularly      given that the patient presents with no  symptoms.  Moreover, we      would like to clarify the issue of whether or not he needs to be      committed to Coumadin if, in fact, he does demonstrate evidence of      an apical thrombus.  2. Down titrate aspirin to 162 mg daily, then 81 mg indefinitely.  3. Followup BMET today, for continued close monitoring of electrolytes      and renal function.  4. Schedule early clinic followup with myself and Dr. Myrtis Ser in 1 month,      for review of the TEE results and further recommendations.      Rozell Searing, PA-C  Electronically Signed      Luis Abed, MD, [facility]  Electronically Signed   GS/MedQ  DD: 11/03/2008  DT: 11/04/2008  Job #: [id number]   cc:   Ernestine Conrad, MD

## 2011-03-22 NOTE — Telephone Encounter (Signed)
Message left on nurse's voicemail that [facility] didn't have the correct dose available for the new medication that was prescribed at last visit. Nurse informed wife that medication could be called to a different pharmacy that may possibly have that dose. Patient want rx sent to [facility] if its not available at [facility]. Nurse called [facility] to get explanation of why medication wasn't available. [place] explained to nurse that spironolactone doesn't come in 12.5mg  and it would be almost impossible to make 1/4 tabs from 25mg . Nurse requested that she fax over the information so that we could send to MD. After clarifying correct dose with nurse of Myrtis Ser, the correct dose is 12.5mg  daily 1/2 of the 25mg . Nurse called [facility] and corrected rx to correct dose which is 12.5mg  daily. Patient informed.

## 2011-03-22 NOTE — Assessment & Plan Note (Signed)
[facility]                          [facility] NOTE   NAME:Adam [name], Adam [name]                      MRN:          [id number]  DATE:12/05/2008                            DOB:          04/21/1966    Adam [name] has cardiomyopathy of unknown etiology.  He has been very  hesitant to proceed with catheterization.  We have had many talks about  this.  There was question of an LV clot.  On this basis, he had a TEE.  He has multiple wall motion abnormalities.  He had some spontaneous  contrast, but no clot.  Ejection fraction remains in the 35-40% range.  He is back today.  He is clearly feeling better.  He is taking his  medicines, but he would like to be able to cut them back.   ALLERGIES:  No known drug allergies.   MEDICATIONS:  1. Carvedilol 3.125 b.i.d.  2. Simvastatin 40.  3. Furosemide 20.  4. Aspirin 162.   OTHER MEDICAL PROBLEMS:  See the list below.   REVIEW OF SYSTEMS:  He has no GI or GU symptoms.  He has no fevers or  chills.  There are no skin rashes.  He has no headache.  Otherwise,  review of systems is negative.   PHYSICAL EXAMINATION:  VITAL SIGNS:  Blood pressure is 123/84 with a  pulse of 56.  GENERAL:  The patient is oriented to person, time, and place.  Affect is  normal.  HEENT:  No xanthelasma.  He has normal extraocular motion.  NECK:  There are no carotid bruits.  There is no jugular venous  distention.  LUNGS:  Clear.  Respiratory effort is not labored.  CARDIAC:  S1 with an S2.  There are no clicks or significant murmurs.  ABDOMEN:  Soft.  EXTREMITIES:  He has no peripheral edema.   No labs were done today.   PROBLEMS:  1. History of new diagnosis of acute systolic heart failure.  I am      doing everything I can to get him onto the appropriate medications.      I have had long discussions with him and I have convinced him to      begin to use an ACE inhibitor today.  2. Moderate mitral regurgitation.  3.  Multiple wall motion abnormalities.  I am quite concerned about      this, but he is not having      symptoms, and he at this point is not willing to proceed with      catheterization.  He will be followed carefully.   Today, we will add an ACE.  I will then be seeing him back in followup  in 3 months.     Adam Abed, MD, [facility]  Electronically Signed    JDK/MedQ  DD: 12/05/2008  DT: 12/06/2008  Job #: [id number]

## 2011-03-30 ENCOUNTER — Telehealth: Payer: Self-pay | Admitting: *Deleted

## 2011-03-30 MED ORDER — SPIRONOLACTONE 25 MG PO TABS: 12.5000 mg | ORAL_TABLET | Freq: Every day | ORAL | Status: DC

## 2011-03-30 NOTE — Telephone Encounter (Addendum)
Pt's fiance left message on voicemail asking for a return call regarding Spironolactone.  Returned call to pt's fiance. She states she's not at home so she cannot verify the dose but pt is having headaches everytime he takes the spironolactone that was started at last office visit. She states that he is not able to cut the pill in half b/c it is too small. He is suppose to be on 1/2 of a 25mg  tablet (12.5mg ). She states whenever he takes the spironolactone he gets a headache. She would like to know what he should do regarding this medication.  (Also need to notify pt/finace regarding results of labs.)

## 2011-03-31 NOTE — Telephone Encounter (Signed)
Stop the medication if it is causing headaches

## 2011-04-01 NOTE — Telephone Encounter (Signed)
Felicia notified and verbalized understanding.

## 2011-04-01 NOTE — Telephone Encounter (Signed)
Left message to call back on voice mail

## 2011-06-09 ENCOUNTER — Encounter: Payer: Self-pay | Admitting: Cardiology

## 2011-06-20 ENCOUNTER — Ambulatory Visit (INDEPENDENT_AMBULATORY_CARE_PROVIDER_SITE_OTHER): Payer: BC Managed Care – PPO | Admitting: Cardiology

## 2011-06-20 ENCOUNTER — Encounter: Payer: Self-pay | Admitting: Cardiology

## 2011-06-20 DIAGNOSIS — R400 Somnolence: Secondary | ICD-10-CM

## 2011-06-20 DIAGNOSIS — R519 Headache, unspecified: Secondary | ICD-10-CM | POA: Insufficient documentation

## 2011-06-20 DIAGNOSIS — R51 Headache: Secondary | ICD-10-CM | POA: Insufficient documentation

## 2011-06-20 DIAGNOSIS — Z4502 Encounter for adjustment and management of automatic implantable cardiac defibrillator: Secondary | ICD-10-CM

## 2011-06-20 DIAGNOSIS — R404 Transient alteration of awareness: Secondary | ICD-10-CM

## 2011-06-20 DIAGNOSIS — N289 Disorder of kidney and ureter, unspecified: Secondary | ICD-10-CM

## 2011-06-20 DIAGNOSIS — I428 Other cardiomyopathies: Secondary | ICD-10-CM

## 2011-06-20 NOTE — Assessment & Plan Note (Signed)
This is a new problem today.  I believe it is not related to his cardiac meds.  I feel that no further workup is needed.

## 2011-06-20 NOTE — Assessment & Plan Note (Signed)
This is a new problem today.  It appears related to spironolactone.  Spironolactone has been stopped.

## 2011-06-20 NOTE — Assessment & Plan Note (Signed)
Overall stable today.  We will not use spironolactone because he had headaches.  In the future we will decide if eplerinone

## 2011-06-20 NOTE — Assessment & Plan Note (Signed)
No evidence of any ICD discharges.  No further workup.

## 2011-06-20 NOTE — Progress Notes (Signed)
HPI Patient is seen today for followup of cardiomyopathy.  I had started him on sternal lactone.  It was titrated up carefully and we're watching his potassium and renal function.  The renal function remained stable but the patient developed headaches.  When he stopped the medication his headaches improved.  Therefore spironolactone will not he is going forward.  Patient has also had some sleepiness during the day.  He takes a 10 minute nap at his lunch break.  When he gets home in the evening he is fatigued and falls asleep rapidly.  Is not having chest pain.  There's no signs of congestive heart failure. No Known Allergies  Current Outpatient Prescriptions  Medication Sig Dispense Refill  . aspirin 325 MG tablet Take 325 mg by mouth daily.        Marland Kitchen atorvastatin (LIPITOR) 20 MG tablet Take 20 mg by mouth daily.        . benazepril (LOTENSIN) 5 MG tablet Take 5 mg by mouth daily.        . carvedilol (COREG) 12.5 MG tablet Take 12.5 mg by mouth 2 (two) times daily with a meal.        . furosemide (LASIX) 40 MG tablet Take 40 mg by mouth 2 (two) times daily.          History   Social History  . Marital Status: Single    Spouse Name: N/A    Number of Children: 5  . Years of Education: N/A   Occupational History  . [employer]     Full time   Social History Main Topics  . Smoking status: Never Smoker   . Smokeless tobacco: Not on file  . Alcohol Use: Yes     OCCASIONAL  (NOT HEAVY)  . Drug Use: Not on file  . Sexually Active: Not on file   Other Topics Concern  . Not on file   Social History Narrative   Lives in [place], [place] with fiance.No regular exercise.    Family History  Problem Relation Age of Onset  . Heart attack Father 15    Multiple MIs  . Heart attack Brother   . Heart failure Brother     CHF  . Hypertension Mother   . Heart attack Paternal Grandfather     Past Medical History  Diagnosis Date  . Mitral regurgitation     Mild to moderate, echo, November, 2011  .  Cardiomyopathy     Nonischemic / catheterization October, 2011, normal coronary arteries  . CHF (congestive heart failure)     Systolic  . Ejection fraction < 50%     EF 35%, 2009 / EF 30-35%, January, 2011 moderate left ventricular dilation / EF 20-25%, echo, November, 2011 , PA pressure 45 mmHg, normal RV function, mild to moderate mitral regurgitation  . ICD (implantable cardiac defibrillator) battery depletion 11/11/10    St. Jude, Dr.Allred  . Renal insufficiency     Creatinine 1.5 (GFR greater than 50)  . Headache     Patient seen to have headaches from spironolactone.  Drug was stopped, August, 2012  . Sleepiness     has some sleepiness during the day    Past Surgical History  Procedure Date  . Tee with cardioversion 11/17/08    No LV clot    ROS  Patient denies fever, chills, headache, sweats, rash, change in vision, change in hearing, chest pain, cough, nausea vomiting, urinary symptoms.  All other systems are reviewed and are negative.  PHYSICAL EXAM Patient looks quite good.  He is oriented to person time and place.  Affect is normal.  Head is atraumatic.  There's no xanthelasma.  Lungs are clear very respiratory effort is not labored.  There is no jugular venous distention.  Cardiac exam reveals a small S2.  No clicks or significant murmurs.  The abdomen is soft.  There is no peripheral edema.  There are no musculoskeletal deformities there are no skin rashes. Filed Vitals:   06/20/11 0857  BP: 142/91  Pulse: 56  Height: 5\' 10"  (1.778 m)  Weight: 238 lb 12.8 oz (108.319 kg)    EKG Is not done today.  ASSESSMENT & PLAN

## 2011-06-20 NOTE — Patient Instructions (Signed)
Follow up as scheduled. Your physician recommends that you continue on your current medications as directed. Please refer to the Current Medication list given to you today. 

## 2011-06-20 NOTE — Assessment & Plan Note (Signed)
Renal function was stable on his recent labs.  No change in therapy.

## 2011-07-05 ENCOUNTER — Encounter: Payer: Self-pay | Admitting: *Deleted

## 2011-08-23 ENCOUNTER — Telehealth: Payer: Self-pay | Admitting: Cardiology

## 2011-08-23 NOTE — Telephone Encounter (Signed)
Adam [name] needs dental surgery and the dentist is requesting that Adam [name] come off his medications For 5 days. He is scheduled for 08-29-2011. Was told to contact Dr. Myrtis Ser office to see if he would approve This.  Please call [facility] Dentist [phone].

## 2011-08-23 NOTE — Telephone Encounter (Signed)
They would like to know if pt can hold ASA for 5 days prior to dental extraction.  Note can be faxed to [phone].

## 2011-08-24 NOTE — Telephone Encounter (Signed)
Okay to hold aspirin. 

## 2011-09-06 ENCOUNTER — Other Ambulatory Visit: Payer: Self-pay | Admitting: Cardiology

## 2011-09-07 ENCOUNTER — Other Ambulatory Visit: Payer: Self-pay | Admitting: *Deleted

## 2011-09-07 MED ORDER — FUROSEMIDE 40 MG PO TABS: 40.0000 mg | ORAL_TABLET | Freq: Two times a day (BID) | ORAL | Status: DC

## 2011-09-07 NOTE — Telephone Encounter (Signed)
rx sent already 

## 2011-09-08 ENCOUNTER — Other Ambulatory Visit: Payer: Self-pay | Admitting: *Deleted

## 2011-09-08 MED ORDER — FUROSEMIDE 40 MG PO TABS: 40.0000 mg | ORAL_TABLET | Freq: Two times a day (BID) | ORAL | Status: DC

## 2011-09-23 ENCOUNTER — Ambulatory Visit: Payer: BC Managed Care – PPO | Admitting: Cardiology

## 2011-09-27 ENCOUNTER — Other Ambulatory Visit: Payer: Self-pay | Admitting: Cardiology

## 2011-09-28 NOTE — Telephone Encounter (Signed)
.   Requested Prescriptions   Pending Prescriptions Disp Refills  . benazepril (LOTENSIN) 5 MG tablet [Pharmacy Med Name: BENAZEPRIL 5MG       TAB] 30 tablet 6    Sig: TAKE ONE TABLET BY MOUTH EVERY DAY

## 2011-10-03 ENCOUNTER — Ambulatory Visit (INDEPENDENT_AMBULATORY_CARE_PROVIDER_SITE_OTHER): Payer: BC Managed Care – PPO | Admitting: Cardiology

## 2011-10-03 ENCOUNTER — Encounter: Payer: Self-pay | Admitting: Cardiology

## 2011-10-03 DIAGNOSIS — I428 Other cardiomyopathies: Secondary | ICD-10-CM

## 2011-10-03 DIAGNOSIS — I509 Heart failure, unspecified: Secondary | ICD-10-CM

## 2011-10-03 MED ORDER — CARVEDILOL 25 MG PO TABS: 25.0000 mg | ORAL_TABLET | Freq: Two times a day (BID) | ORAL | Status: DC

## 2011-10-03 MED ORDER — FUROSEMIDE 40 MG PO TABS: 40.0000 mg | ORAL_TABLET | Freq: Every day | ORAL | Status: DC

## 2011-10-03 NOTE — Progress Notes (Signed)
HPI    Patient is here to follow up cardiomyopathy.  I saw him last in August, 2012.  He did not feel well with spironolactone.  It is not being used at this time.  I have titrated his beta-blockade and ACE inhibitor in the past.  His first measurement of heart rate today is 91.  I repeated it at rest and is 68.  No Known Allergies  Current Outpatient Prescriptions  Medication Sig Dispense Refill  . aspirin 325 MG tablet Take 325 mg by mouth daily.        Marland Kitchen atorvastatin (LIPITOR) 20 MG tablet Take 20 mg by mouth daily.        . benazepril (LOTENSIN) 5 MG tablet TAKE ONE TABLET BY MOUTH EVERY DAY  30 tablet  6  . carvedilol (COREG) 12.5 MG tablet Take 12.5 mg by mouth 2 (two) times daily with a meal.        . furosemide (LASIX) 40 MG tablet Take 1 tablet (40 mg total) by mouth 2 (two) times daily.  60 tablet  6    History   Social History  . Marital Status: Single    Spouse Name: N/A    Number of Children: 5  . Years of Education: N/A   Occupational History  . [employer]     Full time   Social History Main Topics  . Smoking status: Never Smoker   . Smokeless tobacco: Never Used  . Alcohol Use: Yes     OCCASIONAL  (NOT HEAVY)  . Drug Use: Not on file  . Sexually Active: Not on file   Other Topics Concern  . Not on file   Social History Narrative   Lives in [place], [place] with fiance.No regular exercise.    Family History  Problem Relation Age of Onset  . Heart attack Father 32    Multiple MIs  . Heart attack Brother   . Heart failure Brother     CHF  . Hypertension Mother   . Heart attack Paternal Grandfather     Past Medical History  Diagnosis Date  . Mitral regurgitation     Mild to moderate, echo, November, 2011  . Cardiomyopathy     Nonischemic / catheterization October, 2011, normal coronary arteries  . CHF (congestive heart failure)     Systolic  . Ejection fraction < 50%     EF 35%, 2009 / EF 30-35%, January, 2011 moderate left ventricular dilation / EF 20-25%,  echo, November, 2011 , PA pressure 45 mmHg, normal RV function, mild to moderate mitral regurgitation  . ICD (implantable cardiac defibrillator) battery depletion 11/11/10    St. Jude, Dr.Allred  . Renal insufficiency     Creatinine 1.5 (GFR greater than 50)  . Headache     Patient seen to have headaches from spironolactone.  Drug was stopped, August, 2012  . Sleepiness     has some sleepiness during the day    Past Surgical History  Procedure Date  . Tee with cardioversion 11/17/08    No LV clot    ROS    Patient denies fever, chills, headache, sweats, rash, change in vision, change in hearing, chest pain, cough, nausea vomiting, urinary symptoms.  All other systems are reviewed and are negative.  PHYSICAL EXAM   Patient is stable.  He is here with his wife.  Head is atraumatic.  There is no jugular venous distention.  Lungs are clear.  Respiratory effort is not labored.  He is  oriented to person time and place.  Affect is normal.  Cardiac exam reveals S1-S2.  No clicks or significant murmurs.  The abdomen is soft.  There is no peripheral edema.  Filed Vitals:   10/03/11 0923  BP: 116/87  Pulse: 91  Height: 5\' 10"  (1.778 m)  Weight: 243 lb (110.224 kg)     ASSESSMENT & PLAN

## 2011-10-03 NOTE — Patient Instructions (Signed)
   Increase Coreg to 25mg  twice a day   Decrease Lasix to daily Your physician wants you to follow up in: 6 months.  You will receive a reminder letter in the mail one-two months in advance.  If you don't receive a letter, please call our office to schedule the follow up appointment

## 2011-10-03 NOTE — Assessment & Plan Note (Signed)
Patient did not tolerate spironolactone.  His heart rate when he is up and around maybe a little higher than would like.  I will try to titrate his carvedilol to 25 b.i.d. If he tolerates it.  If he feels more tired during the day he cut it back to 12.5.  B.i.d.

## 2011-10-03 NOTE — Assessment & Plan Note (Signed)
CHF is well compensated.  He has some cramping if he takes his Lasix twice a day.  He had been using it only once a day and is stable with this.  I have approved ongoing dosage of Lasix once daily.

## 2011-10-24 ENCOUNTER — Ambulatory Visit (INDEPENDENT_AMBULATORY_CARE_PROVIDER_SITE_OTHER): Payer: BC Managed Care – PPO | Admitting: *Deleted

## 2011-10-24 ENCOUNTER — Encounter: Payer: Self-pay | Admitting: Internal Medicine

## 2011-10-24 DIAGNOSIS — Z4502 Encounter for adjustment and management of automatic implantable cardiac defibrillator: Secondary | ICD-10-CM

## 2011-10-24 DIAGNOSIS — I428 Other cardiomyopathies: Secondary | ICD-10-CM

## 2011-10-24 LAB — ICD DEVICE OBSERVATION
AL AMPLITUDE: 3.2 mv
AL IMPEDENCE ICD: 362.5 Ohm
AL THRESHOLD: 0.75 V
ATRIAL PACING ICD: 1.3 pct
BAMS-0001: 180 {beats}/min
CHARGE TIME: 8.4 s
DEV-0020ICD: NEGATIVE
DEVICE MODEL ICD: 622431
FVT: 0
HV IMPEDENCE: 61 Ohm
MODE SWITCH EPISODES: 0
PACEART VT: 0
RV LEAD AMPLITUDE: 11.2 mv
RV LEAD IMPEDENCE ICD: 375 Ohm
RV LEAD THRESHOLD: 0.75 V
TOT-0006: 20120105000000
TOT-0007: 1
TOT-0008: 0
TOT-0009: 1
TOT-0010: 4
TZON-0003SLOWVT: 335 ms
TZON-0004SLOWVT: 20
TZON-0005SLOWVT: 6
TZON-0010SLOWVT: 40 ms
VENTRICULAR PACING ICD: 0 pct
VF: 0

## 2011-10-24 NOTE — Progress Notes (Signed)
icd check in clinic  

## 2011-11-10 ENCOUNTER — Other Ambulatory Visit: Payer: Self-pay | Admitting: Physician Assistant

## 2011-12-29 ENCOUNTER — Telehealth: Payer: Self-pay | Admitting: *Deleted

## 2011-12-29 NOTE — Telephone Encounter (Signed)
Letter dated 12/24/10 re-faxed to there facility per request.

## 2011-12-30 ENCOUNTER — Telehealth: Payer: Self-pay | Admitting: *Deleted

## 2011-12-30 NOTE — Telephone Encounter (Signed)
Informed by which MD? There is no mention in Dr Henrietta Hoover last office note. And I don't see any contraindication to doing so, if he is not experiencing any exertional CP or significant DOE. However, I would probably restrict the weight limit to 25 lbs.

## 2011-12-30 NOTE — Telephone Encounter (Signed)
Patient called stating that his job is now wanting him to start lifting heavy boxes again and he was informed by MD that he shouldn't do this. Please advise.

## 2012-01-02 ENCOUNTER — Encounter: Payer: Self-pay | Admitting: *Deleted

## 2012-01-02 NOTE — Telephone Encounter (Signed)
Patient informed and will come by office to pick up note.

## 2012-01-13 ENCOUNTER — Encounter: Payer: Self-pay | Admitting: *Deleted

## 2012-01-13 ENCOUNTER — Ambulatory Visit (INDEPENDENT_AMBULATORY_CARE_PROVIDER_SITE_OTHER): Payer: BC Managed Care – PPO | Admitting: Cardiology

## 2012-01-13 ENCOUNTER — Encounter: Payer: Self-pay | Admitting: Cardiology

## 2012-01-13 DIAGNOSIS — R404 Transient alteration of awareness: Secondary | ICD-10-CM

## 2012-01-13 DIAGNOSIS — I428 Other cardiomyopathies: Secondary | ICD-10-CM

## 2012-01-13 DIAGNOSIS — R400 Somnolence: Secondary | ICD-10-CM

## 2012-01-13 MED ORDER — BENAZEPRIL HCL 5 MG PO TABS: 10.0000 mg | ORAL_TABLET | Freq: Every day | ORAL | Status: DC

## 2012-01-13 NOTE — Assessment & Plan Note (Signed)
Patient is doing very well. He does have slight edema at times. I have encouraged him not to take any extra fluid after taking his diuretic. He does this at times because of some mild cramping. He tolerates his carvedilol. He is on only a low dose of an ACE inhibitor. I have reviewed the records and I don't see any definite evidence that we had problems when trying to push his meds higher. His blood pressure is now such that he should be able to tolerate a higher dose of the ACE inhibitor and this will be done. He did not tolerate spironolactone. After pushing is a subtle see if we will try eplerinone

## 2012-01-13 NOTE — Assessment & Plan Note (Signed)
This has not been a significant problem for him lately. No change in therapy.

## 2012-01-13 NOTE — Patient Instructions (Signed)
   Follow up in 3 months.  Increase Lotensin to 10 mg daily. Take 2 of your 5 mg tablets. Call the office to notify how you are doing on this increased dose. If you are tolerating it well, we will call in a new prescription for the 10 mg tablets.

## 2012-01-13 NOTE — Progress Notes (Signed)
HPI  Patient returns for followup of  Nonischemic cardiomyopathy. He functions quite well. We have been pushing his medicines to the max that he tolerates. He did not tolerate spironolactone. Also he did not do well and we pushed his other medicines prior. He is fully functional at work. However there is one part of his duties at times involves lifting something that makes him more short of breath. I am contacting his business to be sure that he is asked not to do this. This should not jeopardize his job.  He does have some mild edema. He tells me that sometimes he has some mild cramping at night after taking his diuretic and he drink some fluid back. I encouraged him not to do this.  No Known Allergies  Current Outpatient Prescriptions  Medication Sig Dispense Refill  . aspirin 325 MG tablet Take 325 mg by mouth daily.        Marland Kitchen atorvastatin (LIPITOR) 20 MG tablet TAKE ONE TABLET BY MOUTH EVERY DAY  30 tablet  6  . benazepril (LOTENSIN) 5 MG tablet TAKE ONE TABLET BY MOUTH EVERY DAY  30 tablet  6  . carvedilol (COREG) 25 MG tablet Take 1 tablet (25 mg total) by mouth 2 (two) times daily.  60 tablet  6  . furosemide (LASIX) 40 MG tablet Take 1 tablet (40 mg total) by mouth daily.        History   Social History  . Marital Status: Single    Spouse Name: N/A    Number of Children: 5  . Years of Education: N/A   Occupational History  . [employer]     Full time   Social History Main Topics  . Smoking status: Never Smoker   . Smokeless tobacco: Never Used  . Alcohol Use: Yes     OCCASIONAL  (NOT HEAVY)  . Drug Use: Not on file  . Sexually Active: Not on file   Other Topics Concern  . Not on file   Social History Narrative   Lives in [place], [place] with fiance.No regular exercise.    Family History  Problem Relation Age of Onset  . Heart attack Father 60    Multiple MIs  . Heart attack Brother   . Heart failure Brother     CHF  . Hypertension Mother   . Heart attack Paternal  Grandfather     Past Medical History  Diagnosis Date  . Mitral regurgitation     Mild to moderate, echo, November, 2011  . Cardiomyopathy     Nonischemic / catheterization October, 2011, normal coronary arteries  . CHF (congestive heart failure)     Systolic  . Ejection fraction < 50%     EF 35%, 2009 / EF 30-35%, January, 2011 moderate left ventricular dilation / EF 20-25%, echo, November, 2011 , PA pressure 45 mmHg, normal RV function, mild to moderate mitral regurgitation  . ICD (implantable cardiac defibrillator) battery depletion 11/11/10    St. Jude, Dr.Allred  . Renal insufficiency     Creatinine 1.5 (GFR greater than 50)  . Headache     Patient seen to have headaches from spironolactone.  Drug was stopped, August, 2012  . Sleepiness     has some sleepiness during the day    Past Surgical History  Procedure Date  . Tee with cardioversion 11/17/08    No LV clot    ROS    Patient denies fever, chills, headache, sweats, rash, change in vision,  change in hearing, chest pain, cough, nausea vomiting, urinary symptoms. All other systems are reviewed and are negative.  PHYSICAL EXAM   Patient really looks quite good. He has except that over time his disease. He learns to work with him and he is a very pleasant gentleman. He is oriented to person time and place. Affect is normal. There is no jugular venous distention. Lungs are clear. Respiratory effort is nonlabored. Cardiac exam reveals S1 and S2. There no clicks or significant murmurs. The abdomen is soft. He does have trace peripheral edema. He's been up standing on his legs for hours today at work.  Filed Vitals:   01/13/12 1505  BP: 128/85  Pulse: 60  Height: 5\' 10"  (1.778 m)  Weight: 247 lb (112.038 kg)  SpO2: 97%    ASSESSMENT & PLAN

## 2012-01-17 ENCOUNTER — Telehealth: Payer: Self-pay | Admitting: *Deleted

## 2012-01-17 NOTE — Telephone Encounter (Signed)
Rhonda with HR at pt's job contacted the office. She states based on the letter that was given to the pt last week stating that the pt can continue to perform his usual job duties with the exception of infrequent heavy lifting, she would like to fax a copy of the pt's job description to the office. She would like Dr. Myrtis Ser to review this job description and sign off as to whether the pt is able to perform all of the duties listed. If not, she would like this stated and faxed back to her. She is aware Dr. Myrtis Ser will not be back in the office to review this until next week.

## 2012-01-25 ENCOUNTER — Ambulatory Visit (INDEPENDENT_AMBULATORY_CARE_PROVIDER_SITE_OTHER): Payer: BC Managed Care – PPO | Admitting: Internal Medicine

## 2012-01-25 ENCOUNTER — Encounter: Payer: Self-pay | Admitting: Internal Medicine

## 2012-01-25 DIAGNOSIS — I519 Heart disease, unspecified: Secondary | ICD-10-CM

## 2012-01-25 DIAGNOSIS — I428 Other cardiomyopathies: Secondary | ICD-10-CM

## 2012-01-25 DIAGNOSIS — I509 Heart failure, unspecified: Secondary | ICD-10-CM

## 2012-01-25 LAB — ICD DEVICE OBSERVATION
AL AMPLITUDE: 4 mv
AL THRESHOLD: 0.75 V
BAMS-0001: 180 {beats}/min
CHARGE TIME: 8.6 s
DEV-0020ICD: NEGATIVE
DEVICE MODEL ICD: 622431
HV IMPEDENCE: 65 Ohm
MODE SWITCH EPISODES: 0
PACEART VT: 0
RV LEAD THRESHOLD: 0.75 V
TOT-0006: 20120105000000
TOT-0010: 5
VENTRICULAR PACING ICD: 0 pct
VF: 0

## 2012-01-25 NOTE — Assessment & Plan Note (Signed)
Normal ICD function See Arita Miss Art report No changes today  I have spoke with Bonna Gains with [employer] who will try to arrange for a WiFi adapted for merlin checks.  In the interim, we will continue office visits every 3 months with Baxter Hire in [place] and I will see annually.

## 2012-01-25 NOTE — Patient Instructions (Signed)
   Follow up in 3 months with our device clinic Belenda Cruise) and 1 year with Dr. Johney Frame. Your physician recommends that you continue on your current medications as directed. Please refer to the Current Medication list given to you today.

## 2012-01-25 NOTE — Progress Notes (Signed)
PCP:  Kirstie Peri, MD, MD Primary Cardiologist:  Dr Myrtis Ser  The patient presents today for routine electrophysiology followup.  Since last being seen in our clinic, the patient reports doing very well. He continues to work.  Today, he denies symptoms of palpitations, chest pain, shortness of breath,lower extremity edema, dizziness, presyncope, syncope, or ICD shocks.  He is frequently tired with coreg.  He has an occasional sensation of "warmth" over his ICD but denies fevers, chills, drainage, pain, etc.    Past Medical History  Diagnosis Date  . Mitral regurgitation     Mild to moderate, echo, November, 2011  . Cardiomyopathy     Nonischemic / catheterization October, 2011, normal coronary arteries  . CHF (congestive heart failure)     Systolic  . Ejection fraction < 50%     EF 35%, 2009 / EF 30-35%, January, 2011 moderate left ventricular dilation / EF 20-25%, echo, November, 2011 , PA pressure 45 mmHg, normal RV function, mild to moderate mitral regurgitation  . ICD (implantable cardiac defibrillator) battery depletion 11/11/10    St. Jude, Dr.Jaaziel Peatross  . Renal insufficiency     Creatinine 1.5 (GFR greater than 50)  . Headache     Patient seen to have headaches from spironolactone.  Drug was stopped, August, 2012  . Sleepiness     has some sleepiness during the day   Past Surgical History  Procedure Date  . Tee with cardioversion 11/17/08    No LV clot  . Cardiac defibrillator placement 11/11/10    [facility] Fortify DR ICD implanted by Dr Johney Frame    Current Outpatient Prescriptions  Medication Sig Dispense Refill  . aspirin 325 MG tablet Take 325 mg by mouth daily.        Marland Kitchen atorvastatin (LIPITOR) 20 MG tablet TAKE ONE TABLET BY MOUTH EVERY DAY  30 tablet  6  . benazepril (LOTENSIN) 5 MG tablet Take 2 tablets (10 mg total) by mouth daily.  30 tablet  6  . carvedilol (COREG) 25 MG tablet Take 1 tablet (25 mg total) by mouth 2 (two) times daily.  60 tablet  6  . furosemide (LASIX) 40 MG tablet  Take 1 tablet (40 mg total) by mouth daily.        No Known Allergies  History   Social History  . Marital Status: Single    Spouse Name: N/A    Number of Children: 5  . Years of Education: N/A   Occupational History  . [employer]     Full time   Social History Main Topics  . Smoking status: Never Smoker   . Smokeless tobacco: Never Used  . Alcohol Use: Yes     OCCASIONAL  (NOT HEAVY)  . Drug Use: Not on file  . Sexually Active: Not on file   Other Topics Concern  . Not on file   Social History Narrative   Lives in [place], [place] with fiance.No regular exercise.    Family History  Problem Relation Age of Onset  . Heart attack Father 33    Multiple MIs  . Heart attack Brother   . Heart failure Brother     CHF  . Hypertension Mother   . Heart attack Paternal Grandfather     Physical Exam: Filed Vitals:   01/25/12 0904  BP: 113/76  Pulse: 67  Height: 5\' 10"  (1.778 m)  Weight: 245 lb (111.131 kg)    GEN- The patient is well appearing, alert and oriented x 3 today.  Head- normocephalic, atraumatic Eyes-  Sclera clear, conjunctiva pink Ears- hearing intact Oropharynx- clear Neck- supple, no JVP Lymph- no cervical lymphadenopathy Lungs- Clear to ausculation bilaterally, normal work of breathing Chest- ICD pocket is well healed Heart- Regular rate and rhythm, no murmurs, rubs or gallops, PMI not laterally displaced GI- soft, NT, ND, + BS Extremities- no clubbing, cyanosis, or edema   ICD interrogation- reviewed in detail today,  See PACEART report  Assessment and Plan:

## 2012-02-22 ENCOUNTER — Telehealth: Payer: Self-pay | Admitting: Cardiology

## 2012-02-22 NOTE — Telephone Encounter (Signed)
Patient thinks its the lipitor since he took all the medications separately and after taking lipitor, he started feeling the headache and sluggishness. Patient hasn't taken the lipitor since Friday and the headaches went away. Patient stated the sluggishness continues from time to time.

## 2012-02-22 NOTE — Telephone Encounter (Signed)
Left message for patient to call office.  

## 2012-02-22 NOTE — Telephone Encounter (Signed)
LIPITOR GIVING PATIENTS REALLY BAD HEADACHES, ALSO MAKES PATIENT FEEL LIGHT HEADED AND SLUGGISH

## 2012-02-23 NOTE — Telephone Encounter (Signed)
Okay to stay off Lipitor for now.

## 2012-02-23 NOTE — Telephone Encounter (Signed)
Informed patient via voicemail that he can stay off lipitor.

## 2012-02-29 ENCOUNTER — Telehealth: Payer: Self-pay | Admitting: *Deleted

## 2012-02-29 NOTE — Telephone Encounter (Signed)
Spoke with pt regarding HR issues at work. He states HR has a problem with note given to pt at 01/13/12 office visit regarding his ability to return to work w/o restrictions w/the exception of infrequent heavy lifting. Pt states in the past year since returning to work he has not had to do any heavy lifting. He states his direct supervisor has not had any problems with him working in this capacity. However, he has been in a meeting w/Rhonda Ashe in HR for over an hour today regarding this issue and his ability to lift. He plans to speak w/president of the company (possibly w/his Psychologist, educational) to discuss further and will notify our office if he needs anything further.

## 2012-02-29 NOTE — Telephone Encounter (Signed)
Pt left message on voicemail asking for a return call regarding a work note that Dr. Myrtis Ser wrote for him.   Attempted to reach pt. Unable to leave a message-voicemail has not been set up.

## 2012-03-13 ENCOUNTER — Telehealth: Payer: Self-pay | Admitting: *Deleted

## 2012-03-13 NOTE — Telephone Encounter (Signed)
Patient examined by ED on last Sunday (03/04/12) for sweats and fever,chills also saw family doctor Sherryll Burger) the Monday after ED visit. Patient was informed that it was viral, and was informed to take tylenol and advil for fever. Patient is still having the problem and is asking if cardiologist have any suggestions as to what is going on. Nurse advised patient's wife to call PCP back and inform him that problems still occuring and that cardiologist would be advised for any suggestions.

## 2012-03-15 NOTE — Telephone Encounter (Signed)
I do not have any additional suggestions. He should followup with Dr.Shah if he's not feeling better.

## 2012-03-15 NOTE — Telephone Encounter (Signed)
Patient's wife informed via voicemail.

## 2012-04-17 ENCOUNTER — Telehealth: Payer: Self-pay | Admitting: Cardiology

## 2012-04-17 NOTE — Telephone Encounter (Signed)
Patient woke up about 2am Sun with leg cramps, thighs and back of leg, running fever, throwing up clear fluid.  Went to ER [facility]  Received a shot of Atovan, they think, and IV fluids.  Also a script for Flexiril.  Patient wants to make sure ok to take Flexiril, because on instruction states if patient has heart failure, do not take flexiril.    Please call patient.

## 2012-04-17 NOTE — Telephone Encounter (Signed)
PA to be informed and respond.

## 2012-04-18 NOTE — Telephone Encounter (Signed)
Pt's with CHF or dysrhythmia are cautioned against using Flexeril. Therefore, I would not advise pt to use Flexeril.

## 2012-04-18 NOTE — Telephone Encounter (Signed)
Patient informed. Spoke with [name].

## 2012-05-18 ENCOUNTER — Encounter: Payer: Self-pay | Admitting: Internal Medicine

## 2012-05-18 ENCOUNTER — Ambulatory Visit (INDEPENDENT_AMBULATORY_CARE_PROVIDER_SITE_OTHER): Payer: BC Managed Care – PPO | Admitting: *Deleted

## 2012-05-18 DIAGNOSIS — I428 Other cardiomyopathies: Secondary | ICD-10-CM

## 2012-05-18 DIAGNOSIS — I509 Heart failure, unspecified: Secondary | ICD-10-CM

## 2012-05-18 LAB — ICD DEVICE OBSERVATION
AL THRESHOLD: 0.75 V
ATRIAL PACING ICD: 0.08 pct
BAMS-0001: 180 {beats}/min
DEV-0020ICD: NEGATIVE
DEVICE MODEL ICD: 622431
FVT: 0
MODE SWITCH EPISODES: 0
PACEART VT: 0
RV LEAD AMPLITUDE: 11.2 mv
RV LEAD THRESHOLD: 1 V
TZON-0003SLOWVT: 335 ms
TZON-0010SLOWVT: 40 ms
VENTRICULAR PACING ICD: 0 pct

## 2012-05-18 NOTE — Progress Notes (Signed)
icd check in clinic  

## 2012-05-21 IMAGING — CR DG CHEST 2V
2 series · 2 of 2 positions shown · non-contrast
Comparison: None.

CLINICAL DATA: Left ventricular dysfunction.

CHEST - 2 VIEW

[view not recorded (1 of 2)]
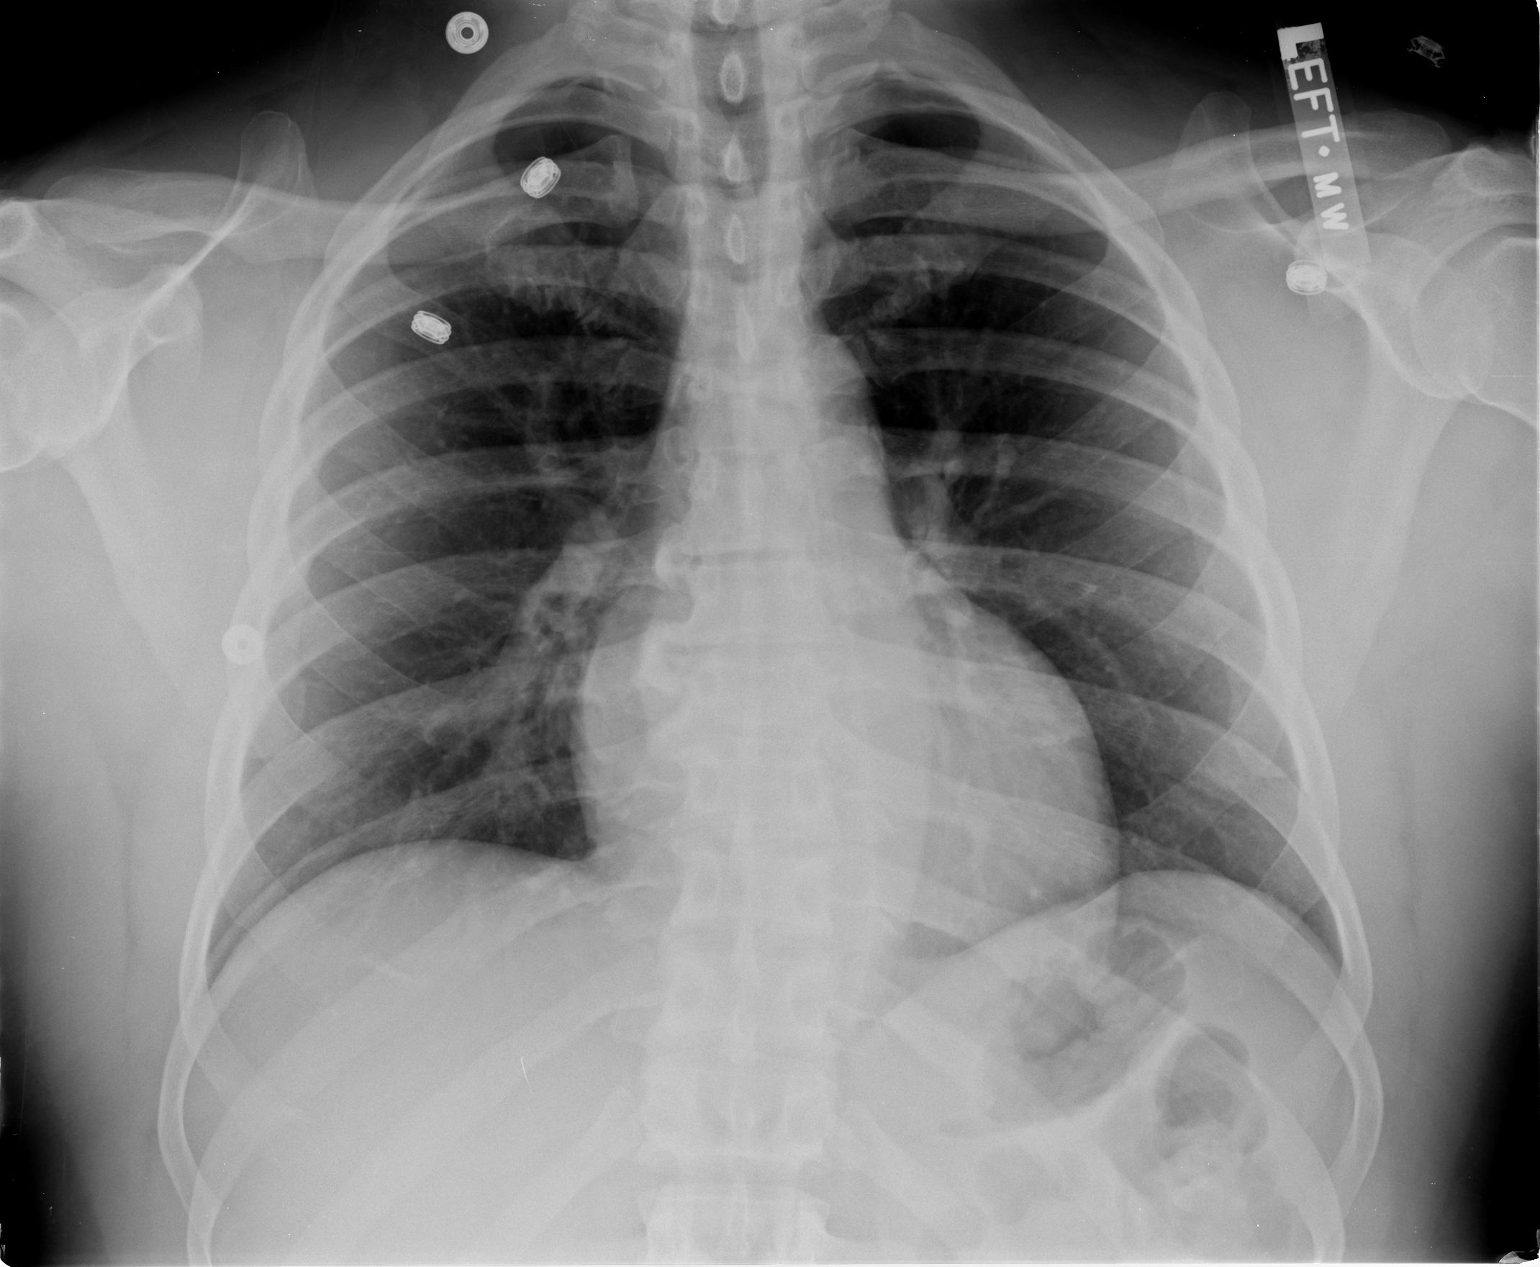

[view not recorded (2 of 2)]
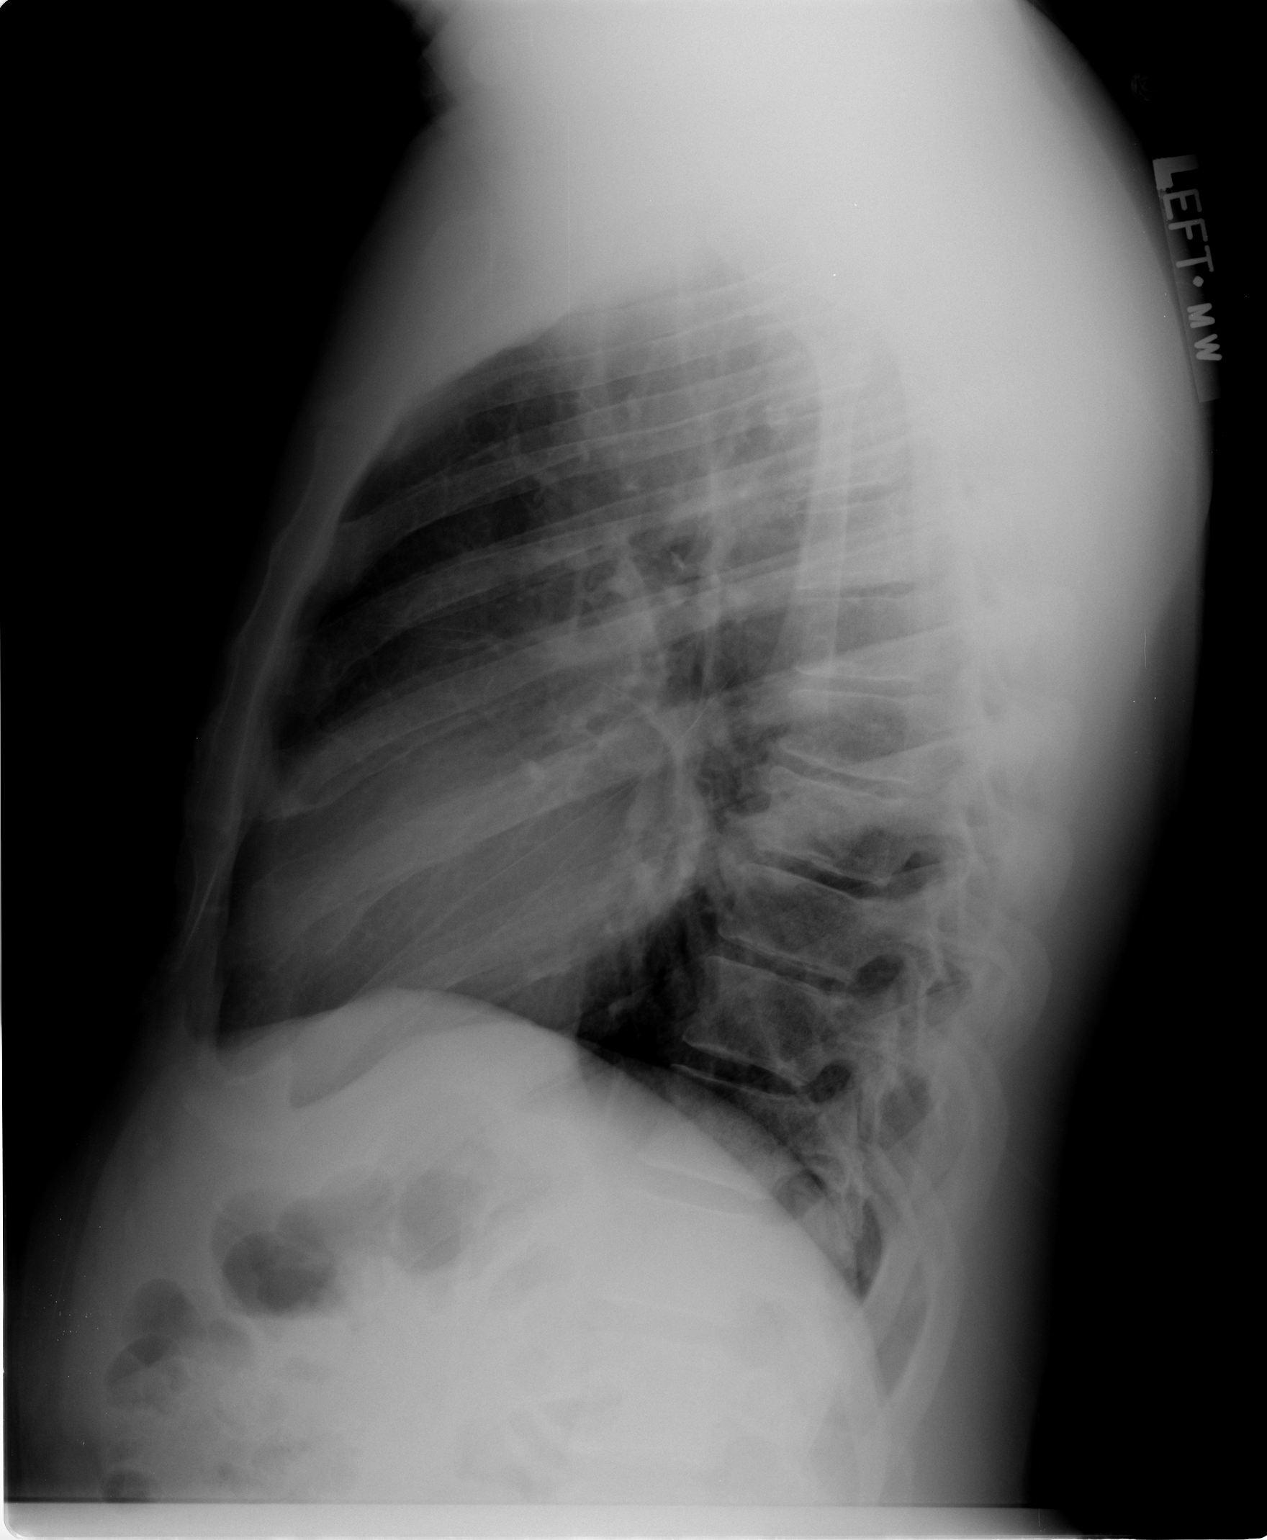

[2 of 2 positions shown; findings below may reference images not displayed]

FINDINGS: The cardiac silhouette is normal size and shape. The
lungs are well aerated and free of infiltrates. No pleural
abnormality is evident.There are osteophytes in the spine.
IMPRESSION: No cardiopulmonary disease or acute process is seen.  There are
osteophytes in the spine.

## 2012-05-22 IMAGING — CR DG CHEST 2V
2 series · 2 of 2 positions shown · non-contrast
Comparison: 11/11/2010

CLINICAL DATA: The left ventricular dysfunction.  Post pacer
placement and unable to raise the left arm.

CHEST - 2 VIEW

[w chest pa]
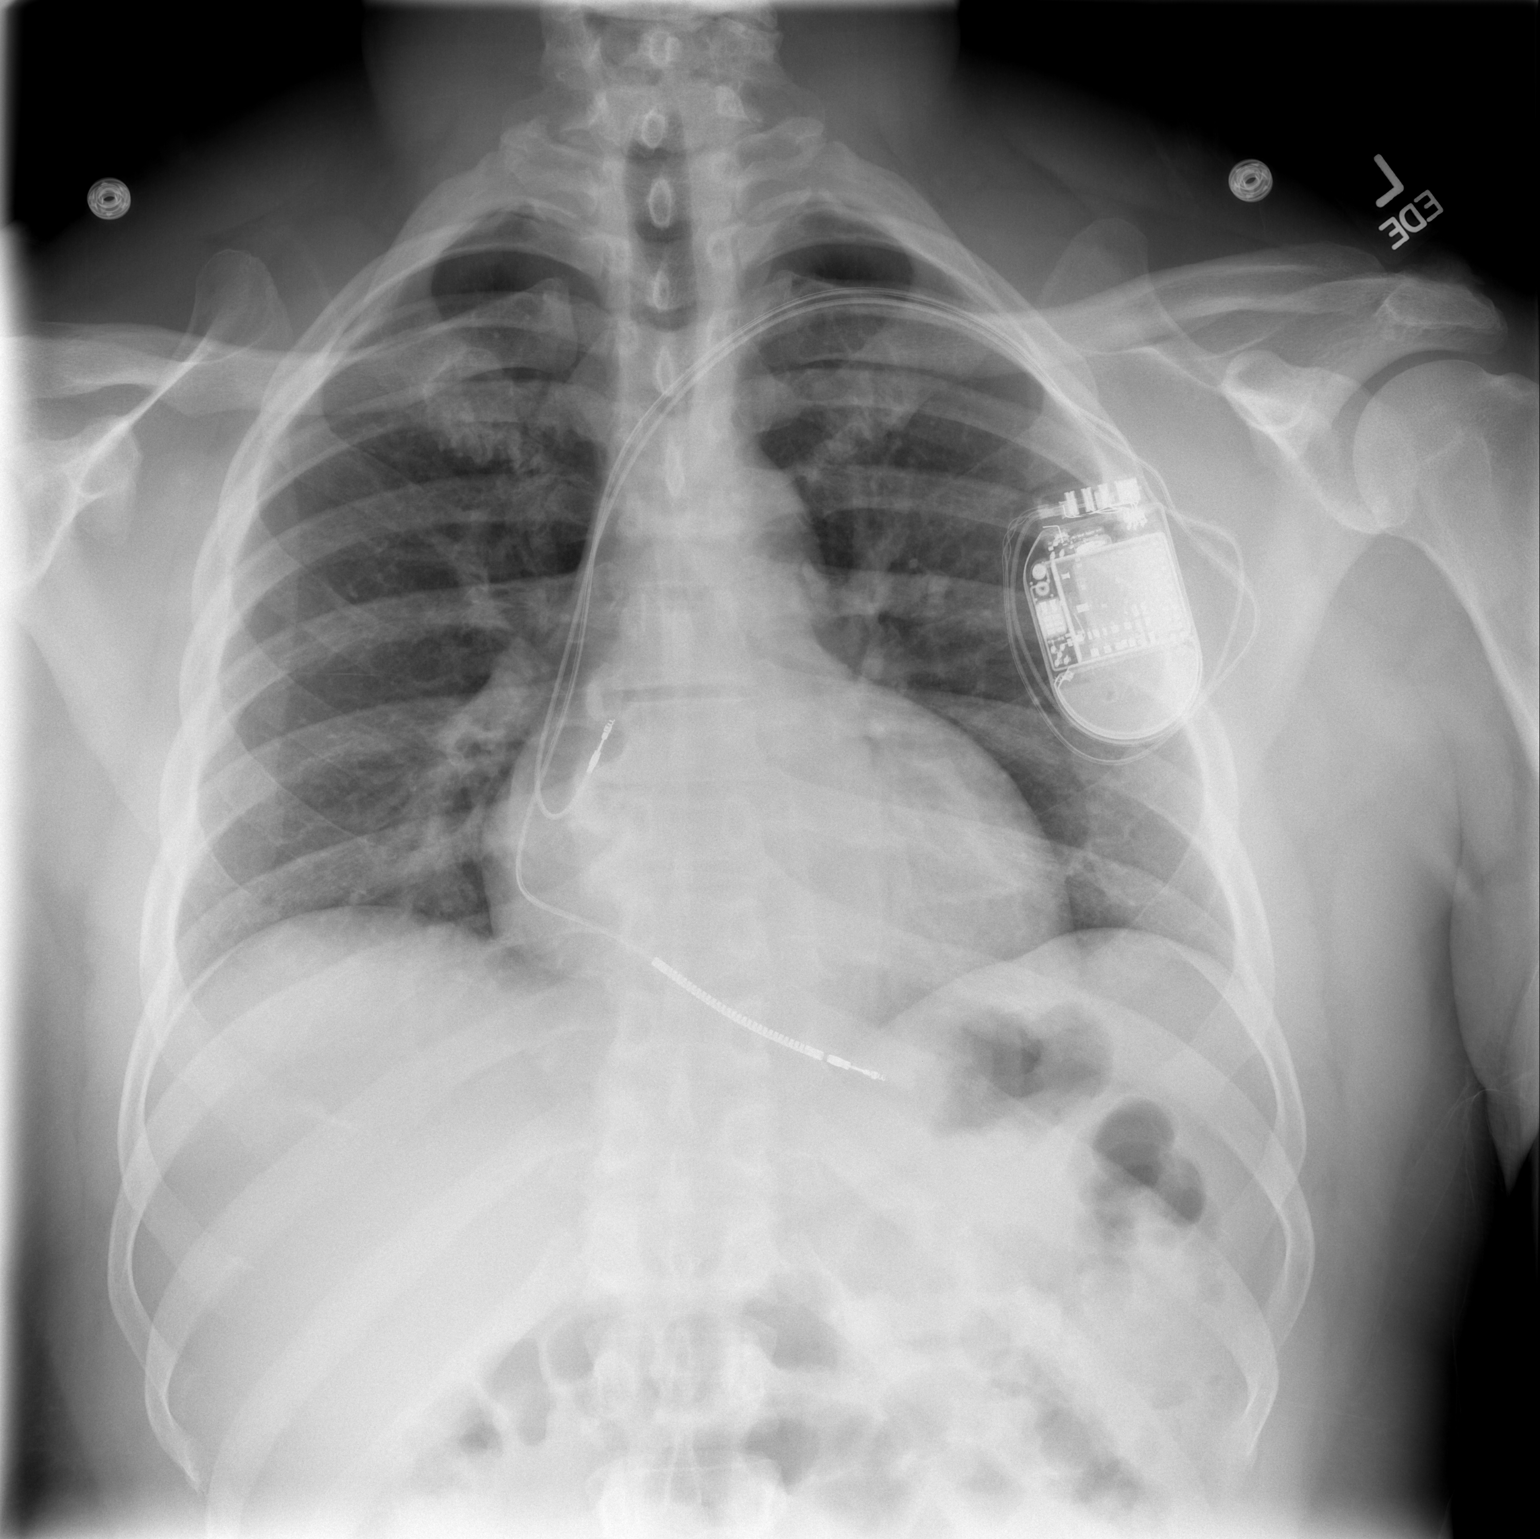

[w chest lat]
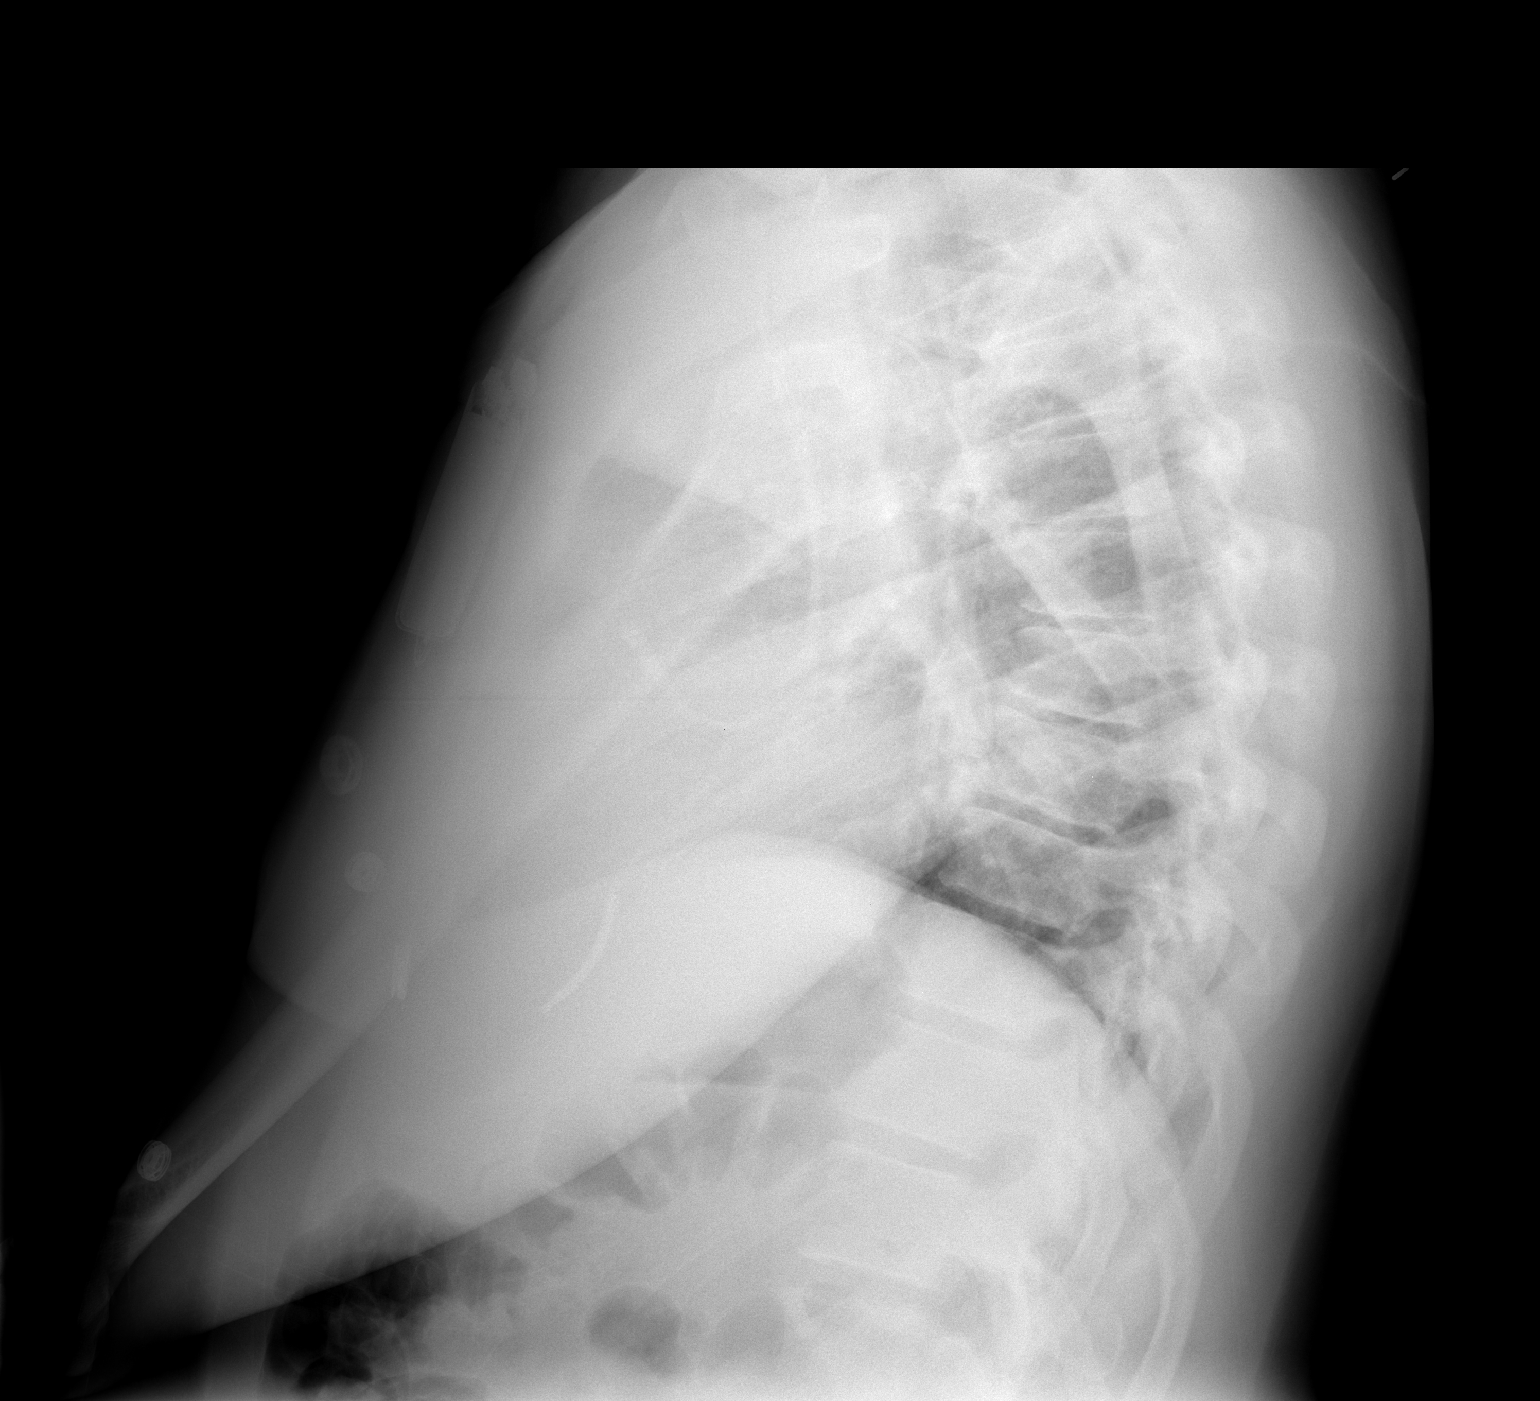

[2 of 2 positions shown; findings below may reference images not displayed]

FINDINGS: A dual lead pacer has been placed via a left subclavian
approach with lead tips overlying the right atrium and right
ventricle.

The cardiothymic silhouette is within normal limits.  The lung
fields are clear with no signs of focal infiltrate or congestive
failure.

No pleural fluid or peribronchial cuffing is seen.

Bony structures appear intact.
IMPRESSION: Stable cardiopulmonary appearance post pacer placement.

## 2012-06-02 DIAGNOSIS — M6281 Muscle weakness (generalized): Secondary | ICD-10-CM

## 2012-06-05 ENCOUNTER — Inpatient Hospital Stay (HOSPITAL_COMMUNITY)
Admission: EM | Admit: 2012-06-05 | Discharge: 2012-06-08 | DRG: 544 | Disposition: A | Discharge status: Home / Self Care | Payer: BC Managed Care – PPO | Source: Other Acute Inpatient Hospital | Attending: Cardiology | Admitting: Cardiology

## 2012-06-05 ENCOUNTER — Other Ambulatory Visit: Payer: Self-pay | Admitting: Physician Assistant

## 2012-06-05 ENCOUNTER — Encounter: Payer: Self-pay | Admitting: Physician Assistant

## 2012-06-05 ENCOUNTER — Encounter (HOSPITAL_COMMUNITY): Payer: Self-pay | Admitting: Family Medicine

## 2012-06-05 DIAGNOSIS — I472 Ventricular tachycardia, unspecified: Secondary | ICD-10-CM

## 2012-06-05 DIAGNOSIS — M6281 Muscle weakness (generalized): Secondary | ICD-10-CM

## 2012-06-05 DIAGNOSIS — N289 Disorder of kidney and ureter, unspecified: Secondary | ICD-10-CM | POA: Diagnosis present

## 2012-06-05 DIAGNOSIS — E876 Hypokalemia: Secondary | ICD-10-CM | POA: Diagnosis present

## 2012-06-05 DIAGNOSIS — I429 Cardiomyopathy, unspecified: Secondary | ICD-10-CM

## 2012-06-05 DIAGNOSIS — I369 Nonrheumatic tricuspid valve disorder, unspecified: Secondary | ICD-10-CM

## 2012-06-05 DIAGNOSIS — Z79899 Other long term (current) drug therapy: Secondary | ICD-10-CM

## 2012-06-05 DIAGNOSIS — F411 Generalized anxiety disorder: Secondary | ICD-10-CM | POA: Diagnosis present

## 2012-06-05 DIAGNOSIS — I4729 Other ventricular tachycardia: Principal | ICD-10-CM | POA: Diagnosis present

## 2012-06-05 DIAGNOSIS — N189 Chronic kidney disease, unspecified: Secondary | ICD-10-CM | POA: Diagnosis present

## 2012-06-05 DIAGNOSIS — I509 Heart failure, unspecified: Secondary | ICD-10-CM

## 2012-06-05 DIAGNOSIS — I428 Other cardiomyopathies: Secondary | ICD-10-CM | POA: Diagnosis present

## 2012-06-05 DIAGNOSIS — I38 Endocarditis, valve unspecified: Secondary | ICD-10-CM | POA: Diagnosis present

## 2012-06-05 DIAGNOSIS — R9430 Abnormal result of cardiovascular function study, unspecified: Secondary | ICD-10-CM

## 2012-06-05 DIAGNOSIS — I519 Heart disease, unspecified: Secondary | ICD-10-CM

## 2012-06-05 DIAGNOSIS — Z7982 Long term (current) use of aspirin: Secondary | ICD-10-CM

## 2012-06-05 DIAGNOSIS — I5022 Chronic systolic (congestive) heart failure: Secondary | ICD-10-CM | POA: Diagnosis present

## 2012-06-05 HISTORY — DX: Presence of automatic (implantable) cardiac defibrillator: Z95.810

## 2012-06-05 LAB — CBC
HCT: 38.9 % — ABNORMAL LOW (ref 39.0–52.0)
MCV: 83.1 fL (ref 78.0–100.0)
RDW: 14.9 % (ref 11.5–15.5)
WBC: 4.3 10*3/uL (ref 4.0–10.5)

## 2012-06-05 LAB — CREATININE, SERUM: GFR calc Af Amer: 75 mL/min — ABNORMAL LOW (ref >90)

## 2012-06-05 LAB — BASIC METABOLIC PANEL
CO2: 24 mEq/L (ref 19–32)
Calcium: 9 mg/dL (ref 8.4–10.5)
Chloride: 102 mEq/L (ref 96–112)
Creatinine, Ser: 1.32 mg/dL (ref 0.50–1.35)
GFR calc Af Amer: 73 mL/min — ABNORMAL LOW (ref >90)
Sodium: 138 mEq/L (ref 135–145)

## 2012-06-05 MED ORDER — ONDANSETRON HCL 4 MG/2ML IJ SOLN: 4.0000 mg | Freq: Four times a day (QID) | INTRAMUSCULAR | Status: DC | PRN

## 2012-06-05 MED ORDER — SODIUM CHLORIDE 0.9 % IV SOLN: 250.0000 mL | INTRAVENOUS | Status: DC | PRN

## 2012-06-05 MED ORDER — BENAZEPRIL HCL 5 MG PO TABS
5.0000 mg | ORAL_TABLET | Freq: Every day | ORAL | Status: DC
  Administered 2012-06-05 – 2012-06-08 (×4): 5 mg via ORAL
  Filled 2012-06-05 (×4): qty 1

## 2012-06-05 MED ORDER — SOTALOL HCL 120 MG PO TABS
120.0000 mg | ORAL_TABLET | Freq: Two times a day (BID) | ORAL | Status: DC
  Administered 2012-06-05 – 2012-06-08 (×6): 120 mg via ORAL
  Filled 2012-06-05 (×7): qty 1

## 2012-06-05 MED ORDER — CARVEDILOL 12.5 MG PO TABS
12.5000 mg | ORAL_TABLET | Freq: Two times a day (BID) | ORAL | Status: DC
  Administered 2012-06-06 – 2012-06-08 (×5): 12.5 mg via ORAL
  Filled 2012-06-05 (×7): qty 1

## 2012-06-05 MED ORDER — LIDOCAINE IN D5W 4-5 MG/ML-% IV SOLN
1.0000 mg/min | INTRAVENOUS | Status: DC
  Administered 2012-06-05: 2 mg/min via INTRAVENOUS
  Filled 2012-06-05: qty 500

## 2012-06-05 MED ORDER — SODIUM CHLORIDE 0.9 % IJ SOLN
3.0000 mL | Freq: Two times a day (BID) | INTRAMUSCULAR | Status: DC
  Administered 2012-06-05 – 2012-06-08 (×6): 3 mL via INTRAVENOUS

## 2012-06-05 MED ORDER — LIDOCAINE IN D5W 4-5 MG/ML-% IV SOLN
1.0000 mg/min | INTRAVENOUS | Status: DC
  Filled 2012-06-05: qty 500
  Filled 2012-06-05: qty 250

## 2012-06-05 MED ORDER — FUROSEMIDE 10 MG/ML IJ SOLN
40.0000 mg | Freq: Two times a day (BID) | INTRAMUSCULAR | Status: DC
  Administered 2012-06-05: 40 mg via INTRAVENOUS
  Filled 2012-06-05: qty 4

## 2012-06-05 MED ORDER — ASPIRIN EC 325 MG PO TBEC
325.0000 mg | DELAYED_RELEASE_TABLET | Freq: Every day | ORAL | Status: DC
  Administered 2012-06-06 – 2012-06-08 (×3): 325 mg via ORAL
  Filled 2012-06-05 (×3): qty 1

## 2012-06-05 MED ORDER — LIDOCAINE IN D5W 4-5 MG/ML-% IV SOLN
1.0000 mg/min | INTRAVENOUS | Status: DC
  Filled 2012-06-05: qty 250

## 2012-06-05 MED ORDER — SODIUM CHLORIDE 0.9 % IJ SOLN
3.0000 mL | INTRAMUSCULAR | Status: DC | PRN
  Administered 2012-06-06: 3 mL via INTRAVENOUS

## 2012-06-05 MED ORDER — LIDOCAINE IN D5W 4-5 MG/ML-% IV SOLN
1.0000 mg/min | INTRAVENOUS | Status: DC
  Filled 2012-06-05 (×2): qty 250

## 2012-06-05 MED ORDER — CARVEDILOL 6.25 MG PO TABS
6.2500 mg | ORAL_TABLET | Freq: Two times a day (BID) | ORAL | Status: DC
  Administered 2012-06-05: 6.25 mg via ORAL
  Filled 2012-06-05 (×2): qty 1

## 2012-06-05 MED ORDER — ACETAMINOPHEN 325 MG PO TABS: 650.0000 mg | ORAL_TABLET | ORAL | Status: DC | PRN

## 2012-06-05 MED ORDER — LIDOCAINE IN D5W 4-5 MG/ML-% IV SOLN
1.0000 mg/min | INTRAVENOUS | Status: DC
  Filled 2012-06-05: qty 500

## 2012-06-05 MED ORDER — NITROGLYCERIN 0.4 MG SL SUBL: 0.4000 mg | SUBLINGUAL_TABLET | SUBLINGUAL | Status: DC | PRN

## 2012-06-05 MED ORDER — HEPARIN SODIUM (PORCINE) 5000 UNIT/ML IJ SOLN
5000.0000 [IU] | Freq: Three times a day (TID) | INTRAMUSCULAR | Status: DC
  Administered 2012-06-05 – 2012-06-07 (×8): 5000 [IU] via SUBCUTANEOUS
  Filled 2012-06-05 (×12): qty 1

## 2012-06-05 NOTE — H&P (Signed)
NAME:  Adam [name], Adam [name] MARK ROOM:   UNIT NUMBER:  [id number] LOCATION: ER ADM/VISIT DATE:  06/05/2012   ADM Vaughan BrownerKathaleen Grinder:  [id number] DOB: Apr 07, 1966   PRIMARY CARDIOLOGIST:  Willa Rough, M.D.  PRIMARY ELECTROPHYSIOLOGIST:  Hillis Range, M.D.  REFERRING PHYSICIAN:  Van Clines, M.D., [facility] ED  REASON FOR CONSULTATION:  Multiple ICD shocks.  HISTORY OF PRESENT ILLNESS:  Mr. Uhlig is a 46 year old male, with nonischemic cardiomyopathy, and status post St. Jude ICD implantation, followed by Dr. Johney Frame here in [place].  He presented to the emergency room with complaint of multiple ICD shocks, which began at approximately 5:00 a.m. this morning.  His device was interrogated, and results were reviewed with Dr. Johney Frame, who recommended decreasing the detection threshold to 179 BPM (from 220 BPM).  According to the [facility]. Jude rep, the patient had clear evidence of "VT storm", with several more episodes, other than those which crossed the threshold, thereby inducing defibrillation.  Clinically, the patient denied any chest pain during these episodes earlier this morning.  This is the first time this device has fired, since implantation.  He recalls feeling elevated heart rate at approximately 5:00 a.m., with no other associated symptoms.  He then had a succession of 5 discrete shocks, with the last occurring just as he arrived here in the ED.  The patient presented with a blood pressure 130/103 and was afebrile.  He was found to have a potassium level of 2.9, and received 40 mEq of oral potassium.  He also was treated with 1 gram of IV magnesium.  Magnesium level was 1.9.  Admission EKG indicated a run of ventricular tachycardia (approximately 15 beats), with followup EKG indicating normal sinus rhythm with isolated PVCs, and no acute changes.  ALLERGIES:  ALDACTONE INTOLERANT, STATIN INTOLERANT.  HOME MEDICATIONS: 1. Aspirin 325 mg daily. 2. Lotensin 5 mg daily. 3. Carvedilol 12.5 mg  daily. 4. Lasix 40 mg b.i.d.  PAST MEDICAL HISTORY: 1. Nonischemic cardiomyopathy. a. EF 20% - 25%, 2-D echo, 09/2010. b. Normal coronary arteries, cardiac catheterization, 08/2010. c. Status post St. Jude ICD implantation, 11/2010. 2. Chronic systolic heart failure. 3. Valvular heart disease. a. Mild/moderate MR, 2-D echo, 09/2010. 4. Renal insufficiency.  SOCIAL HISTORY:  Married.  Negative tobacco or alcohol use.  FAMILY HISTORY:  Noncontributory for premature coronary artery disease.  REVIEW OF SYSTEMS:  Denies any recent development of exertional angina.  Has had some occasional mild exertional dyspnea, as well as some recent mild orthopnea, PND, but no lower extremity edema.  Continues to have occasional palpitations, but no increase in frequency.  Denies any recent weight change.  Denies any added salt to his diet.  Denies any near syncope/syncope.  The remaining systems reviewed are negative.  PHYSICAL EXAMINATION:  Vital signs:  Blood pressure currently 116/90, pulse ranging 90 - 100, regular, respirations 20, temperature 96.9, sats 99% on 2 liters, weight 230 pounds.  General:  A 46 year old male, well-developed, well-nourished lying supine in no distress.  HEENT:  Normocephalic, atraumatic.  PERRLA.  EOMI.  Neck:  Palpable bilateral carotid pulses without bruits; no JVD at 30 degrees.  Lungs:  Clear to auscultation in all fields.  Heart:  Regular rate and rhythm.  No significant murmurs.  No rubs or gallops.  Abdomen:  Soft, intact bowel sounds.  Extremities:  No significant peripheral edema.  Skin:  Warm and dry.  Musculoskeletal:  No obvious deformity.  Neurologic:  Alert and oriented.  IMAGING:  Admission chest x-ray:  No  acute changes.  LABORATORY DATA:  CPK 330/5.4, troponin I 0.08, INR 1.0.  BNP 140.  LFTs normal.  Urine drug screen:  Pending.  Urinalysis negative.  Sodium 139, potassium 2.9, BUN 16, creatinine 1.6 (GFR 46), glucose 137.  CBC:   Normal.  IMPRESSION: 1. Ventricular tachycardia storm. a. Status post multiple ICD discharges. b. Reprogrammed device to 179 BPM threshold. 2. Nonischemic cardiomyopathy. a. EF 20% - 25%. b. Normal coronary arteries, 2011. 3. Chronic systolic heart failure. 4. Hypokalemia. 5. Chronic renal insufficiency.  PLAN:  Recommendation is to transfer the patient directly to [facility] for continued close monitoring and management of ventricular tachycardia.  He has not had any recurrent defibrillator shocks, since initial presentation.  As noted, his device has been interrogated and reprogrammed, following consultation with Dr. Johney Frame.  Our recommendations are to adjust his current home medication regimen as follows:  Change carvedilol from 12.5 mg daily to 6.25 mg b.i.d., given probable symptomatic hypotension following his usual morning medications.  He will be given 1 carvedilol dose prior to transfer.  We will also place him on supplemental potassium at 20 mEq b.i.d., with repeat labs in a.m.  Recommend maintaining potassium at level of 4.0, or greater.  We will also check a TSH level.  Plan to have our EP team evaluate him for further recommendations regarding medical management.  We will also order a 2-D echocardiogram for reassessment of left ventricular function, as well as severity of mitral regurgitation.  The patient is in agreement with this plan for transfer to [facility], following review and discussion with Dr. Nona Dell.  Rozell Searing, PA, dictating for Nona Dell, M.D.   __________________________    Rozell Searing, P.A. Alinda Money D: 06/05/2012 4782 T: 06/05/2012 1029 P: SER2  Attending note:  Patient seen and examined. Reviewed records and database as recorded by Mr. Serpe. He has a history of nonischemic cardiomyopathy, LVEF 20-25% as of 2011, status post St. Jude ICD placement by Dr. Johney Frame. He presented to the [facility] ER today after 5 sudden onset device  shocks. He complained of palpitations prior to onset, no recent symptoms of chest pain or breathlessness. He reports compliance with his medications. He was noted to be hypokalemic at presentation, not on potassium supplement at baseline. He reports stable weight, no recent syncope.   Dr. Johney Frame reviewed device parameter modifications during device interrogation in the ER at [facility]. We now plan to transfer him to [facility] for further EP evaluation. It seems unlikely that ischemia would be a source of the patient's recurrent VT in light of documentation of normal coronary arteries in 2011. Certainly his hypokalemia needs to be corrected, and medications can be further modified, at this point he was taking his Coreg only once a day it seems. Question as to whether he may need initiation of amiodarone for VT suppression with subsequent threshold testing can be discussed as well.  Jonelle Sidle, M.D., F.A.C.C.

## 2012-06-05 NOTE — Progress Notes (Signed)
Full EP consult to follow in am Pt is well known to me.  Briefly, Adam [name] presented with VT storm this am (CL 262) for which he received 5 ICD shocks. His ICD interrogation is reviewed in detail by me.  This reveals normal device function and appropriate shocks for VT. His rhythm is better now on IV lidocaine. His K this am was per report 2.9.  At this point, we will replete K and Mg Add sotalol 120mg  BID and follow QT Stop lidocainde We will continue to try to titrate coreg once stable on sotalol.  [name],MD

## 2012-06-05 NOTE — Progress Notes (Signed)
  Echocardiogram 2D Echocardiogram has been performed.  Adam [name] 06/05/2012, 4:08 PM

## 2012-06-05 NOTE — Care Management Note (Signed)
    Page 1 of 1   06/05/2012     3:57:38 PM   CARE MANAGEMENT NOTE 06/05/2012  Patient:  Adam [name], Adam [name]   Account Number:  [id number]  Date Initiated:  06/05/2012  Documentation initiated by:  Adam [name]  Subjective/Objective Assessment:   adm w defib firings     Action/Plan:   lives w fam, pcp dr Beatrix Fetters shah   Anticipated DC Date:     Anticipated DC Plan:        DC Planning Services  CM consult      Choice offered to / List presented to:             Status of service:   Medicare Important Message given?   (If response is "NO", the following Medicare IM given date fields will be blank) Date Medicare IM given:   Date Additional Medicare IM given:    Discharge Disposition:    Per UR Regulation:  Reviewed for med. necessity/level of care/duration of stay  If discussed at Long Length of Stay Meetings, dates discussed:    Comments:  7/30 13:56p debbie Layli Capshaw rn,bsn [phone]

## 2012-06-06 DIAGNOSIS — I519 Heart disease, unspecified: Secondary | ICD-10-CM

## 2012-06-06 DIAGNOSIS — I428 Other cardiomyopathies: Secondary | ICD-10-CM

## 2012-06-06 DIAGNOSIS — N289 Disorder of kidney and ureter, unspecified: Secondary | ICD-10-CM

## 2012-06-06 DIAGNOSIS — I472 Ventricular tachycardia, unspecified: Secondary | ICD-10-CM

## 2012-06-06 LAB — BASIC METABOLIC PANEL
BUN: 15 mg/dL (ref 6–23)
Chloride: 103 mEq/L (ref 96–112)
GFR calc Af Amer: 73 mL/min — ABNORMAL LOW (ref >90)
Potassium: 4.3 mEq/L (ref 3.5–5.1)

## 2012-06-06 LAB — TSH: TSH: 5.27 u[IU]/mL — ABNORMAL HIGH (ref 0.350–4.500)

## 2012-06-06 NOTE — Progress Notes (Signed)
   SUBJECTIVE: The patient is doing well today.  At this time, he denies chest pain, shortness of breath, or any new concerns.  No further VT.     Marland Kitchen aspirin EC  325 mg Oral Daily  . benazepril  5 mg Oral Daily  . carvedilol  12.5 mg Oral BID WC  . heparin  5,000 Units Subcutaneous Q8H  . sodium chloride  3 mL Intravenous Q12H  . sotalol  120 mg Oral Q12H  . DISCONTD: carvedilol  6.25 mg Oral BID WC  . DISCONTD: furosemide  40 mg Intravenous BID      . DISCONTD: lidocaine    . DISCONTD: lidocaine 2 mg/min (06/05/12 1637)  . DISCONTD: lidocaine 1 mg/min (06/05/12 1951)  . DISCONTD: lidocaine    . DISCONTD: lidocaine Stopped (06/05/12 2206)    OBJECTIVE: Physical Exam: Filed Vitals:   06/06/12 0350 06/06/12 0401 06/06/12 0630 06/06/12 0724  BP: 93/61   93/59  Pulse: 54   69  Temp:  97.8 F (36.6 C)  97.9 F (36.6 C)  TempSrc:  Oral  Oral  Resp: 19     Height:      Weight:   229 lb 0.9 oz (103.9 kg)   SpO2: 98%   99%    Intake/Output Summary (Last 24 hours) at 06/06/12 0749 Last data filed at 06/06/12 0600  Gross per 24 hour  Intake 607.75 ml  Output   2200 ml  Net -1592.25 ml    Telemetry reveals sinus rhythm with short NSVT  GEN- The patient is well appearing, alert and oriented x 3 today.   Head- normocephalic, atraumatic Eyes-  Sclera clear, conjunctiva pink Ears- hearing intact Oropharynx- clear Neck- supple, no JVP Lymph- no cervical lymphadenopathy Lungs- Clear to ausculation bilaterally, normal work of breathing Heart- Regular rate and rhythm, no murmurs, rubs or gallops, PMI not laterally displaced GI- soft, NT, ND, + BS Extremities- no clubbing, cyanosis, or edema   LABS: Basic Metabolic Panel:  Basename 06/06/12 0522 06/05/12 2123  NA 140 138  K 4.3 4.0  CL 103 102  CO2 25 24  GLUCOSE 112* 133*  BUN 15 14  CREATININE 1.33 1.32  CALCIUM 9.0 9.0  MG -- --  PHOS -- --   Liver Function Tests: No results found for this basename:  AST:2,ALT:2,ALKPHOS:2,BILITOT:2,PROT:2,ALBUMIN:2 in the last 72 hours No results found for this basename: LIPASE:2,AMYLASE:2 in the last 72 hours CBC:  Basename 06/05/12 1614  WBC 4.3  NEUTROABS --  HGB 13.1  HCT 38.9*  MCV 83.1  PLT 230   ASSESSMENT AND PLAN:   1. Ventricular tachycardia Improved Keep Mg and K replete Sotalol 120mg  BID Titrated Coreg as BP allows  2. Nonischemic cardiomyopathy/ chronic systolic dysfunction a. EF 20% - 25%.  b. Normal coronary arteries, 2011.   Appears dry Lasix on hold  3. Hypokalemia. Replete as needed  4. Chronic renal insufficiency.  Improved with holding lasix overnight  Transfer to telemetry   Hillis Range, MD 06/06/2012 7:49 AM

## 2012-06-07 LAB — BASIC METABOLIC PANEL
Calcium: 9.1 mg/dL (ref 8.4–10.5)
Creatinine, Ser: 1.44 mg/dL — ABNORMAL HIGH (ref 0.50–1.35)
GFR calc Af Amer: 66 mL/min — ABNORMAL LOW (ref >90)
Sodium: 136 mEq/L (ref 135–145)

## 2012-06-07 LAB — MAGNESIUM: Magnesium: 2 mg/dL (ref 1.5–2.5)

## 2012-06-07 MED ORDER — OFF THE BEAT BOOK
Freq: Once | Status: DC
  Filled 2012-06-07: qty 1

## 2012-06-07 MED ORDER — POTASSIUM CHLORIDE CRYS ER 20 MEQ PO TBCR
40.0000 meq | EXTENDED_RELEASE_TABLET | Freq: Once | ORAL | Status: AC
  Administered 2012-06-07: 40 meq via ORAL
  Filled 2012-06-07: qty 2

## 2012-06-07 NOTE — Plan of Care (Signed)
Problem: Discharge Progression Outcomes Goal: Arrhythmia therapy established Outcome: Completed/Met Date Met:  06/07/12 Pt on Betapace

## 2012-06-07 NOTE — Progress Notes (Signed)
   ELECTROPHYSIOLOGY ROUNDING NOTE    Patient Name: Adam [name] Date of Encounter: 06-07-2012    SUBJECTIVE:Patient feels well.  No chest pain or shortness of breath.  Being loaded with Sotalol for VT.   TELEMETRY: Reviewed telemetry pt in sinus rhythm with occasional PVC's, no further VT Filed Vitals:   06/06/12 1103 06/06/12 1723 06/06/12 2100 06/07/12 0500  BP: 104/74 101/69 92/62 101/70  Pulse: 67 62 66 65  Temp: 98.2 F (36.8 C) 97.7 F (36.5 C) 98.4 F (36.9 C) 98.1 F (36.7 C)  TempSrc: Oral Oral Oral Oral  Resp: 16 20 18 18   Height:      Weight:    229 lb (103.874 kg)  SpO2: 96% 98% 97% 96%    Intake/Output Summary (Last 24 hours) at 06/07/12 0746 Last data filed at 06/06/12 1725  Gross per 24 hour  Intake    720 ml  Output      0 ml  Net    720 ml   Physical Exam: Filed Vitals:   06/06/12 1103 06/06/12 1723 06/06/12 2100 06/07/12 0500  BP: 104/74 101/69 92/62 101/70  Pulse: 67 62 66 65  Temp: 98.2 F (36.8 C) 97.7 F (36.5 C) 98.4 F (36.9 C) 98.1 F (36.7 C)  TempSrc: Oral Oral Oral Oral  Resp: 16 20 18 18   Height:      Weight:    229 lb (103.874 kg)  SpO2: 96% 98% 97% 96%    GEN- The patient is well appearing, alert and oriented x 3 today.   Head- normocephalic, atraumatic Eyes-  Sclera clear, conjunctiva pink Ears- hearing intact Oropharynx- clear Neck- supple, no JVP Lymph- no cervical lymphadenopathy Lungs- Clear to ausculation bilaterally, normal work of breathing Heart- Regular rate and rhythm, no murmurs, rubs or gallops, PMI not laterally displaced GI- soft, NT, ND, + BS Extremities- no clubbing, cyanosis, or edema    LABS: Basic Metabolic Panel:  Basename 06/06/12 0522 06/05/12 2123  NA 140 138  K 4.3 4.0  CL 103 102  CO2 25 24  GLUCOSE 112* 133*  BUN 15 14  CREATININE 1.33 1.32  CALCIUM 9.0 9.0  MG -- --  PHOS -- --   CBC:  Basename 06/05/12 1614  WBC 4.3  NEUTROABS --  HGB 13.1  HCT 38.9*  MCV 83.1  PLT 230    Thyroid Function Tests:  Basename 06/06/12 0522  TSH 5.270*  T4TOTAL --  T3FREE --  THYROIDAB --    EKG: sinus rhythm, QT 446-- stable  BMET and T4 pending today  Assessment and Plan: 1.  VT- controlled with sotalol/ coreg Qt is stable Keep K and Mg replete TSH is elevated, check T4 Anticipate discharge in am. Will need PA follow-up with EKG, BMET, Mg in 1 week Follow-up with Dr Myrtis Ser in 4 weeks Follow-up with me in 3 months Pt instructed to not drive for 6 months following his ICD shock.  He says that he will comply. 2. Nonischemic cardiomyopathy/ chronic systolic dysfunction  a. EF 20% - 25%.  b. Normal coronary arteries, 2011.  Appears dry  Resume lasix as outpatient 3. Hypokalemia. Replete as needed  4. Chronic renal insufficiency.  Improved with holding lasix overnight  Jarold Song

## 2012-06-08 LAB — BASIC METABOLIC PANEL
Calcium: 9 mg/dL (ref 8.4–10.5)
Creatinine, Ser: 1.39 mg/dL — ABNORMAL HIGH (ref 0.50–1.35)
GFR calc Af Amer: 69 mL/min — ABNORMAL LOW (ref >90)
GFR calc non Af Amer: 59 mL/min — ABNORMAL LOW (ref >90)
Sodium: 138 mEq/L (ref 135–145)

## 2012-06-08 LAB — T4, FREE: Free T4: 1.41 ng/dL (ref 0.80–1.80)

## 2012-06-08 MED ORDER — CARVEDILOL 25 MG PO TABS: 12.5000 mg | ORAL_TABLET | Freq: Two times a day (BID) | ORAL | Status: DC

## 2012-06-08 MED ORDER — SOTALOL HCL 120 MG PO TABS: 120.0000 mg | ORAL_TABLET | Freq: Two times a day (BID) | ORAL | Status: DC

## 2012-06-08 MED ORDER — ALPRAZOLAM 0.5 MG PO TABS: 0.5000 mg | ORAL_TABLET | Freq: Every day | ORAL | Status: AC | PRN

## 2012-06-08 MED ORDER — POTASSIUM CHLORIDE ER 10 MEQ PO TBCR
10.0000 meq | EXTENDED_RELEASE_TABLET | Freq: Every day | ORAL | Status: DC
Start: 2012-06-08 — End: 2012-08-27

## 2012-06-08 NOTE — Discharge Summary (Addendum)
ELECTROPHYSIOLOGY DISCHARGE SUMMARY    Patient ID: Adam [name],  MRN: [id number], DOB/AGE: [birth year] 46 y.o.  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 Hypokalemia  Secondary Discharge Diagnoses:  1. Nonischemic CM, EF 20-25% 2. Chronic systolic CHF 3. CKD  Procedures This Admission:  1. Transthoracic echocardiogram 06/05/2012  - Left ventricle: The cavity size was mildly dilated. Wall thickness was increased in a pattern of mild LVH. The estimated ejection fraction was 20%. Posterior severe hypokinesis, anterolateral severe hypokinesis, anterior severe hypokinesis, mid to apical septal akinesis, akinesis of true apex, apical inferior akinesis. Doppler parameters are consistent with restrictive physiology, indicative of decreased left ventricular diastolic compliance and/or increased left atrial pressure. - Aortic valve: Trileaflet. Doppler: There was no stenosis. No regurgitation. - Aorta: Aortic root: The aortic root was normal in size. Ascending aorta: The ascending aorta was normal in size. - Mitral valve: Doppler: There was no evidence for stenosis. Trivial regurgitation. Peak gradient: 3mm Hg (D). - Left atrium: The atrium was moderately dilated. - Right ventricle: Poorly visualized. The cavity size was normal. Pacer wire or catheter noted in right ventricle. Systolic function was mildly reduced. - Pulmonic valve: Structurally normal valve. Cusp separation was normal. Doppler: Transvalvular velocity was within the normal range. Trivial regurgitation. - Tricuspid valve: Doppler: Mild regurgitation. - Right atrium: The atrium was normal in size. - Pericardium: There was no pericardial effusion.  History and Hospital Course:  Mr. Ginsberg is a 46 year old gentleman with a nonischemic CM s/p  ICD implant, chronic systolic CHF and CKD who was admitted 06/05/2012 with VT storm s/p multiple ICD shocks. He was also found to be hypokalemic which was repleted. He started AAD therapy with sotalol and is tolerating this medication. He remains hemodynamically stable. His telemetry shows brief NSVT. This was reviewed by Dr. Ladona Ridgel. He has requested treatment for his anxiety regarding ICD shocks. We have provided a short term prescription for Xanax and instructed him to follow-up with his PCP for management of anxiety. He will follow-up for repeat ECG, BMET and Mg level in one week. He will see Dr. Myrtis Ser in 4 weeks and Dr. Johney Frame in 3 months unless needed sooner. He has been seen, examined and deemed stable for discharge today by Dr. Lewayne Bunting.  Physical Exam: Vitals: Blood pressure 90/61, pulse 60, temperature 98.1 F (36.7 C), temperature source Oral, resp. rate 18, height 5\' 11"  (1.803 m), weight 231 lb (104.781 kg), SpO2 98.00%.  General: Well developed, well appearing 46 year old male in no acute distress. Heart: RRR. S1, S2 without murmurs, rub, S3 or S4. Lungs: CTA bilaterally. No wheezes, rales or rhonchi. Extremities: No cyanosis, clubbing or edema. DP/PT pulses present and equal bilaterally. Neuro: Alert and oriented x 3. No focal deficits.  Labs: Lab Results  Component Value Date   WBC 4.3 06/05/2012   HGB 13.1 06/05/2012   HCT 38.9* 06/05/2012   MCV 83.1 06/05/2012   PLT 230 06/05/2012     Lab 06/08/12 0619  NA 138  K 4.2  CL 103  CO2 25  BUN 16  CREATININE 1.39*  CALCIUM 9.0  PROT --  BILITOT --  ALKPHOS --  ALT --  AST --  GLUCOSE 115*    Disposition:  The patient is being discharged in stable condition.  Follow-up: Follow-up Information  Follow up with Tereso Newcomer, PA on 06/15/2012. (At 9:50 AM for hospitat follow-up with 12-lead ECG, BMET, Mg)    Contact information:   [facility] [address] [place]  [address] [phone]       Follow up with Willa Rough, MD on 07/02/2012. (At 11:45 AM)    Contact information:   [facility] [address] [place] [address] [phone]       Follow up with Hillis Range, MD on 09/10/2012. (At 2:00 PM)    Contact information:   [facility] [address]  [address] [place] [address] [phone]        Discharge Medications:  Medication List  As of 06/08/2012  2:52 PM   TAKE these medications         ALPRAZolam 0.5 MG tablet   Commonly known as: XANAX   Take 1 tablet (0.5 mg total) by mouth daily as needed for anxiety.      aspirin 325 MG tablet   Take 325 mg by mouth daily.      benazepril 5 MG tablet   Commonly known as: LOTENSIN   Take 5 mg by mouth daily.      carvedilol 25 MG tablet   Commonly known as: COREG   Take 0.5 tablets (12.5 mg total) by mouth 2 (two) times daily.      furosemide 40 MG tablet   Commonly known as: LASIX   Take 1 tablet (40 mg total) by mouth daily.      potassium chloride 10 MEQ tablet   Commonly known as: K-DUR   Take 1 tablet (10 mEq total) by mouth daily.      sotalol 120 MG tablet   Commonly known as: BETAPACE   Take 1 tablet (120 mg total) by mouth every 12 (twelve) hours.          Duration of Discharge Encounter: Greater than 30 minutes including physician time.  Signed, Rick Duff, PA-C 06/08/2012, 2:52 PM  EP Attending  Patient seen and examined. I agree with the plan and decision for discharge as outlined above.  Lewayne Bunting, M.D.

## 2012-06-08 NOTE — Progress Notes (Signed)
Patient had a 13 beat run of wide-QRS complex.  Patient asymptomatic except for increased drowsiness.  Adelene Amas., PA notified.  Will continue to monitor. Nolon Nations

## 2012-06-08 NOTE — Progress Notes (Signed)
DC orders received.  Patient stable with no S/S of distress.  Discharge information (including heart failure packet) and medication instructions reviewed with patient and patient's wife.  Patient DC home with wife. Adam [name]

## 2012-06-14 ENCOUNTER — Telehealth: Payer: Self-pay | Admitting: Cardiology

## 2012-06-14 NOTE — Telephone Encounter (Signed)
New msg Pt wants calling about status for short term disability paperwork. He has appt with Tereso Newcomer tomorrow and he said he could pick up then.

## 2012-06-14 NOTE — Telephone Encounter (Signed)
Pt was notified that Dr Myrtis Ser gave the short term disablilty paperwork to Tereso Newcomer to be filled out when Mr Rone sees [facility] tomorrow.

## 2012-06-15 ENCOUNTER — Ambulatory Visit (INDEPENDENT_AMBULATORY_CARE_PROVIDER_SITE_OTHER): Payer: BC Managed Care – PPO | Admitting: Physician Assistant

## 2012-06-15 ENCOUNTER — Encounter: Payer: Self-pay | Admitting: Physician Assistant

## 2012-06-15 ENCOUNTER — Other Ambulatory Visit (INDEPENDENT_AMBULATORY_CARE_PROVIDER_SITE_OTHER): Payer: BC Managed Care – PPO

## 2012-06-15 ENCOUNTER — Telehealth: Payer: Self-pay | Admitting: *Deleted

## 2012-06-15 VITALS — BP 114/79 | HR 57 | Ht 71.0 in | Wt 226.8 lb

## 2012-06-15 DIAGNOSIS — R0989 Other specified symptoms and signs involving the circulatory and respiratory systems: Secondary | ICD-10-CM

## 2012-06-15 DIAGNOSIS — I472 Ventricular tachycardia: Secondary | ICD-10-CM

## 2012-06-15 DIAGNOSIS — I5022 Chronic systolic (congestive) heart failure: Secondary | ICD-10-CM

## 2012-06-15 DIAGNOSIS — N289 Disorder of kidney and ureter, unspecified: Secondary | ICD-10-CM

## 2012-06-15 LAB — BASIC METABOLIC PANEL
Calcium: 9.6 mg/dL (ref 8.4–10.5)
GFR: 59.91 mL/min — ABNORMAL LOW (ref >60.00)
Potassium: 4.4 mEq/L (ref 3.5–5.1)
Sodium: 134 mEq/L — ABNORMAL LOW (ref 135–145)

## 2012-06-15 LAB — MAGNESIUM: Magnesium: 1.9 mg/dL (ref 1.5–2.5)

## 2012-06-15 NOTE — Telephone Encounter (Signed)
Message copied by Tarri Fuller on Fri Jun 15, 2012  4:51 PM ------      Message from: [place], [place] T      Created: Fri Jun 15, 2012  1:35 PM       Labs stable      Plan repeat bmet and magnesium at follow up appointment in 2 weeks      Tereso Newcomer, PA-C  1:35 PM 06/15/2012

## 2012-06-15 NOTE — Progress Notes (Signed)
[address] [place], [place]  [address] Phone: [phone] Fax:  [phone]  Date:  06/15/2012   Name:  Adam [name]   DOB:  [birth year]   MRN:  [id number]  PCP:  Kirstie Peri, MD  Primary Cardiologist:  Dr. Zackery Barefoot [facility]) Primary Electrophysiologist:  Dr. Hillis Range    History of Present Illness: Adam [name] is a 46 y.o. male who returns for post hospital follow up.  He has a history of nonischemic cardiomyopathy, EF 20-25%, normal coronary arteries by cardiac catheterization in 2011, status post St. Jude ICD implantation, chronic systolic CHF and CKD.  He was admitted 7/30-8/2 with VT storm, status post multiple ICD shocks. He was transferred from [facility] after presenting to the emergency room there with 5 sudden ICD shocks. Potassium was low. This was replaced. He was placed on sotalol.   Echo 06/05/12: Mild LVH, EF 20%, posterior severe HK, anterolateral severe HK, anterior severe HK, mid to apical septal AK, AK of the true apex, apical inferior HK, restrictive physiology, trivial MR, moderate LAE, mildly reduced RV function, mild TR.  Doing well. No further ICD shocks.  ICD interrogated today and no VTach noted since 7/30.  The patient denies chest pain, shortness of breath, syncope, orthopnea, PND or significant pedal edema.   Wt Readings from Last 3 Encounters:  06/15/12 226 lb 12.8 oz (102.876 kg)  06/08/12 231 lb (104.781 kg)  01/25/12 245 lb (111.131 kg)     Past Medical History  Diagnosis Date  . Mitral regurgitation   . NICM (nonischemic cardiomyopathy)     Nonischemic / catheterization October, 2011, normal coronary arteries  . Chronic systolic heart failure   . Ejection fraction < 50%     Echo 06/05/12: Mild LVH, EF 20%, posterior severe HK, anterolateral severe HK, anterior severe HK, mid to apical septal AK, AK of the true apex, apical inferior HK, restrictive physiology, trivial MR, moderate LAE, mildly reduced RV  function, mild TR.  . ICD (implantable cardiac defibrillator) battery depletion 11/11/10    St. Jude, Dr.Allred  . Renal insufficiency     Creatinine 1.5 (GFR greater than 50)  . Headache     Patient seen to have headaches from spironolactone.  Drug was stopped, August, 2012  . Sleepiness     has some sleepiness during the day  . 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 VT (ventricular tachycardia)     VT storm 7/13 - Sotalol started    Current Outpatient Prescriptions  Medication Sig Dispense Refill  . ALPRAZolam (XANAX) 0.5 MG tablet Take 1 tablet (0.5 mg total) by mouth daily as needed for anxiety.  15 tablet  0  . aspirin 325 MG tablet Take 325 mg by mouth daily.        . benazepril (LOTENSIN) 5 MG tablet Take 5 mg by mouth daily.      . carvedilol (COREG) 25 MG tablet Take 0.5 tablets (12.5 mg total) by mouth 2 (two) times daily.  30 tablet  6  . furosemide (LASIX) 40 MG tablet Take 1 tablet (40 mg total) by mouth daily.      . potassium chloride (K-DUR) 10 MEQ tablet Take 1 tablet (10 mEq total) by mouth daily.  30 tablet  3  . sotalol (BETAPACE) 120 MG tablet Take 1 tablet (120 mg total) by mouth every 12 (twelve) hours.  60 tablet  3    Allergies: Allergies  Allergen Reactions  .  Atorvastatin     headaches    History  Substance Use Topics  . Smoking status: Never Smoker   . Smokeless tobacco: Never Used  . Alcohol Use: No     OCCASIONAL  (NOT HEAVY)     PHYSICAL EXAM: VS:  BP 114/79  Pulse 57  Ht 5\' 11"  (1.803 m)  Wt 226 lb 12.8 oz (102.876 kg)  BMI 31.63 kg/m2 Well nourished, well developed, in no acute distress HEENT: normal Neck: no JVD Cardiac:  normal S1, S2; RRR; no murmur Lungs:  clear to auscultation bilaterally, no wheezing, rhonchi or rales Abd: soft, nontender, no hepatomegaly Ext: no edema Skin: warm and dry Neuro:  CNs 2-12 intact, no focal abnormalities noted  EKG:  Sinus bradycardia, heart rate 58, inferolateral T wave  inversions, QTc 406 ms     ASSESSMENT AND PLAN:  1. Ventricular Tachycardia, s/p Storm with multiple ICD shocks No recurrence.  Tolerating Sotalol.  QTc is ok today. Check BMET and magnesium today. Discussed with Dr. Zackery Barefoot by phone. Patient works in a [employer], [employer].  He will be out of work for 6 mos and cannot drive. Long term, we believe he should pursue permanent disability.  The patient is concerned about a subsequent event happening on the job resulting in significant injury to himself or others.  This is a valid concern.  There are no other jobs for him to do.  Dr. Myrtis Ser and I were both in agreement regarding this today as well as the patient and his wife.  He will start the process. Follow up with Dr. Hillis Range as scheduled.  2.  Chronic Systolic CHF Volume stable. Follow up with Dr. Myrtis Ser in 2 weeks as scheduled.  3.  Chronic Kidney Disease Check BMET today.  Signed, Tereso Newcomer, PA-C  10:18 AM 06/15/2012

## 2012-06-15 NOTE — Telephone Encounter (Signed)
wife notified of lab results and pt to have repeat bmet, mag same day as JK appt

## 2012-06-15 NOTE — Patient Instructions (Addendum)
Your physician recommends that you return for lab work in: TODAY BMET, MAG  KEEP APPT WITH DR. KATZ 07/02/12 11:45 AM  NO CHANGES WERE MADE TODAY

## 2012-06-21 ENCOUNTER — Telehealth: Payer: Self-pay | Admitting: *Deleted

## 2012-06-21 NOTE — Telephone Encounter (Signed)
I returned pt's call from earlier today about diasability paperwork. I called pt and stated that we did not have that paperwork that we gave it back to him on 8/9 when he saw SW, PA..pt states he got everything fixed for paperwork already and it was all straightened out 

## 2012-06-21 NOTE — Telephone Encounter (Signed)
I returned pt's call from earlier today about diasability paperwork. I called pt and stated that we did not have that paperwork that we gave it back to him on 8/9 when he saw SW, PA..pt states he got everything fixed for paperwork already and it was all straightened out

## 2012-06-21 NOTE — Telephone Encounter (Signed)
Fu call Pt calling back about paperwork please call

## 2012-07-02 ENCOUNTER — Ambulatory Visit: Payer: BC Managed Care – PPO | Admitting: Cardiology

## 2012-07-02 ENCOUNTER — Other Ambulatory Visit: Payer: BC Managed Care – PPO

## 2012-07-17 ENCOUNTER — Encounter: Payer: Self-pay | Admitting: Cardiology

## 2012-07-17 DIAGNOSIS — Z79899 Other long term (current) drug therapy: Secondary | ICD-10-CM | POA: Insufficient documentation

## 2012-07-19 ENCOUNTER — Ambulatory Visit (INDEPENDENT_AMBULATORY_CARE_PROVIDER_SITE_OTHER): Payer: BC Managed Care – PPO | Admitting: Cardiology

## 2012-07-19 ENCOUNTER — Encounter: Payer: Self-pay | Admitting: Cardiology

## 2012-07-19 VITALS — BP 116/70 | HR 60 | Ht 71.0 in | Wt 231.0 lb

## 2012-07-19 DIAGNOSIS — I059 Rheumatic mitral valve disease, unspecified: Secondary | ICD-10-CM

## 2012-07-19 DIAGNOSIS — I472 Ventricular tachycardia, unspecified: Secondary | ICD-10-CM

## 2012-07-19 DIAGNOSIS — Z0271 Encounter for disability determination: Secondary | ICD-10-CM

## 2012-07-19 DIAGNOSIS — E876 Hypokalemia: Secondary | ICD-10-CM

## 2012-07-19 DIAGNOSIS — I340 Nonrheumatic mitral (valve) insufficiency: Secondary | ICD-10-CM

## 2012-07-19 DIAGNOSIS — N289 Disorder of kidney and ureter, unspecified: Secondary | ICD-10-CM

## 2012-07-19 DIAGNOSIS — I519 Heart disease, unspecified: Secondary | ICD-10-CM

## 2012-07-19 NOTE — Assessment & Plan Note (Signed)
With his diuretics there has been some mild renal insufficiency. His chemistry will be checked again today.

## 2012-07-19 NOTE — Assessment & Plan Note (Signed)
The patient has a severe nonischemic cardiomyopathy. His ejection fraction is in the 20% range. He is on all appropriate medications at this time.

## 2012-07-19 NOTE — Patient Instructions (Addendum)
Your physician recommends that you return for lab work in: today (bmet)  If you have any questions, please call Greig Castilla, Dr Henrietta Hoover nurse if you have any questions at [phone].  Tell them to page me that you are returning my call.

## 2012-07-19 NOTE — Progress Notes (Signed)
HPI   Patient returns today for followup of cardiomyopathy and episode of ventricular tachycardia storm. The patient has nonischemic cardiomyopathy. He has normal coronary arteries in the past. We have very aggressively treated him with medications for his left ventricular dysfunction. His ejection fraction remains in the 20-25% range. He received a prophylactic ICD. On June 05, 2012, he had ventricular tachycardia storm. He had multiple ICD shocks.  He was hospitalized and treated aggressively. His potassium was on the low side and it was treated. He was also started on sotalol. He was told at that time that he could not drive for 6 months. We told him also he should begin to look for short-term disability and long-term disability. His echo of June 05, 2012 again showed an ejection fraction of 20%.  Allergies  Allergen Reactions  . Atorvastatin     headaches    Current Outpatient Prescriptions  Medication Sig Dispense Refill  . aspirin 325 MG tablet Take 325 mg by mouth daily.        . benazepril (LOTENSIN) 5 MG tablet Take 5 mg by mouth daily.      . carvedilol (COREG) 25 MG tablet Take 0.5 tablets (12.5 mg total) by mouth 2 (two) times daily.  30 tablet  6  . furosemide (LASIX) 40 MG tablet Take 1 tablet (40 mg total) by mouth daily.      . potassium chloride (K-DUR) 10 MEQ tablet Take 1 tablet (10 mEq total) by mouth daily.  30 tablet  3  . sotalol (BETAPACE) 120 MG tablet Take 1 tablet (120 mg total) by mouth every 12 (twelve) hours.  60 tablet  3    History   Social History  . Marital Status: Single    Spouse Name: N/A    Number of Children: 5  . Years of Education: N/A   Occupational History  . [employer]     Full time   Social History Main Topics  . Smoking status: Never Smoker   . Smokeless tobacco: Never Used  . Alcohol Use: No     OCCASIONAL  (NOT HEAVY)  . Drug Use: Not on file  . Sexually Active: Not on file   Other Topics Concern  . Not on file   Social History  Narrative   Lives in [place], [place] with fiance.No regular exercise.    Family History  Problem Relation Age of Onset  . Heart attack Father 47    Multiple MIs  . Heart attack Brother   . Heart failure Brother     CHF  . Hypertension Mother   . Heart attack Paternal Grandfather     Past Medical History  Diagnosis Date  . Mitral regurgitation   . NICM (nonischemic cardiomyopathy)     Nonischemic / catheterization October, 2011, normal coronary arteries  . Chronic systolic heart failure   . Ejection fraction < 50%     Echo 06/05/12: Mild LVH, EF 20%, posterior severe HK, anterolateral severe HK, anterior severe HK, mid to apical septal AK, AK of the true apex, apical inferior HK, restrictive physiology, trivial MR, moderate LAE, mildly reduced RV function, mild TR.  . ICD (implantable cardiac defibrillator) battery depletion 11/11/10    St. Jude, Dr.Allred  . Renal insufficiency     Creatinine 1.5 (GFR greater than 50)  . Headache     Patient seen to have headaches from spironolactone.  Drug was stopped, August, 2012  . Sleepiness     has some sleepiness during  the day  . 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 VT (ventricular tachycardia)     VT storm 7/13 - Sotalol started  . Drug therapy     Sotalol started for VT storm  July, 2013    Past Surgical History  Procedure Date  . Tee with cardioversion 11/17/08    No LV clot  . Cardiac defibrillator placement 11/11/10    SJM Fortify DR ICD implanted by Dr Johney Frame    ROS    Patient denies fever, chills, headache, sweats, rash, change in vision, change in hearing, chest pain, cough, nausea vomiting, urinary symptoms. All other systems are reviewed and are negative.  PHYSICAL EXAM  Patient is here with his wife. He is oriented to person time and place. Affect is normal. There is no jugulovenous distention. Lungs are clear. Respiratory effort is nonlabored. Cardiac exam reveals S1 and S2. There no clicks or significant  murmurs. The abdomen is soft. There is no peripheral edema.  Filed Vitals:   07/19/12 1602  BP: 116/70  Pulse: 60  Height: 5\' 11"  (1.803 m)  Weight: 231 lb (104.781 kg)   EKG is done today and reviewed by me. In addition his ICD is interrogated.His EKG reveals sinus bradycardia. There are nonspecific ST-T wave changes. The corrected QT interval is 382 ms. His ICD interrogation shows no evidence of ventricular tachycardia.  ASSESSMENT & PLAN

## 2012-07-19 NOTE — Assessment & Plan Note (Signed)
We need to follow his potassium very carefully. It will be checked today.

## 2012-07-19 NOTE — Assessment & Plan Note (Signed)
The patient has chronic systolic CHF. His medications keep him stable. He is very careful with his salt and fluid intake. He is not volume overloaded at this time. No change in therapy.

## 2012-07-19 NOTE — Assessment & Plan Note (Signed)
The patient has mild to moderate mitral regurgitation by echo. This is to be followed.

## 2012-07-19 NOTE — Assessment & Plan Note (Signed)
The patient had ventricular tachycardia storm on June 05, 2012. He is now been stable on sotalol. His ICD was interrogated today and showed no recurrent ventricular tachycardia since the sotalol was started. This drug will be continued.

## 2012-07-19 NOTE — Assessment & Plan Note (Signed)
All of the information concerning the patient's cardiac status is included in this note. The patient has severe left ventricular dysfunction. He has been very compliant with his medications. Over time he has wanted to continue to work. He is a very reliable employee. With severe left ventricular dysfunction, we have been very careful in recommending the type of work he can do. 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 This would represent a risk to him and to other employees. I strongly recommend full short-term and long-term disability for this patient based on his cardiac status. He cannot return to work now. We will be helping him with his disability forms.

## 2012-07-20 LAB — BASIC METABOLIC PANEL
BUN: 16 mg/dL (ref 6–23)
GFR: 60.32 mL/min (ref >60.00)
Potassium: 4.4 mEq/L (ref 3.5–5.1)
Sodium: 137 mEq/L (ref 135–145)

## 2012-07-24 ENCOUNTER — Telehealth: Payer: Self-pay | Admitting: *Deleted

## 2012-07-24 NOTE — Telephone Encounter (Signed)
Message copied by Eustace Moore on Tue Jul 24, 2012  9:24 AM ------      Message from: [place], [place] D      Created: Sun Jul 22, 2012  7:01 AM       Kidney function stable

## 2012-07-24 NOTE — Telephone Encounter (Signed)
Patient informed via voicemail.

## 2012-07-31 ENCOUNTER — Telehealth: Payer: Self-pay | Admitting: Cardiology

## 2012-07-31 NOTE — Telephone Encounter (Signed)
Patient needs form filled out for full-time disability asked me to forward the paper to you.  I am faxing to your attention  He said you can contact him at home or cell number.

## 2012-07-31 NOTE — Telephone Encounter (Signed)
I spoke with Selena Batten in medical records. She states there was a form faxed today on the patient for the MD to fill out. She is holding the form to give to Urban Gibson, RN tomorrow when she returns to the office.

## 2012-07-31 NOTE — Telephone Encounter (Signed)
Physical Capacity Questionnaire faxed (came through Scheduling Fax) to [place]  For Dr.Katz to Complete, took to [facility] L/Katz  07/31/12/KM

## 2012-07-31 NOTE — Telephone Encounter (Signed)
I left a message for the patient to call. 

## 2012-07-31 NOTE — Telephone Encounter (Signed)
F/u  Patient calling regarding medical disability.  Need paper back asap.

## 2012-08-01 NOTE — Telephone Encounter (Signed)
He was calling back to check on the form he needs faxed today and he said please call home number and if he is not available it is ok to talk to his fiance Sunny Schlein

## 2012-08-01 NOTE — Telephone Encounter (Signed)
Dr Myrtis Ser not in this office to sign forms until Monday, 08/06/12.  Pt states he left a copy at the [place] office and would like to pick it up tomorrow when Dr Myrtis Ser is in that office.  I will forward this to the nurse in that office.

## 2012-08-01 NOTE — Telephone Encounter (Signed)
Will forward to [employer], LPN.

## 2012-08-03 NOTE — Telephone Encounter (Signed)
Done

## 2012-08-15 ENCOUNTER — Encounter: Payer: BC Managed Care – PPO | Admitting: Internal Medicine

## 2012-08-24 ENCOUNTER — Encounter: Payer: Self-pay | Admitting: Cardiology

## 2012-08-24 ENCOUNTER — Ambulatory Visit (INDEPENDENT_AMBULATORY_CARE_PROVIDER_SITE_OTHER): Payer: BC Managed Care – PPO | Admitting: Cardiology

## 2012-08-24 VITALS — BP 103/69 | HR 48 | Ht 71.0 in | Wt 232.4 lb

## 2012-08-24 DIAGNOSIS — N289 Disorder of kidney and ureter, unspecified: Secondary | ICD-10-CM

## 2012-08-24 DIAGNOSIS — Z79899 Other long term (current) drug therapy: Secondary | ICD-10-CM

## 2012-08-24 DIAGNOSIS — I428 Other cardiomyopathies: Secondary | ICD-10-CM

## 2012-08-24 DIAGNOSIS — I472 Ventricular tachycardia, unspecified: Secondary | ICD-10-CM

## 2012-08-24 DIAGNOSIS — E876 Hypokalemia: Secondary | ICD-10-CM

## 2012-08-24 DIAGNOSIS — I429 Cardiomyopathy, unspecified: Secondary | ICD-10-CM

## 2012-08-24 DIAGNOSIS — Z0271 Encounter for disability determination: Secondary | ICD-10-CM

## 2012-08-24 DIAGNOSIS — I059 Rheumatic mitral valve disease, unspecified: Secondary | ICD-10-CM

## 2012-08-24 DIAGNOSIS — I509 Heart failure, unspecified: Secondary | ICD-10-CM

## 2012-08-24 DIAGNOSIS — I340 Nonrheumatic mitral (valve) insufficiency: Secondary | ICD-10-CM

## 2012-08-24 NOTE — Assessment & Plan Note (Signed)
Patient is on all the medications that we can use for his LV dysfunction. He has not tolerated spironolactone in the past.

## 2012-08-24 NOTE — Patient Instructions (Addendum)
Your physician recommends that you schedule a follow-up appointment in: 3 months. Your physician recommends that you continue on your current medications as directed. Please refer to the Current Medication list given to you today.  Your physician recommends that you return for lab work in: today for BMET at [facility]

## 2012-08-24 NOTE — Assessment & Plan Note (Signed)
Potassium will be checked again today.

## 2012-08-24 NOTE — Assessment & Plan Note (Signed)
History and in of 1.6 is in his usual range. No further workup.

## 2012-08-24 NOTE — Assessment & Plan Note (Signed)
Fortunately he has had no recurrent ventricular tachycardia that has led to an ICD discharge. We will check potassium again today to be sure that his remaining above 4.0.

## 2012-08-24 NOTE — Progress Notes (Signed)
Patient ID: Adam [name], male   DOB: [birth year], 46 y.o.   MRN: [id number]   HPI Patient is seen to followup cardiomyopathy and ventricular tachycardia. When I saw him last July 19, 2012 he was posthospitalization. In July he had ventricular tachycardia storm. This was a very significant event for him. He was started on sotalol. His ejection fraction remains 20%. He is able to be up and around. His overall volume status is controlled.  He has a nonischemic cardiomyopathy. His short-term disability a work stopped. There is a 30 day delay until his work related long-term disability starts. He has applied for Social Security disability and this is being evaluated over time. I have made it very clear in all of my notes that there is no question that this gentleman is fully disabled.  Allergies  Allergen Reactions  . Atorvastatin     headaches    Current Outpatient Prescriptions  Medication Sig Dispense Refill  . aspirin 325 MG tablet Take 325 mg by mouth daily.        . benazepril (LOTENSIN) 5 MG tablet Take 5 mg by mouth daily.      . carvedilol (COREG) 25 MG tablet Take 0.5 tablets (12.5 mg total) by mouth 2 (two) times daily.  30 tablet  6  . furosemide (LASIX) 40 MG tablet Take 1 tablet (40 mg total) by mouth daily.      . potassium chloride (K-DUR) 10 MEQ tablet Take 1 tablet (10 mEq total) by mouth daily.  30 tablet  3  . sotalol (BETAPACE) 120 MG tablet Take 1 tablet (120 mg total) by mouth every 12 (twelve) hours.  60 tablet  3    History   Social History  . Marital Status: Single    Spouse Name: N/A    Number of Children: 5  . Years of Education: N/A   Occupational History  . [employer]     Full time   Social History Main Topics  . Smoking status: Never Smoker   . Smokeless tobacco: Never Used  . Alcohol Use: No     OCCASIONAL  (NOT HEAVY)  . Drug Use: Not on file  . Sexually Active: Not on file   Other Topics Concern  . Not on file   Social History Narrative   Lives in [place], [place] with fiance.No regular exercise.    Family History  Problem Relation Age of Onset  . Heart attack Father 18    Multiple MIs  . Heart attack Brother   . Heart failure Brother     CHF  . Hypertension Mother   . Heart attack Paternal Grandfather     Past Medical History  Diagnosis Date  . Mitral regurgitation   . NICM (nonischemic cardiomyopathy)     Nonischemic / catheterization October, 2011, normal coronary arteries  . Chronic systolic heart failure   . Ejection fraction < 50%     Echo 06/05/12: Mild LVH, EF 20%, posterior severe HK, anterolateral severe HK, anterior severe HK, mid to apical septal AK, AK of the true apex, apical inferior HK, restrictive physiology, trivial MR, moderate LAE, mildly reduced RV function, mild TR.  . ICD (implantable cardiac defibrillator) battery depletion 11/11/10    St. Jude, Dr.Allred  . Renal insufficiency     Creatinine 1.5 (GFR greater than 50)  . Headache     Patient seen to have headaches from spironolactone.  Drug was stopped, August, 2012  . Sleepiness     has some  sleepiness during the day  . 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 VT (ventricular tachycardia)     VT storm 7/13 - Sotalol started  . Drug therapy     Sotalol started for VT storm  July, 2013  . Hypokalemia     There was some decrease in potassium with his admission in July, 2013. We will watch this very carefully.  . Disability examination     Discussion of disability September, 2013    Past Surgical History  Procedure Date  . Tee with cardioversion 11/17/08    No LV clot  . Cardiac defibrillator placement 11/11/10    SJM Fortify DR ICD implanted by Dr Johney Frame    Patient Active Problem List  Diagnosis  . Mitral regurgitation  . Cardiomyopathy  . CHF (congestive heart failure)  . Ejection fraction < 50%  . Renal insufficiency  . Headache  . Sleepiness  . Chronic systolic dysfunction of left ventricle  . Ventricular  tachyarrhythmia  . Drug therapy  . Hypokalemia  . Disability examination    ROS    Patient denies fever, chills, headache, sweats, rash, change in vision, change in hearing, chest pain, cough, nausea vomiting, urinary symptoms. He seems to be sleepy during the day. He takes Aleve morning nap but then he is up and around and active. All other systems are reviewed and are negative.  PHYSICAL EXAM   Patient is here with his wife. He is oriented to person time and place. Affect is normal. There is no jugular venous distention. Lungs are clear. Respiratory effort is nonlabored. Cardiac exam reveals S1 and S2. There are no clicks or significant murmurs. The abdomen is soft. Is no peripheral edema.  Filed Vitals:   08/24/12 1037  BP: 103/69  Pulse: 48  Height: 5\' 11"  (1.803 m)  Weight: 232 lb 6.4 oz (105.416 kg)  SpO2: 98%   EKG is done today and reviewed by me.There is sinus bradycardia. The corrected QT interval is 490 ms.  ASSESSMENT & PLAN

## 2012-08-24 NOTE — Assessment & Plan Note (Signed)
No plan for followup echo at this time. His MR will be followed.

## 2012-08-24 NOTE — Assessment & Plan Note (Signed)
There is a long Explanation In my last note from July 19, 2012. This patient is definitely fully disabled from the cardiac viewpoint. It is not safe for him to work in any capacity as he has had ventricular tachycardia storm. We have sent in all requested information to all disability programs. Her information will be available at any time going forward.

## 2012-08-24 NOTE — Assessment & Plan Note (Signed)
His volume status is stable. No change in therapy. 

## 2012-08-24 NOTE — Assessment & Plan Note (Signed)
Patient continues on sotalol. His QT interval is acceptable.

## 2012-08-27 ENCOUNTER — Other Ambulatory Visit: Payer: Self-pay | Admitting: *Deleted

## 2012-08-27 ENCOUNTER — Ambulatory Visit: Payer: BC Managed Care – PPO | Admitting: Cardiology

## 2012-08-27 MED ORDER — POTASSIUM CHLORIDE ER 10 MEQ PO TBCR: 10.0000 meq | EXTENDED_RELEASE_TABLET | Freq: Every day | ORAL | Status: DC

## 2012-08-27 MED ORDER — SOTALOL HCL 120 MG PO TABS: 120.0000 mg | ORAL_TABLET | Freq: Two times a day (BID) | ORAL | Status: DC

## 2012-08-27 MED ORDER — CARVEDILOL 25 MG PO TABS: 12.5000 mg | ORAL_TABLET | Freq: Two times a day (BID) | ORAL | Status: DC

## 2012-08-28 ENCOUNTER — Telehealth: Payer: Self-pay | Admitting: *Deleted

## 2012-08-28 NOTE — Telephone Encounter (Signed)
Message copied by Eustace Moore on Tue Aug 28, 2012  9:11 AM ------      Message from: [place], [place] D      Created: Mon Aug 27, 2012  5:06 PM       Renal function is stable. No change

## 2012-08-28 NOTE — Telephone Encounter (Signed)
Patient's wife informed

## 2012-08-29 ENCOUNTER — Other Ambulatory Visit: Payer: Self-pay | Admitting: Cardiology

## 2012-08-29 MED ORDER — BENAZEPRIL HCL 5 MG PO TABS: 5.0000 mg | ORAL_TABLET | Freq: Every day | ORAL | Status: DC

## 2012-09-04 ENCOUNTER — Ambulatory Visit: Payer: BC Managed Care – PPO | Admitting: Cardiology

## 2012-09-04 ENCOUNTER — Encounter: Payer: Self-pay | Admitting: *Deleted

## 2012-09-04 DIAGNOSIS — Z95810 Presence of automatic (implantable) cardiac defibrillator: Secondary | ICD-10-CM | POA: Insufficient documentation

## 2012-09-08 ENCOUNTER — Other Ambulatory Visit: Payer: Self-pay | Admitting: Cardiology

## 2012-09-10 ENCOUNTER — Ambulatory Visit (INDEPENDENT_AMBULATORY_CARE_PROVIDER_SITE_OTHER): Payer: BC Managed Care – PPO | Admitting: Internal Medicine

## 2012-09-10 ENCOUNTER — Encounter: Payer: Self-pay | Admitting: Internal Medicine

## 2012-09-10 ENCOUNTER — Telehealth: Payer: Self-pay | Admitting: *Deleted

## 2012-09-10 VITALS — BP 120/80 | HR 44 | Ht 71.0 in | Wt 235.8 lb

## 2012-09-10 DIAGNOSIS — I429 Cardiomyopathy, unspecified: Secondary | ICD-10-CM

## 2012-09-10 DIAGNOSIS — I428 Other cardiomyopathies: Secondary | ICD-10-CM

## 2012-09-10 DIAGNOSIS — I509 Heart failure, unspecified: Secondary | ICD-10-CM

## 2012-09-10 MED ORDER — POTASSIUM CHLORIDE ER 10 MEQ PO TBCR: 10.0000 meq | EXTENDED_RELEASE_TABLET | Freq: Every day | ORAL | Status: DC

## 2012-09-10 MED ORDER — FUROSEMIDE 40 MG PO TABS: 40.0000 mg | ORAL_TABLET | Freq: Two times a day (BID) | ORAL | Status: DC

## 2012-09-10 MED ORDER — BENAZEPRIL HCL 5 MG PO TABS: 5.0000 mg | ORAL_TABLET | Freq: Every day | ORAL | Status: DC

## 2012-09-10 MED ORDER — CARVEDILOL 12.5 MG PO TABS: 12.5000 mg | ORAL_TABLET | Freq: Two times a day (BID) | ORAL | Status: DC

## 2012-09-10 MED ORDER — SOTALOL HCL 120 MG PO TABS: 120.0000 mg | ORAL_TABLET | Freq: Two times a day (BID) | ORAL | Status: DC

## 2012-09-10 NOTE — Patient Instructions (Signed)
Continue all current medications. Merlin phone check - 12/17/2012 Dr. Allred - 6 months 

## 2012-09-10 NOTE — Telephone Encounter (Signed)
Called pharmacy they said the don't need a refill on this med and they will disregard the one i sent in

## 2012-09-10 NOTE — Telephone Encounter (Signed)
Sent in a prescirption for Furosemide for 1 tab bid Called pharmacy to cancel. Will route this to Medical assist in regards of how patient is suppose to be taking this med if its 1 tab po qd or 1 tab bid.

## 2012-09-10 NOTE — Progress Notes (Signed)
PCP: Kirstie Peri, MD Primary Cardiologist: Dr Georgeann Oppenheim Hall is a 46 y.o. male who presents today for routine electrophysiology followup.  Presently, he is doing very well.   He denies any further episodes of symptomatic VT or ICD therapies since his recent hospitalization and initiation on sotalol.  Today, he denies symptoms of palpitations, chest pain,   lower extremity edema, dizziness, presyncope, syncope, or ICD shocks.  He is walking 1 mile per day and feels that his dyspnea continues to improve.  The patient is otherwise without complaint today.   Past Medical History  Diagnosis Date  . Mitral regurgitation   . NICM (nonischemic cardiomyopathy)     Nonischemic / catheterization October, 2011, normal coronary arteries  . Chronic systolic heart failure   . Ejection fraction < 50%     Echo 06/05/12: Mild LVH, EF 20%, posterior severe HK, anterolateral severe HK, anterior severe HK, mid to apical septal AK, AK of the true apex, apical inferior HK, restrictive physiology, trivial MR, moderate LAE, mildly reduced RV function, mild TR.  . ICD (implantable cardiac defibrillator) battery depletion 11/11/10    St. Jude, Dr.Parveen Freehling  . Renal insufficiency     Creatinine 1.5 (GFR greater than 50)  . Headache     Patient seen to have headaches from spironolactone.  Drug was stopped, August, 2012  . Sleepiness     has some sleepiness during the day  . 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 VT (ventricular tachycardia)     VT storm 7/13 - Sotalol started  . Drug therapy     Sotalol started for VT storm  July, 2013  . Hypokalemia     There was some decrease in potassium with his admission in July, 2013. We will watch this very carefully.  . Disability examination     Discussion of disability September, 2013   Past Surgical History  Procedure Date  . Tee with cardioversion 11/17/08    No LV clot  . Cardiac defibrillator placement 11/11/10    SJM Fortify DR ICD implanted by Dr  Johney Frame    Current Outpatient Prescriptions  Medication Sig Dispense Refill  . aspirin 325 MG tablet Take 325 mg by mouth daily.        . benazepril (LOTENSIN) 5 MG tablet TAKE ONE TABLET BY MOUTH EVERY DAY  30 tablet  5  . carvedilol (COREG) 25 MG tablet Take 0.5 tablets (12.5 mg total) by mouth 2 (two) times daily.  90 tablet  3  . furosemide (LASIX) 40 MG tablet Take 1 tablet (40 mg total) by mouth daily.      . potassium chloride (K-DUR) 10 MEQ tablet Take 1 tablet (10 mEq total) by mouth daily.  90 tablet  3  . sotalol (BETAPACE) 120 MG tablet Take 1 tablet (120 mg total) by mouth every 12 (twelve) hours.  180 tablet  3  . [DISCONTINUED] benazepril (LOTENSIN) 5 MG tablet Take 1 tablet (5 mg total) by mouth daily.  30 tablet  6    Physical Exam: Filed Vitals:   09/10/12 1357  BP: 120/80  Pulse: 44  Height: 5\' 11"  (1.803 m)  Weight: 235 lb 12.8 oz (106.958 kg)  SpO2: 98%    GEN- The patient is well appearing, alert and oriented x 3 today.   Head- normocephalic, atraumatic Eyes-  Sclera clear, conjunctiva pink Ears- hearing intact Oropharynx- clear Lungs- Clear to ausculation bilaterally, normal work of breathing Chest- ICD pocket is well healed  Heart- Regular rate and rhythm, no murmurs, rubs or gallops, PMI not laterally displaced GI- soft, NT, ND, + BS Extremities- no clubbing, cyanosis, or edema  ICD interrogation- reviewed in detail today,  See PACEART report ekg and labs 08/24/12 reviewed  Assessment and Plan:  1.  Chronic systolic dysfunction euvolemic today Stable on an appropriate medical regimen  2. VT Controlled with sotalol qtc is stable Normal ICD function See Pace Art report No changes today  Merlin checks every 3 months (he has a cell adapter) Return to see me in 6 months

## 2012-11-05 ENCOUNTER — Telehealth: Payer: Self-pay | Admitting: Cardiology

## 2012-11-05 MED ORDER — POTASSIUM CHLORIDE ER 10 MEQ PO TBCR: 10.0000 meq | EXTENDED_RELEASE_TABLET | Freq: Every day | ORAL | Status: DC

## 2012-11-05 MED ORDER — FUROSEMIDE 40 MG PO TABS: 40.0000 mg | ORAL_TABLET | Freq: Two times a day (BID) | ORAL | Status: DC

## 2012-11-05 MED ORDER — SOTALOL HCL 120 MG PO TABS: 120.0000 mg | ORAL_TABLET | Freq: Two times a day (BID) | ORAL | Status: DC

## 2012-11-05 NOTE — Telephone Encounter (Signed)
Informed pt that medication was sent in to the [facility] and I informed patient that the only thing we could recommend is that he go to the health department; but he has already been there.

## 2012-11-05 NOTE — Telephone Encounter (Signed)
I had a phone call today from Adam [name].He states that he called [place] [employer] today and they told Him they have not received any records from [place] regarding his disability. Can you check and verify If records have been submitted to [place] [employer].

## 2012-11-05 NOTE — Telephone Encounter (Signed)
Adam [name] insurance has expired. He went to [facility] today trying to get help to pay for His prescriptions. He states that he is looking at least 3 weeks before Health Dept can help with his medications. He needs Lasix, Betapace, Potassium.

## 2012-11-06 NOTE — Telephone Encounter (Signed)
Message left on front staff voicemail from patient that he couldn't afford his medications. Nurse returned call and patient stated that he has already taken care of getting medications.

## 2012-11-30 ENCOUNTER — Encounter: Payer: Self-pay | Admitting: *Deleted

## 2012-11-30 ENCOUNTER — Encounter: Payer: Self-pay | Admitting: Cardiology

## 2012-11-30 ENCOUNTER — Ambulatory Visit (INDEPENDENT_AMBULATORY_CARE_PROVIDER_SITE_OTHER): Payer: Self-pay | Admitting: Cardiology

## 2012-11-30 VITALS — BP 113/78 | HR 54 | Ht 71.0 in | Wt 235.0 lb

## 2012-11-30 DIAGNOSIS — I472 Ventricular tachycardia: Secondary | ICD-10-CM

## 2012-11-30 DIAGNOSIS — E876 Hypokalemia: Secondary | ICD-10-CM

## 2012-11-30 DIAGNOSIS — I428 Other cardiomyopathies: Secondary | ICD-10-CM

## 2012-11-30 DIAGNOSIS — I429 Cardiomyopathy, unspecified: Secondary | ICD-10-CM

## 2012-11-30 DIAGNOSIS — Z0271 Encounter for disability determination: Secondary | ICD-10-CM

## 2012-11-30 DIAGNOSIS — I5022 Chronic systolic (congestive) heart failure: Secondary | ICD-10-CM

## 2012-11-30 DIAGNOSIS — I509 Heart failure, unspecified: Secondary | ICD-10-CM

## 2012-11-30 NOTE — Assessment & Plan Note (Signed)
Chemistry and potassium will be checked in the near future again.

## 2012-11-30 NOTE — Patient Instructions (Addendum)
Your physician recommends that you schedule a follow-up appointment in: 6 months. You will receive a reminder letter in the mail in about 4 months reminding you to call and schedule your appointment. If you don't receive this letter, please contact our office.  Your physician recommends that you continue on your current medications as directed. Please refer to the Current Medication list given to you today.  Your physician recommends that you return for lab work today at [facility] for BMET. 

## 2012-11-30 NOTE — Assessment & Plan Note (Signed)
This patient is totally disabled. There is absolutely no question about this. This is well documented. He has severe left ventricular dysfunction and he has had ventricular tachycardia storm in the past. We will do everything we can to be sure that papers are being completed properly.

## 2012-11-30 NOTE — Progress Notes (Signed)
HPI   Patient is seen to followup cardiomyopathy. Fortunately his rhythm has been stable. He has been seen by Dr. Johney Frame in his ICD is working well. He has not had any recurrent discharges on sotalol. He's tolerating the medication. He's not having any significant chest pain or marked shortness of breath.  This patient is disabled on a cardiac basis from any type of work. I have tried to make this clear on a repeated basis. We need to be sure that all forms have been completed to the best of our ability.  Allergies  Allergen Reactions  . Atorvastatin     headaches    Current Outpatient Prescriptions  Medication Sig Dispense Refill  . aspirin 325 MG tablet Take 325 mg by mouth daily.        . benazepril (LOTENSIN) 5 MG tablet Take 1 tablet (5 mg total) by mouth daily.  90 tablet  3  . carvedilol (COREG) 12.5 MG tablet Take 1 tablet (12.5 mg total) by mouth 2 (two) times daily.  180 tablet  3  . furosemide (LASIX) 40 MG tablet TAKE ONE TABLET BY MOUTH TWICE DAILY  60 tablet  5  . furosemide (LASIX) 40 MG tablet Take 1 tablet (40 mg total) by mouth 2 (two) times daily.  180 tablet  0  . potassium chloride (K-DUR) 10 MEQ tablet Take 1 tablet (10 mEq total) by mouth daily.  90 tablet  0  . sotalol (BETAPACE) 120 MG tablet Take 1 tablet (120 mg total) by mouth every 12 (twelve) hours.  180 tablet  0    History   Social History  . Marital Status: Single    Spouse Name: N/A    Number of Children: 5  . Years of Education: N/A   Occupational History  . [employer]     Full time   Social History Main Topics  . Smoking status: Never Smoker   . Smokeless tobacco: Never Used  . Alcohol Use: No     Comment: OCCASIONAL  (NOT HEAVY)  . Drug Use: Not on file  . Sexually Active: Not on file   Other Topics Concern  . Not on file   Social History Narrative   Lives in [place], [place] with fiance.No regular exercise.    Family History  Problem Relation Age of Onset  . Heart attack Father 32   Multiple MIs  . Heart attack Brother   . Heart failure Brother     CHF  . Hypertension Mother   . Heart attack Paternal Grandfather     Past Medical History  Diagnosis Date  . Mitral regurgitation   . NICM (nonischemic cardiomyopathy)     Nonischemic / catheterization October, 2011, normal coronary arteries  . Chronic systolic heart failure   . Ejection fraction < 50%     Echo 06/05/12: Mild LVH, EF 20%, posterior severe HK, anterolateral severe HK, anterior severe HK, mid to apical septal AK, AK of the true apex, apical inferior HK, restrictive physiology, trivial MR, moderate LAE, mildly reduced RV function, mild TR.  . ICD (implantable cardiac defibrillator) battery depletion 11/11/10    St. Jude, Dr.Allred  . Renal insufficiency     Creatinine 1.5 (GFR greater than 50)  . Headache     Patient seen to have headaches from spironolactone.  Drug was stopped, August, 2012  . Sleepiness     has some sleepiness during the day  . 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 VT (  ventricular tachycardia)     VT storm 7/13 - Sotalol started  . Drug therapy     Sotalol started for VT storm  July, 2013  . Hypokalemia     There was some decrease in potassium with his admission in July, 2013. We will watch this very carefully.  . Disability examination     Discussion of disability September, 2013    Past Surgical History  Procedure Date  . Tee with cardioversion 11/17/08    No LV clot  . Cardiac defibrillator placement 11/11/10    SJM Fortify DR ICD implanted by Dr Johney Frame    Patient Active Problem List  Diagnosis  . Mitral regurgitation  . Cardiomyopathy  . CHF (congestive heart failure)  . Ejection fraction < 50%  . Renal insufficiency  . Headache  . Sleepiness  . Chronic systolic dysfunction of left ventricle  . Ventricular tachyarrhythmia  . Drug therapy  . Hypokalemia  . Disability examination  . ICD-St.Jude    ROS   Patient denies fever, chills, headache,  sweats, rash, change in vision, change in hearing, chest pain, cough, nausea vomiting, urinary symptoms. All other systems are reviewed and are negative.  PHYSICAL EXAM   Patient is here with his wife. He is oriented to person time and place. Affect is normal. There is no jugular venous distention. Lungs are clear. Respiratory effort is nonlabored. Cardiac exam reveals S1 and S2. There no clicks or significant murmurs. The abdomen is soft. There is no peripheral edema. There are no musculoskeletal deformities. There are no skin rashes.  Filed Vitals:   11/30/12 1048  BP: 113/78  Pulse: 54  Height: 5\' 11"  (1.803 m)  Weight: 235 lb (106.595 kg)   EKG is done today and reviewed by me. There is mild sinus bradycardia. There is decreased anterior R wave progression. There is no significant change.  ASSESSMENT & PLAN

## 2012-11-30 NOTE — Assessment & Plan Note (Signed)
Patient is on appropriate medications on the maximum doses that he has been able to tolerate. We cannot use spironolactone due to for prior difficulties. No change in therapy.

## 2012-11-30 NOTE — Assessment & Plan Note (Signed)
He has not had any recurrent ventricular tachycardia. He is being followed by the EP team. It will be very important to continue be sure that his potassium is normal.

## 2012-11-30 NOTE — Assessment & Plan Note (Signed)
His volume status is stable. No change in therapy. 

## 2012-12-07 ENCOUNTER — Telehealth: Payer: Self-pay | Admitting: *Deleted

## 2012-12-07 NOTE — Addendum Note (Signed)
Addended by: Eustace Moore on: 12/07/2012 01:24 PM   Modules accepted: Orders

## 2012-12-07 NOTE — Telephone Encounter (Signed)
Left message for patient to call office r/e fax received today from health department and new prescription he go for potassium. FYI- this prescription was sent also with packet.

## 2012-12-10 ENCOUNTER — Telehealth: Payer: Self-pay | Admitting: *Deleted

## 2012-12-10 NOTE — Telephone Encounter (Signed)
Informed wife of patient that this prescription was sent with packet.

## 2012-12-10 NOTE — Telephone Encounter (Signed)
Patient informed. 

## 2012-12-10 NOTE — Telephone Encounter (Signed)
Message copied by Eustace Moore on Mon Dec 10, 2012  9:59 AM ------      Message from: Myrtis Ser, [place] D      Created: Wed Dec 05, 2012  2:39 PM       Lab is okay

## 2012-12-11 ENCOUNTER — Telehealth: Payer: Self-pay | Admitting: Physician Assistant

## 2012-12-11 NOTE — Telephone Encounter (Signed)
Received telephone call today from [place] [employer] in regards to medical records on Adam [name] . States that he has faxed Request twice and he receives a response back that records have been sent. States that as of today he has no medical Records on Adam [name].  He is requesting records be faxed to him directly [phone] attention: Adam [name] [address]. States That he needs these records by Friday 12-14-2012 will forward information to Medical Records .Please call Adam [name] at [facility] [phone] ext [phone]. Spoke with Adam [name] also and verified with him that I would send information  To our Medical records department on [address].

## 2012-12-12 ENCOUNTER — Telehealth: Payer: Self-pay | Admitting: Cardiology

## 2012-12-12 NOTE — Telephone Encounter (Signed)
N/A.  LMTC. 

## 2012-12-12 NOTE — Telephone Encounter (Signed)
New Problem    Pt calling back regarding mess from yesterday (about disability updated info).

## 2012-12-12 NOTE — Telephone Encounter (Signed)
Pt just heard from disability and they need updated info, pls call pt

## 2012-12-12 NOTE — Telephone Encounter (Signed)
Pt was notified that the [facility] clinic sent a message to medical records for this to be done by Friday.

## 2012-12-13 NOTE — Telephone Encounter (Signed)
LM for medical records to find out status of records.

## 2012-12-17 ENCOUNTER — Encounter: Payer: BC Managed Care – PPO | Admitting: *Deleted

## 2012-12-22 ENCOUNTER — Encounter: Payer: Self-pay | Admitting: *Deleted

## 2013-01-15 ENCOUNTER — Other Ambulatory Visit: Payer: Self-pay | Admitting: *Deleted

## 2013-01-15 ENCOUNTER — Telehealth: Payer: Self-pay | Admitting: Internal Medicine

## 2013-01-15 MED ORDER — BENAZEPRIL HCL 5 MG PO TABS: 5.0000 mg | ORAL_TABLET | Freq: Every day | ORAL | Status: DC

## 2013-01-15 NOTE — Telephone Encounter (Signed)
CALLING TO GET A  VERBLE ON ENEZAPRIL

## 2013-01-16 ENCOUNTER — Other Ambulatory Visit (HOSPITAL_COMMUNITY): Payer: Self-pay | Admitting: *Deleted

## 2013-01-16 DIAGNOSIS — N189 Chronic kidney disease, unspecified: Secondary | ICD-10-CM

## 2013-01-16 DIAGNOSIS — I509 Heart failure, unspecified: Secondary | ICD-10-CM

## 2013-01-16 DIAGNOSIS — I219 Acute myocardial infarction, unspecified: Secondary | ICD-10-CM

## 2013-01-17 ENCOUNTER — Ambulatory Visit (HOSPITAL_COMMUNITY)
Admission: RE | Admit: 2013-01-17 | Discharge: 2013-01-17 | Disposition: A | Discharge status: Home / Self Care | Payer: Disability Insurance | Source: Ambulatory Visit | Attending: Cardiovascular Disease | Admitting: Cardiovascular Disease

## 2013-01-17 DIAGNOSIS — I509 Heart failure, unspecified: Secondary | ICD-10-CM

## 2013-01-17 DIAGNOSIS — I252 Old myocardial infarction: Secondary | ICD-10-CM | POA: Insufficient documentation

## 2013-01-17 DIAGNOSIS — I219 Acute myocardial infarction, unspecified: Secondary | ICD-10-CM

## 2013-01-17 DIAGNOSIS — N189 Chronic kidney disease, unspecified: Secondary | ICD-10-CM

## 2013-01-17 NOTE — Progress Notes (Signed)
2D Echo Performed 01/17/2013    Tammie Crouch, RCS  

## 2013-03-11 ENCOUNTER — Telehealth: Payer: Self-pay | Admitting: Physician Assistant

## 2013-03-11 ENCOUNTER — Ambulatory Visit: Payer: PRIVATE HEALTH INSURANCE | Admitting: Physician Assistant

## 2013-03-21 NOTE — Telephone Encounter (Signed)
Err

## 2013-03-28 ENCOUNTER — Encounter: Payer: Self-pay | Admitting: Internal Medicine

## 2013-03-28 ENCOUNTER — Ambulatory Visit (INDEPENDENT_AMBULATORY_CARE_PROVIDER_SITE_OTHER): Payer: PRIVATE HEALTH INSURANCE | Admitting: Internal Medicine

## 2013-03-28 VITALS — BP 124/75 | HR 63 | Ht 70.0 in | Wt 244.8 lb

## 2013-03-28 DIAGNOSIS — I519 Heart disease, unspecified: Secondary | ICD-10-CM

## 2013-03-28 DIAGNOSIS — I428 Other cardiomyopathies: Secondary | ICD-10-CM

## 2013-03-28 DIAGNOSIS — Z95810 Presence of automatic (implantable) cardiac defibrillator: Secondary | ICD-10-CM

## 2013-03-28 DIAGNOSIS — I472 Ventricular tachycardia, unspecified: Secondary | ICD-10-CM

## 2013-03-28 DIAGNOSIS — N289 Disorder of kidney and ureter, unspecified: Secondary | ICD-10-CM

## 2013-03-28 DIAGNOSIS — N189 Chronic kidney disease, unspecified: Secondary | ICD-10-CM

## 2013-03-28 DIAGNOSIS — I429 Cardiomyopathy, unspecified: Secondary | ICD-10-CM

## 2013-03-28 DIAGNOSIS — I509 Heart failure, unspecified: Secondary | ICD-10-CM

## 2013-03-28 NOTE — Patient Instructions (Signed)
   Labs for BMET, Magnesium   Office will call with results  Continue all current medications. Allred - 1 year Belenda Cruise - 3 months

## 2013-03-28 NOTE — Progress Notes (Signed)
PCP: Kirstie Peri, MD Primary Cardiologist: Adam [name] is a 47 y.o. male who presents today for routine electrophysiology followup.  Presently, he is doing very well.   He denies any further episodes of symptomatic VT or ICD therapies since last visit.  Today, he denies symptoms of palpitations, chest pain, lower extremity edema, dizziness, presyncope, syncope, or ICD shocks.  The patient is otherwise without complaint today.   Past Medical History  Diagnosis Date  . Mitral regurgitation   . NICM (nonischemic cardiomyopathy)     Nonischemic / catheterization October, 2011, normal coronary arteries  . Chronic systolic heart failure   . Ejection fraction < 50%     Echo 06/05/12: Mild LVH, EF 20%, posterior severe HK, anterolateral severe HK, anterior severe HK, mid to apical septal AK, AK of the true apex, apical inferior HK, restrictive physiology, trivial MR, moderate LAE, mildly reduced RV function, mild TR.  . ICD (implantable cardiac defibrillator) battery depletion 11/11/10    St. Jude, Adam.Stephanieann Popescu  . Renal insufficiency     Creatinine 1.5 (GFR greater than 50)  . Headache     Patient seen to have headaches from spironolactone.  Drug was stopped, August, 2012  . Sleepiness     has some sleepiness during the day  . 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 VT (ventricular tachycardia)     VT storm 7/13 - Sotalol started  . Drug therapy     Sotalol started for VT storm  July, 2013  . Hypokalemia     There was some decrease in potassium with his admission in July, 2013. We will watch this very carefully.  . Disability examination     Discussion of disability September, 2013   Past Surgical History  Procedure Laterality Date  . Tee with cardioversion  11/17/08    No LV clot  . Cardiac defibrillator placement  11/11/10    SJM Fortify Adam ICD implanted by Adam Johney Frame    Current Outpatient Prescriptions  Medication Sig Dispense Refill  . aspirin 325 MG tablet Take  325 mg by mouth daily.        . benazepril (LOTENSIN) 5 MG tablet Take 1 tablet (5 mg total) by mouth daily.  90 tablet  3  . carvedilol (COREG) 12.5 MG tablet Take 1 tablet (12.5 mg total) by mouth 2 (two) times daily.  180 tablet  3  . furosemide (LASIX) 40 MG tablet TAKE ONE TABLET BY MOUTH TWICE DAILY  60 tablet  5  . potassium chloride (K-DUR) 10 MEQ tablet Take 1 tablet (10 mEq total) by mouth daily.  90 tablet  0  . sotalol (BETAPACE) 120 MG tablet Take 1 tablet (120 mg total) by mouth every 12 (twelve) hours.  180 tablet  0   No current facility-administered medications for this visit.    Physical Exam: Filed Vitals:   03/28/13 1618  BP: 124/75  Pulse: 63  Height: 5\' 10"  (1.778 m)  Weight: 244 lb 12.8 oz (111.041 kg)    GEN- The patient is well appearing, alert and oriented x 3 today.   Head- normocephalic, atraumatic Eyes-  Sclera clear, conjunctiva pink Ears- hearing intact Oropharynx- clear Lungs- Clear to ausculation bilaterally, normal work of breathing Chest- ICD pocket is well healed Heart- Regular rate and rhythm, no murmurs, rubs or gallops, PMI not laterally displaced GI- soft, NT, ND, + BS Extremities- no clubbing, cyanosis, or edema  ICD interrogation- reviewed in detail today,  See  PACEART report ekg and labs 1/14 reviewed ekg today reveals sinus rhythm, inferior infaction, QTc 427  Assessment and Plan:  1.  Chronic systolic dysfunction euvolemic today Stable on an appropriate medical regimen creatinine 1/14 was 1.7.  I will repeat BMET today Consider decreasing lasix to daily if creatinine is still elevated 2gram sodium diet  2. VT Controlled with sotalol qtc is stable Normal ICD function See Pace Art report No changes today  He has been noncompliant with Merlin checks every 3 months (he was given a cell adapter 2013) Return to see me in 12 months

## 2013-04-02 ENCOUNTER — Encounter: Payer: Self-pay | Admitting: Internal Medicine

## 2013-04-04 ENCOUNTER — Telehealth: Payer: Self-pay | Admitting: Cardiology

## 2013-04-04 ENCOUNTER — Encounter: Payer: Self-pay | Admitting: *Deleted

## 2013-04-04 NOTE — Telephone Encounter (Signed)
Pt called because he  had applied for disability 2 times and was denied; this will be the 3 rd time' so pt's  lawyer would like for Dr. Myrtis Ser to write a letter stating that "pt has been out of his job for some time, and he is unable to do any type of work". This letter can be mail to pt's address; Pt will take the letter to his lawyer. Pt would like for this letter do be done soon.

## 2013-04-04 NOTE — Telephone Encounter (Signed)
New problem   Pt stated he need a letter stating his condition per his lawyer. Stating that pt isn't able to do any work. Please call pt.

## 2013-04-04 NOTE — Telephone Encounter (Signed)
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 Nurse informed patient that letter is available and will be upfront for pick up.

## 2013-04-10 ENCOUNTER — Encounter: Payer: Self-pay | Admitting: *Deleted

## 2013-04-24 ENCOUNTER — Encounter: Payer: Self-pay | Admitting: Cardiology

## 2013-04-26 ENCOUNTER — Encounter: Payer: Self-pay | Admitting: Cardiology

## 2013-04-26 ENCOUNTER — Ambulatory Visit (INDEPENDENT_AMBULATORY_CARE_PROVIDER_SITE_OTHER): Payer: PRIVATE HEALTH INSURANCE | Admitting: Cardiology

## 2013-04-26 VITALS — BP 114/83 | HR 47 | Ht 71.0 in | Wt 242.0 lb

## 2013-04-26 DIAGNOSIS — I5023 Acute on chronic systolic (congestive) heart failure: Secondary | ICD-10-CM | POA: Insufficient documentation

## 2013-04-26 DIAGNOSIS — R001 Bradycardia, unspecified: Secondary | ICD-10-CM | POA: Insufficient documentation

## 2013-04-26 DIAGNOSIS — I509 Heart failure, unspecified: Secondary | ICD-10-CM

## 2013-04-26 DIAGNOSIS — Z79899 Other long term (current) drug therapy: Secondary | ICD-10-CM

## 2013-04-26 DIAGNOSIS — I429 Cardiomyopathy, unspecified: Secondary | ICD-10-CM

## 2013-04-26 DIAGNOSIS — Z0271 Encounter for disability determination: Secondary | ICD-10-CM

## 2013-04-26 DIAGNOSIS — I498 Other specified cardiac arrhythmias: Secondary | ICD-10-CM

## 2013-04-26 DIAGNOSIS — I5022 Chronic systolic (congestive) heart failure: Secondary | ICD-10-CM

## 2013-04-26 DIAGNOSIS — I428 Other cardiomyopathies: Secondary | ICD-10-CM

## 2013-04-26 DIAGNOSIS — R0789 Other chest pain: Secondary | ICD-10-CM

## 2013-04-26 MED ORDER — OMEPRAZOLE 20 MG PO CPDR: 20.0000 mg | DELAYED_RELEASE_CAPSULE | Freq: Every day | ORAL | Status: DC

## 2013-04-26 MED ORDER — FUROSEMIDE 40 MG PO TABS: 40.0000 mg | ORAL_TABLET | Freq: Every day | ORAL | Status: DC

## 2013-04-26 MED ORDER — POTASSIUM CHLORIDE ER 10 MEQ PO TBCR: 10.0000 meq | EXTENDED_RELEASE_TABLET | ORAL | Status: DC

## 2013-04-26 MED ORDER — CARVEDILOL 6.25 MG PO TABS: 6.2500 mg | ORAL_TABLET | Freq: Two times a day (BID) | ORAL | Status: DC
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
## 2013-04-26 NOTE — Assessment & Plan Note (Signed)
At this time his volume is controlled. He is having some dizziness. I've chosen to decrease his Lasix dose from 80-40 daily.

## 2013-04-26 NOTE — Assessment & Plan Note (Signed)
I repeated again that this gentleman is disabled.

## 2013-04-26 NOTE — Assessment & Plan Note (Signed)
His resting heart rate is 47. This may be making him feel poorly. Sotalol place and additional role with his bradycardia. I will reduce his carvedilol dose.

## 2013-04-26 NOTE — Assessment & Plan Note (Signed)
He continues to take sotalol for ventricular tachycardia storm. He was assessed by Dr. Johney Frame and he has not had any recurrent ventricular tachycardia on the sotalol.

## 2013-04-26 NOTE — Patient Instructions (Addendum)
Your physician recommends that you schedule a follow-up appointment in: 8-10 weeks. Your physician has recommended you make the following change in your medication:   DECREASE ASPIRIN TO 81 MG DAILY             DECREASE FUROSEMIDE TO 40 MG DAILY             DECREASE POTASSIUM CHLORIDE 10 MEQ TO 3 TIMES PER WEEK             DECREASE CARVEDILOL TO 6. 25 MG TWICE DAILY. YOU MAY BREAK YOUR 12.5 MG IN HALF UNTIL THEY ARE FINISHED.             START OMEPRAZOLE 20 MG TWICE DAILY, 30 MINUTES BEFORE BREAKFAST AND SUPPER.              All other medications will remain the same.  Please call our office in about 2-3 weeks with the status of your symptoms.

## 2013-04-26 NOTE — Assessment & Plan Note (Addendum)
It has been repeatedly documented that the patient has a severe cardiomyopathy. He has normal coronary arteries. I have done the best I can to adjust his medications. He is disabled.  As part of today's evaluation I spent greater than 25 minutes on the patient's total care. More than half of this time was spent with direct contact in making decisions with him and his wife about the approach to his care.

## 2013-04-29 MED ORDER — OMEPRAZOLE 20 MG PO CPDR: 20.0000 mg | DELAYED_RELEASE_CAPSULE | Freq: Two times a day (BID) | ORAL | Status: DC

## 2013-04-29 NOTE — Addendum Note (Signed)
Addended by: Eustace Moore on: 04/29/2013 12:08 PM   Modules accepted: Orders

## 2013-05-03 ENCOUNTER — Encounter: Payer: Self-pay | Admitting: Physician Assistant

## 2013-05-03 DIAGNOSIS — R079 Chest pain, unspecified: Secondary | ICD-10-CM

## 2013-05-03 DIAGNOSIS — R0602 Shortness of breath: Secondary | ICD-10-CM

## 2013-05-03 DIAGNOSIS — I428 Other cardiomyopathies: Secondary | ICD-10-CM

## 2013-05-03 DIAGNOSIS — I5022 Chronic systolic (congestive) heart failure: Secondary | ICD-10-CM

## 2013-05-06 DIAGNOSIS — R072 Precordial pain: Secondary | ICD-10-CM

## 2013-05-07 ENCOUNTER — Telehealth: Payer: Self-pay | Admitting: *Deleted

## 2013-05-07 NOTE — Telephone Encounter (Signed)
Patient was d/c from [facility] on carvedilol 3.125 mg twice daily and at home has on hand 12.5 mg. Patient wanted to know if he could use this to keep from buying new medication. Nurse advised him that he would have to break it in 1/2 twice to get the correct dosage. Patient said he would go ahead and get new prescription filled.

## 2013-05-13 ENCOUNTER — Telehealth: Payer: Self-pay | Admitting: Cardiology

## 2013-05-13 NOTE — Telephone Encounter (Signed)
New Problem:    Called in wanting to know the status of faxes that were sent between 5/15 and 6/9.  Please call back.

## 2013-05-13 NOTE — Telephone Encounter (Signed)
**Note De-Identified Quantavia Frith Obfuscation** This is an [place] pt. Will forward note to Carlye Grippe, LPN.

## 2013-05-15 NOTE — Telephone Encounter (Signed)
Left message via voicemail that paperwork faxed 03/11/13.

## 2013-05-22 ENCOUNTER — Encounter: Payer: Self-pay | Admitting: Physician Assistant

## 2013-05-22 ENCOUNTER — Ambulatory Visit (INDEPENDENT_AMBULATORY_CARE_PROVIDER_SITE_OTHER): Payer: PRIVATE HEALTH INSURANCE | Admitting: Physician Assistant

## 2013-05-22 VITALS — BP 102/66 | HR 52 | Ht 71.0 in | Wt 244.0 lb

## 2013-05-22 DIAGNOSIS — I472 Ventricular tachycardia, unspecified: Secondary | ICD-10-CM

## 2013-05-22 DIAGNOSIS — I5022 Chronic systolic (congestive) heart failure: Secondary | ICD-10-CM

## 2013-05-22 DIAGNOSIS — I429 Cardiomyopathy, unspecified: Secondary | ICD-10-CM

## 2013-05-22 DIAGNOSIS — R0789 Other chest pain: Secondary | ICD-10-CM

## 2013-05-22 DIAGNOSIS — I498 Other specified cardiac arrhythmias: Secondary | ICD-10-CM

## 2013-05-22 DIAGNOSIS — R001 Bradycardia, unspecified: Secondary | ICD-10-CM

## 2013-05-22 DIAGNOSIS — I509 Heart failure, unspecified: Secondary | ICD-10-CM

## 2013-05-22 DIAGNOSIS — I428 Other cardiomyopathies: Secondary | ICD-10-CM

## 2013-05-22 NOTE — Assessment & Plan Note (Signed)
Patient appears euvolemic by history and exam. Continue current dose diuretic. Check followup labs, including Mg level.

## 2013-05-22 NOTE — Patient Instructions (Addendum)
Your physician recommends that you schedule a follow-up appointment in: 4 months. You will receive a reminder letter in the mail in about 1-2 months reminding you to call and schedule your appointment. If you don't receive this letter, please contact our office. Your physician recommends that you continue on your current medications as directed. Please refer to the Current Medication list given to you today. Your physician recommends that you return for lab work today at [facility] for BMET and Magnesium level.

## 2013-05-22 NOTE — Progress Notes (Signed)
Primary Cardiologist: Jerral Bonito, MD   HPI: Post hospital followup from [facility], status post recent presentation with atypical CP. Troponins NL. No further ischemic evaluation recommended. Of note, patient did develop asymptomatic NSVT, for which he was initially taken off sotalol due to prolonged QT interval. Per our subsequent review, however, we did not agree with computerized interpretation; therefore, we resumed Sotalol at prior dose of 120 twice a day. We did, however, agree with decrease in carvedilol dose, secondary to SB in the 40-50 bpm range. We also recommended patient be discharged on Mag-Ox and increased dose of potassium supplement.  He presents today reporting no recurrent CP. He also denies tachycardia palpitations, dizziness, presyncope, or ICD discharge.   Allergies  Allergen Reactions  . Atorvastatin     headaches    Current Outpatient Prescriptions  Medication Sig Dispense Refill  . aspirin EC 81 MG tablet Take 81 mg by mouth daily.      . benazepril (LOTENSIN) 5 MG tablet Take 1 tablet (5 mg total) by mouth daily.  90 tablet  3  . carvedilol (COREG) 6.25 MG tablet Take 3.125 mg by mouth 2 (two) times daily.      . furosemide (LASIX) 40 MG tablet Take 1 tablet (40 mg total) by mouth daily.  30 tablet  6  . magnesium oxide (MAG-OX) 400 MG tablet Take 400 mg by mouth daily.      Marland Kitchen omeprazole (PRILOSEC) 20 MG capsule Take 1 capsule (20 mg total) by mouth 2 (two) times daily.  60 capsule  6  . potassium chloride SA (K-DUR,KLOR-CON) 20 MEQ tablet Take 20 mEq by mouth daily.      . sotalol (BETAPACE) 120 MG tablet Take 1 tablet (120 mg total) by mouth every 12 (twelve) hours.  180 tablet  0   No current facility-administered medications for this visit.    Past Medical History  Diagnosis Date  . Mitral regurgitation   . NICM (nonischemic cardiomyopathy)     Nonischemic / catheterization October, 2011, normal coronary arteries  . Chronic systolic heart failure   . Ejection  fraction < 50%     Echo 06/05/12: Mild LVH, EF 20%, posterior severe HK, anterolateral severe HK, anterior severe HK, mid to apical septal AK, AK of the true apex, apical inferior HK, restrictive physiology, trivial MR, moderate LAE, mildly reduced RV function, mild TR.  . ICD (implantable cardiac defibrillator) battery depletion 11/11/10    St. Jude, Dr.Allred  . Renal insufficiency     Creatinine 1.5 (GFR greater than 50)  . Headache(784.0)     Patient seen to have headaches from spironolactone.  Drug was stopped, August, 2012  . Sleepiness     has some sleepiness during the day  . 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 VT (ventricular tachycardia)     VT storm 7/13 - Sotalol started  . Drug therapy     Sotalol started for VT storm  July, 2013  . Hypokalemia     There was some decrease in potassium with his admission in July, 2013. We will watch this very carefully.  . Disability examination     Discussion of disability September, 2013    Past Surgical History  Procedure Laterality Date  . Tee with cardioversion  11/17/08    No LV clot  . Cardiac defibrillator placement  11/11/10    SJM Fortify DR ICD implanted by Dr Johney Frame    History   Social History  . Marital Status:  Single    Spouse Name: N/A    Number of Children: 5  . Years of Education: N/A   Occupational History  . [employer]     Full time   Social History Main Topics  . Smoking status: Never Smoker   . Smokeless tobacco: Never Used  . Alcohol Use: No     Comment: OCCASIONAL  (NOT HEAVY)  . Drug Use: Not on file  . Sexually Active: Not on file   Other Topics Concern  . Not on file   Social History Narrative   Lives in [place], [place] with fiance.   No regular exercise.    Family History  Problem Relation Age of Onset  . Heart attack Father 48    Multiple MIs  . Heart attack Brother   . Heart failure Brother     CHF  . Hypertension Mother   . Heart attack Paternal Grandfather     ROS: no  nausea, vomiting; no fever, chills; no melena, hematochezia; no claudication  PHYSICAL EXAM: BP 102/66  Pulse 52  Ht 5\' 11"  (1.803 m)  Wt 244 lb (110.678 kg)  BMI 34.05 kg/m2  SpO2 98% GENERAL: 47 year-old male; NAD HEENT: NCAT, PERRLA, EOMI; sclera clear; no xanthelasma NECK: palpable bilateral carotid pulses, no bruits; no JVD; no TM LUNGS: CTA bilaterally CARDIAC: RRR (S1, S2); no significant murmurs; no rubs or gallops ABDOMEN: soft, non-tender; intact BS EXTREMETIES: no significant peripheral edema SKIN: warm/dry; no obvious rash/lesions MUSCULOSKELETAL: no joint deformity NEURO: no focal deficit; NL affect   EKG:    ASSESSMENT & PLAN:  Chronic systolic CHF (congestive heart failure) Patient appears euvolemic by history and exam. Continue current dose diuretic. Check followup labs, including Mg level.  Chest discomfort Recent presentation with atypical symptoms. Troponins NL. We suspected probable GI etiology, with recommendation for increased PPI dose. Patient has not had any recurrent symptoms.  Ventricular tachyarrhythmia Asymptomatic, nonsustained VT during recent hospitalization. QT interval remained stable. Patient was discharged on same dose of sotalol 120 twice a day. He denies any tachypalpitations or ICD discharge. Will check followup labs, including Mg level. He is to followup with Dr. Johney Frame, as previously scheduled.  Bradycardia Stable on same dose sotalol, and recently decreased dose of carvedilol.    Gene Brianna Bennett, PAC

## 2013-05-22 NOTE — Assessment & Plan Note (Signed)
Stable on same dose sotalol, and recently decreased dose of carvedilol.

## 2013-05-22 NOTE — Assessment & Plan Note (Signed)
Asymptomatic, nonsustained VT during recent hospitalization. QT interval remained stable. Patient was discharged on same dose of sotalol 120 twice a day. He denies any tachypalpitations or ICD discharge. Will check followup labs, including Mg level. He is to followup with Dr. Johney Frame, as previously scheduled.

## 2013-05-22 NOTE — Assessment & Plan Note (Signed)
Recent presentation with atypical symptoms. Troponins NL. We suspected probable GI etiology, with recommendation for increased PPI dose. Patient has not had any recurrent symptoms.

## 2013-05-24 ENCOUNTER — Telehealth: Payer: Self-pay | Admitting: *Deleted

## 2013-05-24 NOTE — Telephone Encounter (Signed)
Notes Recorded by Lesle Chris, LPN on 1/61/0960 at 4:04 PM Patient notified and verbalized understanding.   Notes Recorded by Lesle Chris, LPN on 4/54/0981 at 9:55 AM Left message to return call.

## 2013-05-24 NOTE — Telephone Encounter (Signed)
Message copied by Lesle Chris on Fri May 24, 2013  4:04 PM ------      Message from: Prescott Parma C      Created: Thu May 23, 2013 12:13 PM       NL K and Mg. Given recent h/o NSVT, I recommend he continue on supplemental MagOx, at current dose of 400 daily. ------

## 2013-06-12 ENCOUNTER — Ambulatory Visit: Payer: PRIVATE HEALTH INSURANCE | Admitting: Cardiology

## 2013-06-26 ENCOUNTER — Ambulatory Visit: Payer: PRIVATE HEALTH INSURANCE | Admitting: Cardiology

## 2013-07-10 ENCOUNTER — Ambulatory Visit (INDEPENDENT_AMBULATORY_CARE_PROVIDER_SITE_OTHER): Payer: Self-pay | Admitting: *Deleted

## 2013-07-10 DIAGNOSIS — I509 Heart failure, unspecified: Secondary | ICD-10-CM

## 2013-07-10 DIAGNOSIS — I428 Other cardiomyopathies: Secondary | ICD-10-CM

## 2013-07-10 DIAGNOSIS — I5022 Chronic systolic (congestive) heart failure: Secondary | ICD-10-CM

## 2013-07-10 DIAGNOSIS — I498 Other specified cardiac arrhythmias: Secondary | ICD-10-CM

## 2013-07-10 DIAGNOSIS — I429 Cardiomyopathy, unspecified: Secondary | ICD-10-CM

## 2013-07-10 DIAGNOSIS — R001 Bradycardia, unspecified: Secondary | ICD-10-CM

## 2013-07-10 LAB — ICD DEVICE OBSERVATION
AL AMPLITUDE: 3.4 mv
AL IMPEDENCE ICD: 375 Ohm
CHARGE TIME: 9 s
HV IMPEDENCE: 65 Ohm
MODE SWITCH EPISODES: 0
RV LEAD IMPEDENCE ICD: 462.5 Ohm
TOT-0006: 20120105000000
TOT-0007: 1
TOT-0008: 0
TOT-0009: 1
TOT-0010: 40
TZAT-0001SLOWVT: 1
TZAT-0018SLOWVT: NEGATIVE
TZAT-0019SLOWVT: 7.5 V
TZAT-0020SLOWVT: 1 ms
TZON-0003SLOWVT: 335 ms
TZON-0004SLOWVT: 30
TZON-0005SLOWVT: 6
TZON-0010SLOWVT: 40 ms
TZST-0001SLOWVT: 3
TZST-0001SLOWVT: 4
TZST-0003SLOWVT: 36 J
VF: 0

## 2013-07-10 NOTE — Progress Notes (Signed)
icd check in clinic. Normal device function. Battery longevity 6.6 to 7.3 years. No episodes recorded. ROV in 3 mths w/device clinic in [place].

## 2013-07-12 ENCOUNTER — Encounter: Payer: Self-pay | Admitting: Cardiology

## 2013-07-15 ENCOUNTER — Ambulatory Visit (INDEPENDENT_AMBULATORY_CARE_PROVIDER_SITE_OTHER): Payer: Self-pay | Admitting: Cardiology

## 2013-07-15 ENCOUNTER — Encounter: Payer: Self-pay | Admitting: Cardiology

## 2013-07-15 VITALS — BP 122/84 | HR 52 | Ht 71.0 in | Wt 245.1 lb

## 2013-07-15 DIAGNOSIS — Z0271 Encounter for disability determination: Secondary | ICD-10-CM

## 2013-07-15 DIAGNOSIS — I509 Heart failure, unspecified: Secondary | ICD-10-CM

## 2013-07-15 DIAGNOSIS — I498 Other specified cardiac arrhythmias: Secondary | ICD-10-CM

## 2013-07-15 DIAGNOSIS — E876 Hypokalemia: Secondary | ICD-10-CM

## 2013-07-15 DIAGNOSIS — I429 Cardiomyopathy, unspecified: Secondary | ICD-10-CM

## 2013-07-15 DIAGNOSIS — I472 Ventricular tachycardia: Secondary | ICD-10-CM

## 2013-07-15 DIAGNOSIS — I5022 Chronic systolic (congestive) heart failure: Secondary | ICD-10-CM

## 2013-07-15 DIAGNOSIS — I428 Other cardiomyopathies: Secondary | ICD-10-CM

## 2013-07-15 DIAGNOSIS — R001 Bradycardia, unspecified: Secondary | ICD-10-CM

## 2013-07-15 MED ORDER — POTASSIUM CHLORIDE 20 MEQ/15ML (10%) PO LIQD: 20.0000 meq | Freq: Every day | ORAL | Status: DC

## 2013-07-15 NOTE — Progress Notes (Signed)
HPI   Patient is here to followup cardiomyopathy. He was hospitalized prior to his July visit in the [place] office. At that time he was stable. When he was off his sotalol briefly in the hospital, he had some nonsustained ventricular tachycardia. His rhythm stabilized as soon as his sotalol was restarted. He's not had any syncope or presyncope.  Unfortunately his disability continues to be denied. In my opinion this is very very unfortunate. My records have shown that this gentleman is unquestionably disabled.  He's been having some problems feeling uncomfortable after he swallows his large potassium pill. He is able to swallow it but then he begins to feel poorly.  Allergies  Allergen Reactions  . Atorvastatin     headaches    Current Outpatient Prescriptions  Medication Sig Dispense Refill  . aspirin EC 81 MG tablet Take 81 mg by mouth daily.      . benazepril (LOTENSIN) 5 MG tablet Take 1 tablet (5 mg total) by mouth daily.  90 tablet  3  . carvedilol (COREG) 6.25 MG tablet Take 3.125 mg by mouth 2 (two) times daily.      . furosemide (LASIX) 40 MG tablet Take 1 tablet (40 mg total) by mouth daily.  30 tablet  6  . magnesium oxide (MAG-OX) 400 MG tablet Take 400 mg by mouth daily.      Marland Kitchen omeprazole (PRILOSEC) 20 MG capsule Take 1 capsule (20 mg total) by mouth 2 (two) times daily.  60 capsule  6  . potassium chloride SA (K-DUR,KLOR-CON) 20 MEQ tablet Take 20 mEq by mouth daily.      . sotalol (BETAPACE) 120 MG tablet Take 1 tablet (120 mg total) by mouth every 12 (twelve) hours.  180 tablet  0   No current facility-administered medications for this visit.    History   Social History  . Marital Status: Single    Spouse Name: N/A    Number of Children: 5  . Years of Education: N/A   Occupational History  . [employer]     Full time   Social History Main Topics  . Smoking status: Never Smoker   . Smokeless tobacco: Never Used  . Alcohol Use: No     Comment: OCCASIONAL  (NOT  HEAVY)  . Drug Use: Not on file  . Sexual Activity: Not on file   Other Topics Concern  . Not on file   Social History Narrative   Lives in [place], [place] with fiance.   No regular exercise.    Family History  Problem Relation Age of Onset  . Heart attack Father 61    Multiple MIs  . Heart attack Brother   . Heart failure Brother     CHF  . Hypertension Mother   . Heart attack Paternal Grandfather     Past Medical History  Diagnosis Date  . Mitral regurgitation   . NICM (nonischemic cardiomyopathy)     Nonischemic / catheterization October, 2011, normal coronary arteries  . Chronic systolic heart failure   . Ejection fraction < 50%     Echo 06/05/12: Mild LVH, EF 20%, posterior severe HK, anterolateral severe HK, anterior severe HK, mid to apical septal AK, AK of the true apex, apical inferior HK, restrictive physiology, trivial MR, moderate LAE, mildly reduced RV function, mild TR.  . ICD (implantable cardiac defibrillator) battery depletion 11/11/10    St. Jude, Dr.Allred  . Renal insufficiency     Creatinine 1.5 (GFR greater than 50)  .  Headache(784.0)     Patient seen to have headaches from spironolactone.  Drug was stopped, August, 2012  . Sleepiness     has some sleepiness during the day  . 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 VT (ventricular tachycardia)     VT storm 7/13 - Sotalol started  . Drug therapy     Sotalol started for VT storm  July, 2013  . Hypokalemia     There was some decrease in potassium with his admission in July, 2013. We will watch this very carefully.  . Disability examination     Discussion of disability September, 2013    Past Surgical History  Procedure Laterality Date  . Tee with cardioversion  11/17/08    No LV clot  . Cardiac defibrillator placement  11/11/10    SJM Fortify DR ICD implanted by Dr Johney Frame    Patient Active Problem List   Diagnosis Date Noted  . Chronic systolic CHF (congestive heart failure) 04/26/2013    . Chest discomfort 04/26/2013  . Bradycardia 04/26/2013  . ICD-St.Jude 09/04/2012  . Hypokalemia   . Disability examination   . Drug therapy   . Ventricular tachyarrhythmia 06/06/2012  . Headache   . Sleepiness   . Mitral regurgitation   . Cardiomyopathy   . Ejection fraction < 50%   . Renal insufficiency     ROS   Patient denies fever, chills, headache, sweats, rash, change in vision, change in hearing, chest pain, cough, nausea vomiting, urinary symptoms. All other systems are reviewed and are negative.  PHYSICAL EXAM  Patient is oriented to person time and place. Affect is normal. He's here with his wife. There is no jugulovenous distention. Lungs are clear. Respiratory effort is nonlabored. Cardiac exam reveals S1 and S2. There no clicks or significant murmurs. The abdomen is soft. There is no peripheral edema.  Filed Vitals:   07/15/13 0914  BP: 122/84  Pulse: 52  Height: 5\' 11"  (1.803 m)  Weight: 245 lb 1.9 oz (111.186 kg)  SpO2: 98%     ASSESSMENT & PLAN

## 2013-07-15 NOTE — Assessment & Plan Note (Signed)
I cannot understand why this gentleman is being denied for disability. It is extremely well documented that he has severe cardiomyopathy. In addition he has had ventricular tachycardia storm.

## 2013-07-15 NOTE — Assessment & Plan Note (Signed)
The patient's resting rate is slow. We cannot push his carvedilol dose any higher.

## 2013-07-15 NOTE — Patient Instructions (Addendum)
Your physician recommends that you schedule a follow-up appointment in: 3 months    Your physician has recommended you make the following change in your medication:   kdur has been changed to a liquid.

## 2013-07-15 NOTE — Assessment & Plan Note (Addendum)
It is very important that we keep his potassium up. It was normal on the most recent lab. We will try a liquid form of potassium.

## 2013-07-15 NOTE — Assessment & Plan Note (Signed)
He has not had any recurrent clinical ventricular tachycardia.

## 2013-07-15 NOTE — Assessment & Plan Note (Signed)
Currently his systolic heart failure is under control. No change in therapy. He is having difficulty swallowing large potassium pill. We will try a liquid format

## 2013-07-15 NOTE — Assessment & Plan Note (Addendum)
The patient is on the appropriate medications. He does not tolerate spironolactone.

## 2013-07-24 ENCOUNTER — Encounter: Payer: Self-pay | Admitting: Internal Medicine

## 2013-08-01 ENCOUNTER — Telehealth: Payer: Self-pay | Admitting: Cardiology

## 2013-08-01 ENCOUNTER — Telehealth: Payer: Self-pay | Admitting: *Deleted

## 2013-08-01 NOTE — Telephone Encounter (Signed)
New Problem  Sue Lush w/ [employer] is calling to get a complete set office visit notes requested. And the form for the provider to sign.. Will fax over another request and form.

## 2013-08-01 NOTE — Telephone Encounter (Signed)
Sue Lush, from [employer], calling today about [employer] papers to be signed by Dr. Myrtis Ser. I have looked for papers & talked with Selena Batten in medical records. We do not have anything from [employer]. Redirected her to contact [employer] about paperwork  Mylo Red RN

## 2013-08-01 NOTE — Telephone Encounter (Signed)
Left andrea from [employer] a message to call back.

## 2013-08-02 NOTE — Telephone Encounter (Signed)
I spoke with Marcelino Duster at [facility] who states that she faxed this paperwork back to [place] on 9/23. I called and left detailed message on Andrea's, with [employer], VM stating that this paperwork was fax to them on 9/23 and to call me back if she has not received it.

## 2013-09-02 ENCOUNTER — Other Ambulatory Visit: Payer: Self-pay | Admitting: Cardiology

## 2013-09-02 NOTE — Telephone Encounter (Signed)
PLEASE SEND RX TO [facility]

## 2013-09-03 ENCOUNTER — Telehealth: Payer: Self-pay | Admitting: Cardiology

## 2013-09-03 MED ORDER — POTASSIUM CHLORIDE ER 10 MEQ PO TBCR: 20.0000 meq | EXTENDED_RELEASE_TABLET | Freq: Every day | ORAL | Status: DC

## 2013-09-03 NOTE — Telephone Encounter (Signed)
Pt called and stated that the liquid form of his potassium was to expensive and he wanted to go back to the tablets. Called pharmacy to get price on Klor - Con 10 MEQ 2 tablets twice daily pharmacist said it would be around $25 for a one month supply. Called pt to see if he was ok with this price and then sent prescription to pharmacy for a qty of 60 with 6 refills of the 10 MEQ klor con tablets.

## 2013-09-09 ENCOUNTER — Telehealth: Payer: Self-pay | Admitting: Cardiology

## 2013-09-09 MED ORDER — POTASSIUM CHLORIDE CRYS ER 20 MEQ PO TBCR: 20.0000 meq | EXTENDED_RELEASE_TABLET | Freq: Every day | ORAL | Status: DC

## 2013-09-09 NOTE — Telephone Encounter (Signed)
Patient wanting to know if he can take Potassium over the counter instead of the liquid form.

## 2013-09-09 NOTE — Telephone Encounter (Signed)
Patient called and informed that the liquid K+ is the most expensive and he should try the tablets again. Nurse advised patient that OTC K+ is different and wouldn't give him the amount he needed. Nurse advised patient of the cheapest pharmacy to get K+ which is [facility] and patient agreed to retry the tablets, dissolving it in warm water and mixing it with food.

## 2013-09-10 ENCOUNTER — Other Ambulatory Visit: Payer: Self-pay | Admitting: Cardiology

## 2013-09-10 MED ORDER — SOTALOL HCL 120 MG PO TABS: 120.0000 mg | ORAL_TABLET | Freq: Two times a day (BID) | ORAL | Status: DC

## 2013-09-10 MED ORDER — BENAZEPRIL HCL 5 MG PO TABS: 5.0000 mg | ORAL_TABLET | Freq: Every day | ORAL | Status: DC

## 2013-09-10 MED ORDER — FUROSEMIDE 40 MG PO TABS: 40.0000 mg | ORAL_TABLET | Freq: Every day | ORAL | Status: DC

## 2013-09-10 MED ORDER — CARVEDILOL 6.25 MG PO TABS: 3.1250 mg | ORAL_TABLET | Freq: Two times a day (BID) | ORAL | Status: DC

## 2013-10-15 ENCOUNTER — Telehealth: Payer: Self-pay | Admitting: *Deleted

## 2013-10-15 NOTE — Telephone Encounter (Signed)
Patient came into office with letter in hand from [facility] Hearings and Appeals Section requesting records. Patient stated that he was instructed by his advocate to obtain a letter from cardiologist with specific reasons as to why he was disable. Patient said the letter needed to state specific restrictions. Nurse advised patient that MD would be informed. Spoke with MD and was informed that we should give patient copies of records that are readily available and unless the disability office requested specifically what they needed in writing, that previous records should be sufficient moving forward. Patient informed and verbalized understanding of plan.

## 2013-10-18 ENCOUNTER — Ambulatory Visit (INDEPENDENT_AMBULATORY_CARE_PROVIDER_SITE_OTHER): Payer: Disability Insurance | Admitting: *Deleted

## 2013-10-18 DIAGNOSIS — I428 Other cardiomyopathies: Secondary | ICD-10-CM

## 2013-10-18 DIAGNOSIS — I5022 Chronic systolic (congestive) heart failure: Secondary | ICD-10-CM

## 2013-10-18 DIAGNOSIS — I472 Ventricular tachycardia, unspecified: Secondary | ICD-10-CM

## 2013-10-18 DIAGNOSIS — I498 Other specified cardiac arrhythmias: Secondary | ICD-10-CM

## 2013-10-18 DIAGNOSIS — I509 Heart failure, unspecified: Secondary | ICD-10-CM

## 2013-10-18 DIAGNOSIS — I429 Cardiomyopathy, unspecified: Secondary | ICD-10-CM

## 2013-10-18 DIAGNOSIS — R001 Bradycardia, unspecified: Secondary | ICD-10-CM

## 2013-10-18 LAB — MDC_IDC_ENUM_SESS_TYPE_INCLINIC
Battery Remaining Longevity: 84 mo
Date Time Interrogation Session: 20141212155002
HighPow Impedance: 67.5 Ohm
Implantable Pulse Generator Serial Number: 622431
Lead Channel Impedance Value: 437.5 Ohm
Lead Channel Pacing Threshold Amplitude: 0.75 V
Lead Channel Pacing Threshold Amplitude: 0.75 V
Lead Channel Pacing Threshold Pulse Width: 0.4 ms
Lead Channel Sensing Intrinsic Amplitude: 11.2 mV
Lead Channel Setting Pacing Amplitude: 2.5 V
Lead Channel Setting Sensing Sensitivity: 0.5 mV
Zone Setting Detection Interval: 270 ms

## 2013-10-18 NOTE — Progress Notes (Signed)
ICD check in clinic. Battery longevity 6.3 to 7.0 years. No episodes since last check. Corvue increase 08-21-13 x 10 days. Pt instructed to hook up transmitter with cell adapter. Pt preferred to make OV appt rather than do remote check. Will revisit transmitter at next visit. ROV 01-31-14 @ 0900 in [place] office.

## 2013-10-29 ENCOUNTER — Ambulatory Visit (INDEPENDENT_AMBULATORY_CARE_PROVIDER_SITE_OTHER): Payer: Disability Insurance | Admitting: Cardiology

## 2013-10-29 ENCOUNTER — Encounter: Payer: Self-pay | Admitting: Cardiology

## 2013-10-29 VITALS — BP 109/63 | HR 54 | Ht 71.0 in | Wt 252.0 lb

## 2013-10-29 DIAGNOSIS — I428 Other cardiomyopathies: Secondary | ICD-10-CM

## 2013-10-29 DIAGNOSIS — I472 Ventricular tachycardia: Secondary | ICD-10-CM

## 2013-10-29 DIAGNOSIS — I5022 Chronic systolic (congestive) heart failure: Secondary | ICD-10-CM

## 2013-10-29 DIAGNOSIS — R9430 Abnormal result of cardiovascular function study, unspecified: Secondary | ICD-10-CM

## 2013-10-29 DIAGNOSIS — I509 Heart failure, unspecified: Secondary | ICD-10-CM

## 2013-10-29 DIAGNOSIS — I340 Nonrheumatic mitral (valve) insufficiency: Secondary | ICD-10-CM

## 2013-10-29 DIAGNOSIS — Z0271 Encounter for disability determination: Secondary | ICD-10-CM

## 2013-10-29 DIAGNOSIS — I059 Rheumatic mitral valve disease, unspecified: Secondary | ICD-10-CM

## 2013-10-29 DIAGNOSIS — R0989 Other specified symptoms and signs involving the circulatory and respiratory systems: Secondary | ICD-10-CM

## 2013-10-29 NOTE — Patient Instructions (Signed)
Your physician recommends that you schedule a follow-up appointment in: 3 months. Your physician recommends that you continue on your current medications as directed. Please refer to the Current Medication list given to you today. 

## 2013-10-29 NOTE — Progress Notes (Signed)
HPI  The patient is seen today to followup his cardiomyopathy and history of life-threatening ventricular tachycardia. He continues on sotalol. He has not had a recent episode of ventricular tachycardia. I have mentioned in my notes previously that I am very surprised that his disability has been turned down. He continues to look into this. This is appropriate. This young man is not able to return to work. He has physical limitations that are related to congestive heart failure. In addition he has life-threatening ventricular arrhythmias. It is possible that one of these arrhythmias could be set off by the stress at work. If that were to occur, it could put him and his fellow employees at risk.  He is not having any chest pain at this time.  Allergies  Allergen Reactions  . Atorvastatin     headaches    Current Outpatient Prescriptions  Medication Sig Dispense Refill  . aspirin EC 81 MG tablet Take 81 mg by mouth daily.      . benazepril (LOTENSIN) 5 MG tablet Take 1 tablet (5 mg total) by mouth daily.  30 tablet  3  . carvedilol (COREG) 6.25 MG tablet Take 0.5 tablets (3.125 mg total) by mouth 2 (two) times daily.  30 tablet  6  . furosemide (LASIX) 40 MG tablet Take 1 tablet (40 mg total) by mouth daily.  30 tablet  6  . magnesium oxide (MAG-OX) 400 MG tablet Take 400 mg by mouth daily.      Marland Kitchen omeprazole (PRILOSEC) 20 MG capsule Take 1 capsule (20 mg total) by mouth 2 (two) times daily.  60 capsule  6  . potassium chloride SA (KLOR-CON M20) 20 MEQ tablet Take 1 tablet (20 mEq total) by mouth daily.  30 tablet  6  . sotalol (BETAPACE) 120 MG tablet Take 1 tablet (120 mg total) by mouth every 12 (twelve) hours.  60 tablet  6   No current facility-administered medications for this visit.    History   Social History  . Marital Status: Single    Spouse Name: N/A    Number of Children: 5  . Years of Education: N/A   Occupational History  . [employer]     Full time   Social History  Main Topics  . Smoking status: Never Smoker   . Smokeless tobacco: Never Used  . Alcohol Use: No     Comment: OCCASIONAL  (NOT HEAVY)  . Drug Use: Not on file  . Sexual Activity: Not on file   Other Topics Concern  . Not on file   Social History Narrative   Lives in [place], [place] with fiance.   No regular exercise.    Family History  Problem Relation Age of Onset  . Heart attack Father 50    Multiple MIs  . Heart attack Brother   . Heart failure Brother     CHF  . Hypertension Mother   . Heart attack Paternal Grandfather     Past Medical History  Diagnosis Date  . Mitral regurgitation   . NICM (nonischemic cardiomyopathy)     Nonischemic / catheterization October, 2011, normal coronary arteries  . Chronic systolic heart failure   . Ejection fraction < 50%     Echo 06/05/12: Mild LVH, EF 20%, posterior severe HK, anterolateral severe HK, anterior severe HK, mid to apical septal AK, AK of the true apex, apical inferior HK, restrictive physiology, trivial MR, moderate LAE, mildly reduced RV function, mild TR.  . ICD (  implantable cardiac defibrillator) battery depletion 11/11/10    St. Jude, Dr.Allred  . Renal insufficiency     Creatinine 1.5 (GFR greater than 50)  . Headache(784.0)     Patient seen to have headaches from spironolactone.  Drug was stopped, August, 2012  . Sleepiness     has some sleepiness during the day  . 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 VT (ventricular tachycardia)     VT storm 7/13 - Sotalol started  . Drug therapy     Sotalol started for VT storm  July, 2013  . Hypokalemia     There was some decrease in potassium with his admission in July, 2013. We will watch this very carefully.  . Disability examination     Discussion of disability September, 2013    Past Surgical History  Procedure Laterality Date  . Tee with cardioversion  11/17/08    No LV clot  . Cardiac defibrillator placement  11/11/10    SJM Fortify DR ICD implanted by Dr  Johney Frame    Patient Active Problem List   Diagnosis Date Noted  . Chronic systolic CHF (congestive heart failure) 04/26/2013  . Chest discomfort 04/26/2013  . Bradycardia 04/26/2013  . ICD-St.Jude 09/04/2012  . Hypokalemia   . Disability examination   . Drug therapy   . Ventricular tachyarrhythmia 06/06/2012  . Headache   . Sleepiness   . Mitral regurgitation   . Cardiomyopathy   . Ejection fraction < 50%   . Renal insufficiency     ROS   Patient denies fever, chills, headache, sweats, rash, change in vision, change in hearing, chest pain, cough, nausea vomiting, urinary symptoms. All other systems are reviewed and are negative.  PHYSICAL EXAM  Patient is oriented to person time and place. Affect is normal. He is here with his wife. There is no jugulovenous distention. Lungs are clear. Respiratory effort is nonlabored. Cardiac exam reveals S1 and S2. There no clicks or significant murmurs. The abdomen is soft. There is no peripheral edema. There are no musculoskeletal deformities. There are no skin rashes.  Filed Vitals:   10/29/13 1040  BP: 109/63  Pulse: 54  Height: 5\' 11"  (1.803 m)  Weight: 252 lb (114.306 kg)     ASSESSMENT & PLAN

## 2013-10-29 NOTE — Assessment & Plan Note (Signed)
The patient had documented ventricular tachycardia storm in July, 2013. This is a very dangerous situation. Sotalol was started. Fortunately, he has tolerated this medication and his QT interval has been stable.

## 2013-10-29 NOTE — Assessment & Plan Note (Signed)
The patient's ejection fraction is in the range of 30-35%. We know this from his last echo of March, 2014.

## 2013-10-29 NOTE — Assessment & Plan Note (Signed)
The patient had mild to moderate mitral regurgitation in the past. I will plan to do a followup echo over the next 6 months.

## 2013-10-29 NOTE — Assessment & Plan Note (Signed)
He is very careful with his salt and fluid intake. His volume status currently is stable. No change in therapy.

## 2013-10-29 NOTE — Assessment & Plan Note (Addendum)
Catheterization in the past revealed no significant coronary disease. I have very carefully titrated his medications to the optimal levels. No change in his medications are recommended at this time.  As part of today's evaluation I spent greater than 25 minutes with his total care. More than half of this time was with direct discussions with him concerning his overall care.

## 2013-10-29 NOTE — Assessment & Plan Note (Signed)
His ICD is in place and and is monitored carefully.

## 2013-10-29 NOTE — Assessment & Plan Note (Signed)
Over time I have provided every piece of information that has been requested. I have made it clear in my records that this patient is completely disabled. He has physical limitations. Any type of physical work could possibly lead to development of his ventricular arrhythmias. In addition any significant work stress could set off his ventricular arrhythmias. This could be dangerous for the patient or his fellow employees.  The patient is completely and totally disabled.

## 2013-12-16 ENCOUNTER — Telehealth: Payer: Self-pay | Admitting: Cardiology

## 2013-12-16 NOTE — Telephone Encounter (Signed)
Adam [name] called stating that he is running short on his CARVEDILOL 6.25 MG PO TABS  each month.   Please advise.

## 2013-12-17 MED ORDER — CARVEDILOL 6.25 MG PO TABS: 6.2500 mg | ORAL_TABLET | Freq: Two times a day (BID) | ORAL | Status: DC

## 2013-12-17 NOTE — Telephone Encounter (Signed)
Spoke with patient and he said that he has been taking carvedilol 6.25 mg twice a day instead of 3.125 mg twice daily. Nurse informed patient that medications reviewed in office at last several visits stated 3.125 mg twice daily. Patient informed nurse that he got confused due to history of taking half tablets. Nurse called and informed Dr. Myrtis Ser, per Dr. Myrtis Ser its okay to call in carvedilol 6.25 mg twice daily. Patient informed.

## 2014-01-24 ENCOUNTER — Encounter (INDEPENDENT_AMBULATORY_CARE_PROVIDER_SITE_OTHER): Payer: Self-pay

## 2014-01-24 ENCOUNTER — Encounter: Payer: Self-pay | Admitting: Cardiology

## 2014-01-24 ENCOUNTER — Ambulatory Visit (INDEPENDENT_AMBULATORY_CARE_PROVIDER_SITE_OTHER): Payer: Disability Insurance | Admitting: Cardiology

## 2014-01-24 VITALS — BP 124/82 | HR 55 | Ht 71.0 in | Wt 258.8 lb

## 2014-01-24 DIAGNOSIS — I5022 Chronic systolic (congestive) heart failure: Secondary | ICD-10-CM

## 2014-01-24 DIAGNOSIS — Z95810 Presence of automatic (implantable) cardiac defibrillator: Secondary | ICD-10-CM

## 2014-01-24 DIAGNOSIS — I4729 Other ventricular tachycardia: Secondary | ICD-10-CM

## 2014-01-24 DIAGNOSIS — I472 Ventricular tachycardia, unspecified: Secondary | ICD-10-CM

## 2014-01-24 DIAGNOSIS — N289 Disorder of kidney and ureter, unspecified: Secondary | ICD-10-CM

## 2014-01-24 DIAGNOSIS — I428 Other cardiomyopathies: Secondary | ICD-10-CM

## 2014-01-24 DIAGNOSIS — I429 Cardiomyopathy, unspecified: Secondary | ICD-10-CM

## 2014-01-24 DIAGNOSIS — I509 Heart failure, unspecified: Secondary | ICD-10-CM

## 2014-01-24 DIAGNOSIS — I059 Rheumatic mitral valve disease, unspecified: Secondary | ICD-10-CM

## 2014-01-24 DIAGNOSIS — Z0271 Encounter for disability determination: Secondary | ICD-10-CM

## 2014-01-24 DIAGNOSIS — R42 Dizziness and giddiness: Secondary | ICD-10-CM

## 2014-01-24 DIAGNOSIS — I340 Nonrheumatic mitral (valve) insufficiency: Secondary | ICD-10-CM

## 2014-01-24 NOTE — Assessment & Plan Note (Signed)
His ICD is in place and followed carefully by the electrophysiology team. No further workup.

## 2014-01-24 NOTE — Assessment & Plan Note (Signed)
His volume status is stable. No change in therapy. 

## 2014-01-24 NOTE — Assessment & Plan Note (Signed)
In my notes over time I have outlined the fact that the patient is unquestionably disabled. He has severe left ventricular dysfunction. He has had ventricular tachycardia storm. I do not understand why he has not and given disability. He is working with a Clinical research associate but unfortunately this has not been successful over a long period of time.

## 2014-01-24 NOTE — Assessment & Plan Note (Signed)
He's had some mild lightheadedness. This is a nonspecific symptom for him at this time. It is appropriate to check his blood count and his chemistry and thyroid function. Otherwise am hesitant to change any of his medicines.

## 2014-01-24 NOTE — Progress Notes (Signed)
HPI  Patient is seen today to followup cardiomyopathy. He has not had any chest pain or shortness of breath. He's not had any evidence of ICD discharge. He's had some periods of lightheadedness while sitting at home. This does not affect his ability to be up and walking. He has not had syncope or presyncope.  Allergies  Allergen Reactions  . Atorvastatin     headaches    Current Outpatient Prescriptions  Medication Sig Dispense Refill  . aspirin EC 81 MG tablet Take 81 mg by mouth daily.      . benazepril (LOTENSIN) 5 MG tablet Take 1 tablet (5 mg total) by mouth daily.  30 tablet  3  . carvedilol (COREG) 6.25 MG tablet Take 1 tablet (6.25 mg total) by mouth 2 (two) times daily.  60 tablet  6  . furosemide (LASIX) 40 MG tablet Take 1 tablet (40 mg total) by mouth daily.  30 tablet  6  . magnesium oxide (MAG-OX) 400 MG tablet Take 400 mg by mouth daily.      Marland Kitchen omeprazole (PRILOSEC) 20 MG capsule Take 1 capsule (20 mg total) by mouth 2 (two) times daily.  60 capsule  6  . potassium chloride SA (KLOR-CON M20) 20 MEQ tablet Take 1 tablet (20 mEq total) by mouth daily.  30 tablet  6  . sotalol (BETAPACE) 120 MG tablet Take 1 tablet (120 mg total) by mouth every 12 (twelve) hours.  60 tablet  6   No current facility-administered medications for this visit.    History   Social History  . Marital Status: Single    Spouse Name: N/A    Number of Children: 5  . Years of Education: N/A   Occupational History  . [employer]     Full time   Social History Main Topics  . Smoking status: Never Smoker   . Smokeless tobacco: Never Used  . Alcohol Use: No     Comment: OCCASIONAL  (NOT HEAVY)  . Drug Use: Not on file  . Sexual Activity: Not on file   Other Topics Concern  . Not on file   Social History Narrative   Lives in [place], [place] with fiance.   No regular exercise.    Family History  Problem Relation Age of Onset  . Heart attack Father 93    Multiple MIs  . Heart attack Brother    . Heart failure Brother     CHF  . Hypertension Mother   . Heart attack Paternal Grandfather     Past Medical History  Diagnosis Date  . Mitral regurgitation   . NICM (nonischemic cardiomyopathy)     Nonischemic / catheterization October, 2011, normal coronary arteries  . Chronic systolic heart failure   . Ejection fraction < 50%     Echo 06/05/12: Mild LVH, EF 20%, posterior severe HK, anterolateral severe HK, anterior severe HK, mid to apical septal AK, AK of the true apex, apical inferior HK, restrictive physiology, trivial MR, moderate LAE, mildly reduced RV function, mild TR.  . ICD (implantable cardiac defibrillator) battery depletion 11/11/10    St. Jude, Dr.Allred  . Renal insufficiency     Creatinine 1.5 (GFR greater than 50)  . Headache(784.0)     Patient seen to have headaches from spironolactone.  Drug was stopped, August, 2012  . Sleepiness     has some sleepiness during the day  . 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 VT (ventricular tachycardia)  VT storm 7/13 - Sotalol started  . Drug therapy     Sotalol started for VT storm  July, 2013  . Hypokalemia     There was some decrease in potassium with his admission in July, 2013. We will watch this very carefully.  . Disability examination     Discussion of disability September, 2013    Past Surgical History  Procedure Laterality Date  . Tee with cardioversion  11/17/08    No LV clot  . Cardiac defibrillator placement  11/11/10    SJM Fortify DR ICD implanted by Dr Johney FrameAllred    Patient Active Problem List   Diagnosis Date Noted  . Nonischemic cardiomyopathy 10/29/2013  . Chronic systolic CHF (congestive heart failure) 04/26/2013  . Chest discomfort 04/26/2013  . Bradycardia 04/26/2013  . Automatic implantable cardioverter-defibrillator in situ 09/04/2012  . Hypokalemia   . Disability examination   . Drug therapy   . Ventricular tachyarrhythmia 06/06/2012  . Headache   . Sleepiness   . Mitral  regurgitation   . Cardiomyopathy   . Ejection fraction < 50%   . Renal insufficiency     ROS   Patient denies fever, chills, headache, sweats, rash, change in vision, change in hearing, chest pain, cough, nausea vomiting, urinary symptoms. All other systems are reviewed and are negative.  PHYSICAL EXAM  Patient is oriented to person time and place. Affect is normal. There is no jugulovenous distention. Lungs are clear. Respiratory effort is nonlabored. Cardiac exam reveals S1 and S2. There no clicks or significant murmurs. Abdomen is soft. There's no peripheral edema.  Filed Vitals:   01/24/14 0825  BP: 124/82  Pulse: 55  Height: 5\' 11"  (1.803 m)  Weight: 258 lb 12.8 oz (117.391 kg)  SpO2: 96%     ASSESSMENT & PLAN

## 2014-01-24 NOTE — Assessment & Plan Note (Signed)
Fortunately he has not had any recurrent ventricular tachycardia on sotalol. Chemistry will be checked to be sure that his potassium is normal.

## 2014-01-24 NOTE — Assessment & Plan Note (Signed)
There is a history of mild increased creatinine. He has not had labs since July, 2014. He is on diuretic. Chemistry level be checked today.

## 2014-01-24 NOTE — Patient Instructions (Addendum)
Your physician recommends that you schedule a follow-up appointment in: 6 months. You will receive a reminder letter in the mail in about 4 months reminding you to call and schedule your appointment. If you don't receive this letter, please contact our office. Your physician recommends that you continue on your current medications as directed. Please refer to the Current Medication list given to you today. Your physician recommends that you have lab work to check your BMET,CBC and TSH.

## 2014-01-24 NOTE — Assessment & Plan Note (Signed)
Patient is on the appropriate medications that he has been able to tolerate overtime. No change in therapy.

## 2014-01-24 NOTE — Assessment & Plan Note (Signed)
Mitral regurgitation was mild by echo March, 2014. He does not need an echo now.

## 2014-01-31 ENCOUNTER — Ambulatory Visit (INDEPENDENT_AMBULATORY_CARE_PROVIDER_SITE_OTHER): Payer: Disability Insurance | Admitting: *Deleted

## 2014-01-31 ENCOUNTER — Encounter: Payer: Self-pay | Admitting: Internal Medicine

## 2014-01-31 ENCOUNTER — Telehealth: Payer: Self-pay | Admitting: Internal Medicine

## 2014-01-31 DIAGNOSIS — I509 Heart failure, unspecified: Secondary | ICD-10-CM

## 2014-01-31 DIAGNOSIS — I5022 Chronic systolic (congestive) heart failure: Secondary | ICD-10-CM

## 2014-01-31 DIAGNOSIS — I429 Cardiomyopathy, unspecified: Secondary | ICD-10-CM

## 2014-01-31 DIAGNOSIS — I428 Other cardiomyopathies: Secondary | ICD-10-CM

## 2014-01-31 LAB — MDC_IDC_ENUM_SESS_TYPE_INCLINIC
Battery Remaining Longevity: 81.6 mo
Brady Statistic RV Percent Paced: 0.02 %
HighPow Impedance: 68.625
Implantable Pulse Generator Serial Number: 622431
Lead Channel Impedance Value: 375 Ohm
Lead Channel Impedance Value: 437.5 Ohm
Lead Channel Pacing Threshold Amplitude: 0.75 V
Lead Channel Pacing Threshold Pulse Width: 0.4 ms
Lead Channel Sensing Intrinsic Amplitude: 11.2 mV
Lead Channel Sensing Intrinsic Amplitude: 3.5 mV
Lead Channel Setting Pacing Pulse Width: 0.4 ms
Lead Channel Setting Sensing Sensitivity: 0.5 mV
MDC IDC MSMT LEADCHNL RV PACING THRESHOLD AMPLITUDE: 0.75 V
MDC IDC MSMT LEADCHNL RV PACING THRESHOLD PULSEWIDTH: 0.4 ms
MDC IDC SESS DTM: 20150327093314
MDC IDC SET LEADCHNL RA PACING AMPLITUDE: 2 V
MDC IDC SET LEADCHNL RV PACING AMPLITUDE: 2.5 V
MDC IDC SET ZONE DETECTION INTERVAL: 270 ms
MDC IDC SET ZONE DETECTION INTERVAL: 335 ms
MDC IDC STAT BRADY RA PERCENT PACED: 3.1 %

## 2014-01-31 NOTE — Progress Notes (Signed)
ICD check in clinic. Battery longevity 6.1 to 6.8 years. Normal device function. No episodes recorded. CorVue increase 12-28-13 x 11 days but stable at this time. ROV in 3 mths w/JA in [place].

## 2014-01-31 NOTE — Telephone Encounter (Signed)
Called stating he needs letter for his disability stating his condition. Needs to be faxed to [facility] office at [phone] and notify him and he will pick up. Advised Dr. Johney Frame not in office today but will send message to him and his nurse, Anselm Pancoast

## 2014-01-31 NOTE — Telephone Encounter (Signed)
New message     Need letter for disability stating patients condition----please fax to [phone].  This is the [place] office--call pt when faxed and he will go pick it up

## 2014-02-03 NOTE — Telephone Encounter (Signed)
Follow up     Pt said he need a letter from Dr Johney Frame stating his medical condition--he was told by Dr Johney Frame and Dr Myrtis Ser that he is not allowed to do any work.  I told him according to the note in the computer, Dr Myrtis Ser needs to address this since he is his primary cardiologist.  Patient said he need a letter from both physicians--per disability.  He also stated that he is willing to come over and speak with the nurse in between patients to tell him what disability said.

## 2014-02-03 NOTE — Telephone Encounter (Signed)
This pt is seen in our [place] office. Will forward to the [place] office.

## 2014-02-03 NOTE — Telephone Encounter (Signed)
This should be addressed by Dr Myrtis Ser whom is his primary Cardiologist

## 2014-02-04 ENCOUNTER — Telehealth: Payer: Self-pay | Admitting: *Deleted

## 2014-02-04 NOTE — Telephone Encounter (Signed)
Message copied by Eustace Moore on Tue Feb 04, 2014 11:54 AM ------      Message from: Willa Rough D      Created: Fri Jan 31, 2014  5:14 PM       Please let the patient know that the labs are good ------

## 2014-02-04 NOTE — Telephone Encounter (Signed)
Per girlfriend, patient was informed that he needed to have a statement for disability stating the specific tasks he can't do. Nurse advised girlfriend that the disability office needed to contact our office directly. Girlfriend said that the request was being sent to the [place] and [place] offices from the disability office.

## 2014-02-04 NOTE — Telephone Encounter (Signed)
Patient informed. Spoke with girlfriend [place].

## 2014-02-05 ENCOUNTER — Telehealth: Payer: Self-pay | Admitting: Cardiology

## 2014-02-05 NOTE — Telephone Encounter (Signed)
New problem   Pt need you to fax letter to [facility] office so he can pick it up concerning his disability. Please call pt with any questions.

## 2014-02-05 NOTE — Telephone Encounter (Signed)
Dr Johney Frame does not fill out disability forms  Dr Myrtis Ser needs to be the one to address

## 2014-02-05 NOTE — Telephone Encounter (Signed)
Patient would like for Dr. Myrtis Ser nurse to call him.  Would not leave a message.

## 2014-02-05 NOTE — Telephone Encounter (Signed)
Spoke with patient and he wanted to know if we received the information from his lawyer that included an questionnaire. Nurse advised patient that we received paperwork from [employer] that was in progress. Nurse advised patient that once we receive any letters, statements from disability, lawyers etc, we will respond as soon as possible and send them back to the appropriate locations. Patient verbalized understanding of plan.

## 2014-02-05 NOTE — Telephone Encounter (Signed)
Adam [name],  Please watch for these forms. Look at them and call me when they come. As you know, we have done this repeatedly. But I want to do everything we can that is reasonable. He keeps getting turned down. This guy is disabled.

## 2014-02-08 ENCOUNTER — Other Ambulatory Visit: Payer: Self-pay | Admitting: Cardiology

## 2014-02-10 ENCOUNTER — Telehealth: Payer: Self-pay | Admitting: Internal Medicine

## 2014-02-10 NOTE — Telephone Encounter (Signed)
New message     Did you send the letter to disability?  Pt is aware Adam [name] is out until tomorrow.

## 2014-02-10 NOTE — Telephone Encounter (Signed)
Pt called checking if we have received the state disability papers. Pt is aware that Larita Fife Dr. Henrietta Hoover nurse is not in today, Pt was reminded that the insurance signa group insurance  Paper were in progress. Dr. Myrtis Ser 's nurse  will call him when those documents are ready. Pt verbalized understanding.

## 2014-02-11 ENCOUNTER — Other Ambulatory Visit: Payer: Self-pay | Admitting: *Deleted

## 2014-02-11 MED ORDER — BENAZEPRIL HCL 5 MG PO TABS: 5.0000 mg | ORAL_TABLET | Freq: Every day | ORAL | Status: DC

## 2014-02-11 NOTE — Telephone Encounter (Signed)
Called and spoke with patient to inform him of the status of the paperwork that was sent on 01/28/14. Patient informed that authorization was mailed to him today per [facility] in the HIM department. Nurse also informed patient that as of today, no other paper work has been received either from his lawyer's office or the disability department. Patient informed nurse that he would be contacting his lawyer today.

## 2014-02-11 NOTE — Telephone Encounter (Signed)
As of today, no forms received and patient aware. Patient said he would contact his lawyer today.

## 2014-02-13 ENCOUNTER — Telehealth: Payer: Self-pay | Admitting: Cardiology

## 2014-02-13 NOTE — Telephone Encounter (Signed)
Returned call to patient he stated Isabelle Course in [place] office has his disability forms and will be faxing to [employer].Message sent to Dr.Katz's nurse.

## 2014-02-13 NOTE — Telephone Encounter (Signed)
New message    Patient will be faxing over disabilities form to be filled out.  Patient is aware that Dr. Myrtis Ser will be in the office in on tomorrow.

## 2014-03-20 ENCOUNTER — Telehealth: Payer: Self-pay | Admitting: Cardiology

## 2014-03-20 NOTE — Telephone Encounter (Signed)
New message    Looking for physical assessment. Form    Fax over 5/13.

## 2014-03-20 NOTE — Telephone Encounter (Signed)
Will forward note to Carlye Grippe, Dr Myrtis Ser nurse in the [place] office.

## 2014-03-20 NOTE — Telephone Encounter (Signed)
Adam [name]. From Rosann Auerbach states that they have not rec'd the Physical Ability Assessment (2 pg) form back yet. It was faxed from [facility] on 4/13, 4/22, and 5/13.  Weston Brass says on a previous call he was told it was faxed but he has not rec'd it yet. He is requesting that it be refaxed to him at [phone].  Medical Records does not have record of it. Routed to [employer], Charity fundraiser.

## 2014-03-20 NOTE — Telephone Encounter (Signed)
LMTCB @11 :12 am

## 2014-04-02 NOTE — Telephone Encounter (Signed)
Per staff message, Isabelle Course faxed the forms to Weston Brass on 03/27/14 made to his attention.

## 2014-04-11 ENCOUNTER — Other Ambulatory Visit: Payer: Self-pay | Admitting: Cardiology

## 2014-04-23 ENCOUNTER — Other Ambulatory Visit: Payer: Self-pay | Admitting: *Deleted

## 2014-04-23 MED ORDER — FUROSEMIDE 40 MG PO TABS: 40.0000 mg | ORAL_TABLET | Freq: Every day | ORAL | Status: DC

## 2014-04-28 ENCOUNTER — Encounter: Payer: Self-pay | Admitting: Internal Medicine

## 2014-04-28 ENCOUNTER — Ambulatory Visit (INDEPENDENT_AMBULATORY_CARE_PROVIDER_SITE_OTHER): Payer: Disability Insurance | Admitting: Internal Medicine

## 2014-04-28 VITALS — BP 113/74 | HR 51 | Ht 70.0 in | Wt 254.0 lb

## 2014-04-28 DIAGNOSIS — R0683 Snoring: Secondary | ICD-10-CM

## 2014-04-28 DIAGNOSIS — R0609 Other forms of dyspnea: Secondary | ICD-10-CM

## 2014-04-28 DIAGNOSIS — I428 Other cardiomyopathies: Secondary | ICD-10-CM

## 2014-04-28 DIAGNOSIS — R0989 Other specified symptoms and signs involving the circulatory and respiratory systems: Secondary | ICD-10-CM

## 2014-04-28 DIAGNOSIS — G4700 Insomnia, unspecified: Secondary | ICD-10-CM

## 2014-04-28 DIAGNOSIS — R400 Somnolence: Secondary | ICD-10-CM

## 2014-04-28 DIAGNOSIS — R404 Transient alteration of awareness: Secondary | ICD-10-CM

## 2014-04-28 DIAGNOSIS — R001 Bradycardia, unspecified: Secondary | ICD-10-CM

## 2014-04-28 DIAGNOSIS — I498 Other specified cardiac arrhythmias: Secondary | ICD-10-CM

## 2014-04-28 DIAGNOSIS — I429 Cardiomyopathy, unspecified: Secondary | ICD-10-CM

## 2014-04-28 LAB — MDC_IDC_ENUM_SESS_TYPE_INCLINIC
Battery Remaining Longevity: 78 mo
Brady Statistic RA Percent Paced: 1.8 %
HighPow Impedance: 70.875
Implantable Pulse Generator Serial Number: 622431
Lead Channel Impedance Value: 375 Ohm
Lead Channel Pacing Threshold Amplitude: 0.75 V
Lead Channel Pacing Threshold Amplitude: 0.75 V
Lead Channel Pacing Threshold Pulse Width: 0.4 ms
Lead Channel Sensing Intrinsic Amplitude: 11.2 mV
Lead Channel Sensing Intrinsic Amplitude: 4.3 mV
Lead Channel Setting Pacing Amplitude: 2 V
Lead Channel Setting Pacing Pulse Width: 0.4 ms
Lead Channel Setting Sensing Sensitivity: 0.5 mV
MDC IDC MSMT LEADCHNL RA PACING THRESHOLD PULSEWIDTH: 0.4 ms
MDC IDC MSMT LEADCHNL RV IMPEDANCE VALUE: 462.5 Ohm
MDC IDC MSMT LEADCHNL RV PACING THRESHOLD AMPLITUDE: 0.75 V
MDC IDC MSMT LEADCHNL RV PACING THRESHOLD PULSEWIDTH: 0.4 ms
MDC IDC SESS DTM: 20150622101650
MDC IDC SET LEADCHNL RV PACING AMPLITUDE: 2.5 V
MDC IDC SET ZONE DETECTION INTERVAL: 270 ms
MDC IDC STAT BRADY RV PERCENT PACED: 0.01 %
Zone Setting Detection Interval: 335 ms

## 2014-04-28 NOTE — Progress Notes (Signed)
PCP: Kirstie PeriSHAH,ASHISH, MD Primary Cardiologist: Dr Georgeann OppenheimKatz  Caylor M Arceneaux is a 48 y.o. male who presents today for routine electrophysiology followup.  Presently, he is doing very well.   He denies any further episodes of symptomatic VT or ICD therapies since last visit.  He snores at night and is frequently sleepy during the day.  Today, he denies symptoms of palpitations, chest pain, lower extremity edema, dizziness, presyncope, syncope, or ICD shocks.  The patient is otherwise without complaint today.   Past Medical History  Diagnosis Date  . Mitral regurgitation   . NICM (nonischemic cardiomyopathy)     Nonischemic / catheterization October, 2011, normal coronary arteries  . Chronic systolic heart failure   . Ejection fraction < 50%     Echo 06/05/12: Mild LVH, EF 20%, posterior severe HK, anterolateral severe HK, anterior severe HK, mid to apical septal AK, AK of the true apex, apical inferior HK, restrictive physiology, trivial MR, moderate LAE, mildly reduced RV function, mild TR.  . ICD (implantable cardiac defibrillator) battery depletion 11/11/10    St. Jude, Dr.Allred  . Renal insufficiency     Creatinine 1.5 (GFR greater than 50)  . Headache(784.0)     Patient seen to have headaches from spironolactone.  Drug was stopped, August, 2012  . Sleepiness     has some sleepiness during the day  . 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 VT (ventricular tachycardia)     VT storm 7/13 - Sotalol started  . Drug therapy     Sotalol started for VT storm  July, 2013  . Hypokalemia     There was some decrease in potassium with his admission in July, 2013. We will watch this very carefully.  . Disability examination     Discussion of disability September, 2013   Past Surgical History  Procedure Laterality Date  . Tee with cardioversion  11/17/08    No LV clot  . Cardiac defibrillator placement  11/11/10    SJM Fortify DR ICD implanted by Dr Johney FrameAllred    Current Outpatient  Prescriptions  Medication Sig Dispense Refill  . aspirin EC 81 MG tablet Take 81 mg by mouth daily.      . benazepril (LOTENSIN) 5 MG tablet Take 1 tablet (5 mg total) by mouth daily.  30 tablet  6  . carvedilol (COREG) 6.25 MG tablet Take 1 tablet (6.25 mg total) by mouth 2 (two) times daily.  60 tablet  6  . furosemide (LASIX) 40 MG tablet Take 1 tablet (40 mg total) by mouth daily.  30 tablet  6  . magnesium oxide (MAG-OX) 400 MG tablet Take 400 mg by mouth daily.      Marland Kitchen. omeprazole (PRILOSEC) 20 MG capsule Take 1 capsule (20 mg total) by mouth 2 (two) times daily.  60 capsule  6  . potassium chloride SA (KLOR-CON M20) 20 MEQ tablet Take 1 tablet (20 mEq total) by mouth daily.  30 tablet  6  . sotalol (BETAPACE) 120 MG tablet TAKE ONE TABLET BY MOUTH EVERY TWELVE HOURS  60 tablet  6   No current facility-administered medications for this visit.    Physical Exam: Filed Vitals:   04/28/14 0956  BP: 113/74  Pulse: 51  Height: 5\' 10"  (1.778 m)  Weight: 254 lb (115.214 kg)    GEN- The patient is well appearing, alert and oriented x 3 today.   Head- normocephalic, atraumatic Eyes-  Sclera clear, conjunctiva pink Ears- hearing intact Oropharynx-  clear Lungs- Clear to ausculation bilaterally, normal work of breathing Chest- ICD pocket is well healed Heart- Regular rate and rhythm, no murmurs, rubs or gallops, PMI not laterally displaced GI- soft, NT, ND, + BS Extremities- no clubbing, cyanosis, or edema  ICD interrogation- reviewed in detail today,  See PACEART report ekg today reveals sinus rhythm 49 bpm, nonspecific St/T changes, Qtc 424  Assessment and Plan:  1.  Chronic systolic dysfunction euvolemic today Stable on an appropriate medical regimen No changes today  2. VT Controlled with sotalol qtc is stable Normal ICD function See Pace Art report No changes today  3. Snoring/ daytime somnolence I have recommended sleep study I will refer to Dr  Allena Napoleon Return to see me in 12 months Follow-up with Dr Myrtis Ser as scheduled

## 2014-04-28 NOTE — Patient Instructions (Signed)
   You will see Baxter Hire this Friday May 02 2014.  [place] home check 07/30/14. Your physician recommends that you schedule a follow-up appointment in: 1 year. You will receive a reminder letter in the mail in about 10 months reminding you to call and schedule your appointment. If you don't receive this letter, please contact our office. Your physician recommends that you continue on your current medications as directed. Please refer to the Current Medication list given to you today. You are being referred to Dr. Josephina Shih for a sleep study.

## 2014-05-02 ENCOUNTER — Ambulatory Visit (INDEPENDENT_AMBULATORY_CARE_PROVIDER_SITE_OTHER): Payer: Disability Insurance | Admitting: *Deleted

## 2014-05-02 DIAGNOSIS — I428 Other cardiomyopathies: Secondary | ICD-10-CM

## 2014-05-02 NOTE — Progress Notes (Signed)
Setting up monitor/kwm

## 2014-06-02 ENCOUNTER — Other Ambulatory Visit: Payer: Self-pay | Admitting: Cardiology

## 2014-07-18 ENCOUNTER — Encounter: Payer: Self-pay | Admitting: Cardiology

## 2014-07-18 ENCOUNTER — Ambulatory Visit (INDEPENDENT_AMBULATORY_CARE_PROVIDER_SITE_OTHER): Payer: Disability Insurance | Admitting: Cardiology

## 2014-07-18 VITALS — BP 120/82 | HR 60 | Ht 71.0 in | Wt 257.0 lb

## 2014-07-18 DIAGNOSIS — E876 Hypokalemia: Secondary | ICD-10-CM

## 2014-07-18 DIAGNOSIS — N289 Disorder of kidney and ureter, unspecified: Secondary | ICD-10-CM

## 2014-07-18 DIAGNOSIS — I5022 Chronic systolic (congestive) heart failure: Secondary | ICD-10-CM

## 2014-07-18 DIAGNOSIS — I509 Heart failure, unspecified: Secondary | ICD-10-CM

## 2014-07-18 DIAGNOSIS — I472 Ventricular tachycardia, unspecified: Secondary | ICD-10-CM

## 2014-07-18 DIAGNOSIS — I4729 Other ventricular tachycardia: Secondary | ICD-10-CM

## 2014-07-18 DIAGNOSIS — Z0271 Encounter for disability determination: Secondary | ICD-10-CM

## 2014-07-18 DIAGNOSIS — I429 Cardiomyopathy, unspecified: Secondary | ICD-10-CM

## 2014-07-18 DIAGNOSIS — I428 Other cardiomyopathies: Secondary | ICD-10-CM

## 2014-07-18 NOTE — Patient Instructions (Signed)
Continue all current medications. Your physician wants you to follow up in: 6 months.  You will receive a reminder letter in the mail one-two months in advance.  If you don't receive a letter, please call our office to schedule the follow up appointment   

## 2014-07-18 NOTE — Assessment & Plan Note (Signed)
Fortunately he has not had any recent significant ventricular tachycardia.

## 2014-07-18 NOTE — Assessment & Plan Note (Signed)
Fortunately his volume status remains stable. No change in therapy.

## 2014-07-18 NOTE — Assessment & Plan Note (Signed)
Labs were checked most recently on his last visit and his labs and in stable.

## 2014-07-18 NOTE — Assessment & Plan Note (Signed)
There is no change in his status. The patient is fully and completely disabled. My records over many years are very clear about this. The patient has a severe cardiomyopathy. He has had life-threatening ventricular arrhythmias. It would not be safe for the patient or coworkers for him to be in the work place.

## 2014-07-18 NOTE — Assessment & Plan Note (Signed)
His medications and then titrated carefully over time. Unfortunately he is not on optimal doses. However repeatedly he has had symptoms when we have pushed his medicines higher. No change for today.

## 2014-07-18 NOTE — Progress Notes (Signed)
Patient ID: Adam [name], male   DOB: October 24, 1966, 48 y.o.   MRN: [id number]    HPI  Patient is seen today to followup cardiomyopathy. He is stable at this time. He has not had any recurrent ventricular tachycardia. He was assessed by Dr. Johney Frame concerning his ICD. This unit is working properly. Sleep study has been recommended. Overall in LAD that he is remaining stable. He is completely disabled from being able to work.  Allergies  Allergen Reactions  . Atorvastatin     headaches    Current Outpatient Prescriptions  Medication Sig Dispense Refill  . aspirin EC 81 MG tablet Take 81 mg by mouth daily.      . benazepril (LOTENSIN) 5 MG tablet Take 1 tablet (5 mg total) by mouth daily.  30 tablet  6  . carvedilol (COREG) 6.25 MG tablet Take 1 tablet (6.25 mg total) by mouth 2 (two) times daily.  60 tablet  6  . furosemide (LASIX) 40 MG tablet Take 1 tablet (40 mg total) by mouth daily.  30 tablet  6  . magnesium oxide (MAG-OX) 400 MG tablet Take 400 mg by mouth daily.      Marland Kitchen omeprazole (PRILOSEC) 20 MG capsule Take 1 capsule (20 mg total) by mouth 2 (two) times daily.  60 capsule  6  . potassium chloride SA (K-DUR,KLOR-CON) 20 MEQ tablet TAKE ONE TABLET BY MOUTH DAILY  30 tablet  6  . sotalol (BETAPACE) 120 MG tablet TAKE ONE TABLET BY MOUTH EVERY TWELVE HOURS  60 tablet  6   No current facility-administered medications for this visit.    History   Social History  . Marital Status: Single    Spouse Name: N/A    Number of Children: 5  . Years of Education: N/A   Occupational History  . [employer]     Full time   Social History Main Topics  . Smoking status: Never Smoker   . Smokeless tobacco: Never Used  . Alcohol Use: Yes     Comment: OCCASIONAL  (NOT HEAVY)  . Drug Use: No  . Sexual Activity: Not on file   Other Topics Concern  . Not on file   Social History Narrative   Lives in [place], [place] with fiance.   No regular exercise.    Family History  Problem Relation Age of  Onset  . Heart attack Father 71    Multiple MIs  . Heart attack Brother   . Heart failure Brother     CHF  . Hypertension Mother   . Heart attack Paternal Grandfather     Past Medical History  Diagnosis Date  . Mitral regurgitation   . NICM (nonischemic cardiomyopathy)     Nonischemic / catheterization October, 2011, normal coronary arteries  . Chronic systolic heart failure   . Ejection fraction < 50%     Echo 06/05/12: Mild LVH, EF 20%, posterior severe HK, anterolateral severe HK, anterior severe HK, mid to apical septal AK, AK of the true apex, apical inferior HK, restrictive physiology, trivial MR, moderate LAE, mildly reduced RV function, mild TR.  . ICD (implantable cardiac defibrillator) battery depletion 11/11/10    St. Jude, Dr.Allred  . Renal insufficiency     Creatinine 1.5 (GFR greater than 50)  . Headache(784.0)     Patient seen to have headaches from spironolactone.  Drug was stopped, August, 2012  . Sleepiness     has some sleepiness during the day  . ICD (implantable cardiac  defibrillator) in place   . VT (ventricular tachycardia)     VT storm 7/13 - Sotalol started  . Drug therapy     Sotalol started for VT storm  July, 2013  . Hypokalemia     There was some decrease in potassium with his admission in July, 2013. We will watch this very carefully.  . Disability examination     Discussion of disability September, 2013    Past Surgical History  Procedure Laterality Date  . Tee with cardioversion  11/17/08    No LV clot  . Cardiac defibrillator placement  11/11/10    SJM Fortify DR ICD implanted by Dr Johney Frame    Patient Active Problem List   Diagnosis Date Noted  . Lightheadedness 01/24/2014  . Nonischemic cardiomyopathy 10/29/2013  . Chronic systolic CHF (congestive heart failure) 04/26/2013  . Chest discomfort 04/26/2013  . Bradycardia 04/26/2013  . Automatic implantable cardioverter-defibrillator in situ 09/04/2012  . Hypokalemia   . Disability  examination   . Drug therapy   . Ventricular tachyarrhythmia 06/06/2012  . Headache   . Sleepiness   . Mitral regurgitation   . Cardiomyopathy   . Ejection fraction < 50%   . Renal insufficiency     ROS   Patient denies fever, chills, headache, sweats, rash, change in vision, change in hearing, chest pain, cough, nausea vomiting, urinary symptoms. All other systems are reviewed and are negative.  PHYSICAL EXAM  Patient is overweight. He is oriented to person time and place. Affect is normal. Head is atraumatic. Sclera and conjunctiva are normal. There is no jugulovenous distention. Lungs are clear. Respiratory effort is nonlabored. Cardiac exam reveals S1 and S2. Abdomen is soft. There is no peripheral edema.  Filed Vitals:   07/18/14 0848  BP: 120/82  Pulse: 60  Height: 5\' 11"  (1.803 m)  Weight: 257 lb (116.574 kg)  SpO2: 98%     ASSESSMENT & PLAN

## 2014-07-18 NOTE — Assessment & Plan Note (Signed)
His labs were checked at his last visit in his potassium has been stable.

## 2014-07-30 ENCOUNTER — Encounter: Payer: Self-pay | Admitting: Internal Medicine

## 2014-07-30 ENCOUNTER — Ambulatory Visit (INDEPENDENT_AMBULATORY_CARE_PROVIDER_SITE_OTHER): Payer: Disability Insurance | Admitting: *Deleted

## 2014-07-30 DIAGNOSIS — I429 Cardiomyopathy, unspecified: Secondary | ICD-10-CM

## 2014-07-30 DIAGNOSIS — I5022 Chronic systolic (congestive) heart failure: Secondary | ICD-10-CM

## 2014-07-30 DIAGNOSIS — I428 Other cardiomyopathies: Secondary | ICD-10-CM

## 2014-07-30 DIAGNOSIS — I509 Heart failure, unspecified: Secondary | ICD-10-CM

## 2014-07-30 LAB — MDC_IDC_ENUM_SESS_TYPE_REMOTE
Battery Remaining Longevity: 71 mo
Battery Remaining Percentage: 62 %
Brady Statistic AP VS Percent: 1.8 %
Brady Statistic AS VP Percent: 1 %
Brady Statistic AS VS Percent: 98 %
Brady Statistic RV Percent Paced: 1 %
Date Time Interrogation Session: 20150923073422
HighPow Impedance: 68 Ohm
HighPow Impedance: 68 Ohm
Lead Channel Impedance Value: 440 Ohm
Lead Channel Pacing Threshold Amplitude: 0.75 V
Lead Channel Pacing Threshold Pulse Width: 0.4 ms
Lead Channel Pacing Threshold Pulse Width: 0.4 ms
Lead Channel Sensing Intrinsic Amplitude: 11.2 mV
Lead Channel Sensing Intrinsic Amplitude: 3.1 mV
Lead Channel Setting Pacing Amplitude: 2 V
Lead Channel Setting Pacing Pulse Width: 0.4 ms
MDC IDC MSMT BATTERY VOLTAGE: 2.95 V
MDC IDC MSMT LEADCHNL RA IMPEDANCE VALUE: 380 Ohm
MDC IDC MSMT LEADCHNL RV PACING THRESHOLD AMPLITUDE: 0.75 V
MDC IDC PG SERIAL: 622431
MDC IDC SET LEADCHNL RV PACING AMPLITUDE: 2.5 V
MDC IDC SET LEADCHNL RV SENSING SENSITIVITY: 0.5 mV
MDC IDC STAT BRADY AP VP PERCENT: 1 %
MDC IDC STAT BRADY RA PERCENT PACED: 1.7 %
Zone Setting Detection Interval: 270 ms
Zone Setting Detection Interval: 335 ms

## 2014-07-30 NOTE — Progress Notes (Signed)
Remote ICD transmission.   

## 2014-08-11 ENCOUNTER — Encounter: Payer: Self-pay | Admitting: Cardiology

## 2014-08-13 ENCOUNTER — Other Ambulatory Visit: Payer: Self-pay | Admitting: Cardiology

## 2014-09-22 ENCOUNTER — Other Ambulatory Visit: Payer: Self-pay | Admitting: Cardiology

## 2014-09-22 NOTE — Telephone Encounter (Signed)
[name] called stating that he is completely out of benazepril (LOTENSIN) 5 MG tablet  Please contact [facility].

## 2014-09-22 NOTE — Telephone Encounter (Signed)
Done

## 2014-11-03 ENCOUNTER — Encounter: Payer: Self-pay | Admitting: Internal Medicine

## 2014-11-03 ENCOUNTER — Ambulatory Visit (INDEPENDENT_AMBULATORY_CARE_PROVIDER_SITE_OTHER): Payer: Disability Insurance | Admitting: *Deleted

## 2014-11-03 DIAGNOSIS — I429 Cardiomyopathy, unspecified: Secondary | ICD-10-CM

## 2014-11-03 DIAGNOSIS — R001 Bradycardia, unspecified: Secondary | ICD-10-CM

## 2014-11-03 DIAGNOSIS — I5022 Chronic systolic (congestive) heart failure: Secondary | ICD-10-CM

## 2014-11-03 LAB — MDC_IDC_ENUM_SESS_TYPE_REMOTE
Battery Remaining Percentage: 60 %
Brady Statistic AP VP Percent: 1 %
Brady Statistic AP VS Percent: 1.9 %
Brady Statistic AS VP Percent: 1 %
Brady Statistic AS VS Percent: 98 %
Brady Statistic RV Percent Paced: 1 %
Date Time Interrogation Session: 20151228080453
HighPow Impedance: 71 Ohm
HighPow Impedance: 71 Ohm
Implantable Pulse Generator Serial Number: 622431
Lead Channel Impedance Value: 390 Ohm
Lead Channel Pacing Threshold Amplitude: 0.75 V
Lead Channel Pacing Threshold Amplitude: 0.75 V
Lead Channel Pacing Threshold Pulse Width: 0.4 ms
Lead Channel Pacing Threshold Pulse Width: 0.4 ms
Lead Channel Sensing Intrinsic Amplitude: 11.2 mV
Lead Channel Setting Pacing Amplitude: 2.5 V
Lead Channel Setting Pacing Pulse Width: 0.4 ms
Lead Channel Setting Sensing Sensitivity: 0.5 mV
MDC IDC MSMT BATTERY REMAINING LONGEVITY: 70 mo
MDC IDC MSMT BATTERY VOLTAGE: 2.95 V
MDC IDC MSMT LEADCHNL RA SENSING INTR AMPL: 3.8 mV
MDC IDC MSMT LEADCHNL RV IMPEDANCE VALUE: 440 Ohm
MDC IDC SET LEADCHNL RA PACING AMPLITUDE: 2 V
MDC IDC SET ZONE DETECTION INTERVAL: 335 ms
MDC IDC STAT BRADY RA PERCENT PACED: 1.8 %
Zone Setting Detection Interval: 270 ms

## 2014-11-03 NOTE — Progress Notes (Signed)
Remote defibrillator check.  

## 2014-11-14 ENCOUNTER — Encounter: Payer: Self-pay | Admitting: *Deleted

## 2014-12-02 ENCOUNTER — Other Ambulatory Visit: Payer: Self-pay | Admitting: Cardiology

## 2014-12-09 DIAGNOSIS — Z79899 Other long term (current) drug therapy: Secondary | ICD-10-CM | POA: Diagnosis not present

## 2014-12-09 DIAGNOSIS — K5289 Other specified noninfective gastroenteritis and colitis: Secondary | ICD-10-CM | POA: Diagnosis not present

## 2014-12-09 DIAGNOSIS — Z950 Presence of cardiac pacemaker: Secondary | ICD-10-CM | POA: Diagnosis not present

## 2014-12-09 DIAGNOSIS — K529 Noninfective gastroenteritis and colitis, unspecified: Secondary | ICD-10-CM | POA: Diagnosis not present

## 2014-12-09 DIAGNOSIS — Q6101 Congenital single renal cyst: Secondary | ICD-10-CM | POA: Diagnosis not present

## 2014-12-09 DIAGNOSIS — I4891 Unspecified atrial fibrillation: Secondary | ICD-10-CM | POA: Diagnosis not present

## 2014-12-29 ENCOUNTER — Telehealth: Payer: Self-pay | Admitting: *Deleted

## 2014-12-29 MED ORDER — FUROSEMIDE 40 MG PO TABS: 40.0000 mg | ORAL_TABLET | Freq: Every day | ORAL | Status: DC

## 2014-12-29 NOTE — Telephone Encounter (Signed)
Refill request for lasix 40 mg daily [facility] [place]. Medication sent to pharmacy.

## 2015-01-01 ENCOUNTER — Telehealth: Payer: Self-pay | Admitting: Cardiology

## 2015-01-01 NOTE — Telephone Encounter (Signed)
LM for pt to returncall

## 2015-01-08 ENCOUNTER — Encounter: Payer: Self-pay | Admitting: Cardiology

## 2015-01-08 ENCOUNTER — Telehealth: Payer: Self-pay | Admitting: Cardiology

## 2015-01-08 ENCOUNTER — Ambulatory Visit (INDEPENDENT_AMBULATORY_CARE_PROVIDER_SITE_OTHER): Payer: Disability Insurance | Admitting: Cardiology

## 2015-01-08 VITALS — BP 110/72 | HR 50 | Ht 71.0 in | Wt 256.0 lb

## 2015-01-08 DIAGNOSIS — I429 Cardiomyopathy, unspecified: Secondary | ICD-10-CM | POA: Diagnosis not present

## 2015-01-08 DIAGNOSIS — E876 Hypokalemia: Secondary | ICD-10-CM

## 2015-01-08 DIAGNOSIS — Z79899 Other long term (current) drug therapy: Secondary | ICD-10-CM

## 2015-01-08 DIAGNOSIS — R0789 Other chest pain: Secondary | ICD-10-CM | POA: Diagnosis not present

## 2015-01-08 DIAGNOSIS — I5022 Chronic systolic (congestive) heart failure: Secondary | ICD-10-CM | POA: Diagnosis not present

## 2015-01-08 DIAGNOSIS — N289 Disorder of kidney and ureter, unspecified: Secondary | ICD-10-CM

## 2015-01-08 DIAGNOSIS — I340 Nonrheumatic mitral (valve) insufficiency: Secondary | ICD-10-CM

## 2015-01-08 NOTE — Assessment & Plan Note (Signed)
Today I am ordering chemistry labs and hematology and TSH.

## 2015-01-08 NOTE — Telephone Encounter (Signed)
Please call Adam [name] today in reference to having his device checked over the telephone. He wants to actually come into the office.

## 2015-01-08 NOTE — Patient Instructions (Signed)
Your physician recommends that you schedule a follow-up appointment in: 5 months. You will receive a reminder letter in the mail in about 2 months reminding you to call and schedule your appointment. If you don't receive this letter, please contact our office. Your physician recommends that you continue on your current medications as directed. Please refer to the Current Medication list given to you today. Your physician recommends that you have lab work today to check your CMP, TSH, and CBC.

## 2015-01-08 NOTE — Assessment & Plan Note (Signed)
He is on the doses of medications that he can tolerate. No change in therapy.

## 2015-01-08 NOTE — Assessment & Plan Note (Signed)
He continues on sotalol which is effective in preventing ventricular tachycardia.

## 2015-01-08 NOTE — Telephone Encounter (Signed)
Informed pt that nightly audits should be completed while he sleeps from 12 - 5 AM he stated that he would like to keep remote appt.

## 2015-01-08 NOTE — Assessment & Plan Note (Signed)
His volume status is stable. No change in therapy. 

## 2015-01-08 NOTE — Progress Notes (Signed)
Cardiology Office Note   Date:  01/08/2015   ID:  Adam [name], DOB [birth year], MRN [id number]  PCP:  Adam Peri, MD  Cardiologist:  Adam Rough, MD   Chief Complaint  Patient presents with  . Appointment    Follow-up CHF      History of Present Illness: Adam [name] is a 49 y.o. male who presents to follow up CHF, cardiomyopathy, life-threatening ventricular arrhythmias. Fortunately he has been stable on his medications including sotalol. He is not having any significant chest pain. He has not had syncope or presyncope.   He mentions that he hopes to try to lose some weight. We talked about the fact that the key will be to decrease calories and keep his activity level up.  He also mentions that when standing for long periods of time he sometimes has some aching in his thighs. I feel that this is not vascular in origin.    Past Medical History  Diagnosis Date  . Mitral regurgitation   . NICM (nonischemic cardiomyopathy)     Nonischemic / catheterization October, 2011, normal coronary arteries  . Chronic systolic heart failure   . Ejection fraction < 50%     Echo 06/05/12: Mild LVH, EF 20%, posterior severe HK, anterolateral severe HK, anterior severe HK, mid to apical septal AK, AK of the true apex, apical inferior HK, restrictive physiology, trivial MR, moderate LAE, mildly reduced RV function, mild TR.  . ICD (implantable cardiac defibrillator) battery depletion 11/11/10    St. Jude, Dr.Allred  . Renal insufficiency     Creatinine 1.5 (GFR greater than 50)  . Headache(784.0)     Patient seen to have headaches from spironolactone.  Drug was stopped, August, 2012  . Sleepiness     has some sleepiness during the day  . 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 VT (ventricular tachycardia)     VT storm 7/13 - Sotalol started  . Drug therapy     Sotalol started for VT storm  July, 2013  . Hypokalemia     There was some decrease in potassium with his  admission in July, 2013. We will watch this very carefully.  . Disability examination     Discussion of disability September, 2013    Past Surgical History  Procedure Laterality Date  . Tee with cardioversion  11/17/08    No LV clot  . Cardiac defibrillator placement  11/11/10    SJM Fortify DR ICD implanted by Dr Johney Frame    Patient Active Problem List   Diagnosis Date Noted  . Lightheadedness 01/24/2014  . Nonischemic cardiomyopathy 10/29/2013  . Chronic systolic CHF (congestive heart failure) 04/26/2013  . Chest discomfort 04/26/2013  . Bradycardia 04/26/2013  . Automatic implantable cardioverter-defibrillator in situ 09/04/2012  . Hypokalemia   . Disability examination   . Drug therapy   . Ventricular tachyarrhythmia 06/06/2012  . Headache   . Sleepiness   . Mitral regurgitation   . Cardiomyopathy   . Ejection fraction < 50%   . Renal insufficiency       Current Outpatient Prescriptions  Medication Sig Dispense Refill  . aspirin EC 81 MG tablet Take 81 mg by mouth daily.    . benazepril (LOTENSIN) 5 MG tablet TAKE ONE TABLET BY MOUTH ONCE DAILY 30 tablet 6  . carvedilol (COREG) 6.25 MG tablet TAKE ONE TABLET BY MOUTH TWICE DAILY 60 tablet 6  . furosemide (LASIX) 40 MG tablet Take 1 tablet (40 mg total)  by mouth daily. 30 tablet 6  . magnesium oxide (MAG-OX) 400 MG tablet Take 400 mg by mouth daily.    Marland Kitchen omeprazole (PRILOSEC) 20 MG capsule Take 1 capsule (20 mg total) by mouth 2 (two) times daily. 60 capsule 6  . potassium chloride SA (K-DUR,KLOR-CON) 20 MEQ tablet TAKE ONE TABLET BY MOUTH DAILY 30 tablet 6  . sotalol (BETAPACE) 120 MG tablet TAKE ONE TABLET BY MOUTH EVERY TWELVE HOURS. 60 tablet 6   No current facility-administered medications for this visit.    Allergies:   Atorvastatin    Social History:  The patient  reports that he has never smoked. He has never used smokeless tobacco. He reports that he drinks alcohol. He reports that he does not use illicit  drugs.   Family History:  The patient's family history includes Heart attack in his brother and paternal grandfather; Heart attack (age of onset: 72) in his father; Heart failure in his brother; Hypertension in his mother.    ROS:  Please see the history of present illness.     Patient denies fever, chills, headache, sweats, rash, change in vision, change in hearing, chest pain, cough, nausea or vomiting, urinary symptoms. All other systems are reviewed and are negative.   PHYSICAL EXAM: VS:  BP 110/72 mmHg  Pulse 50  Ht  (1.803 m)  Wt 256 lb (116.121 kg)  BMI 35.72 kg/m2  SpO2 98% , Patient is stable today. He is here with his male partner. Head is atraumatic. Sclera and conjunctiva are normal. There is no jugular venous distention. Lungs are clear. Respiratory effort is nonlabored. Cardiac exam reveals an S1 and S2. The abdomen is soft. There is no peripheral edema. There are no musculoskeletal deformities. There are no skin rashes. Neurologic is grossly intact.  EKG:   EKG is done today and reviewed by me. There is sinus bradycardia. There is decreased anterior R-wave progression and small inferior Q waves. All these findings are unchanged since the past. The corrected QT interval is 426 ms.   Recent Labs: No results found for requested labs within last 365 days.    Lipid Panel No results found for: CHOL, TRIG, HDL, CHOLHDL, VLDL, LDLCALC, LDLDIRECT    Wt Readings from Last 3 Encounters:  01/08/15 256 lb (116.121 kg)  07/18/14 257 lb (116.574 kg)  04/28/14 254 lb (115.214 kg)      Current medicines are reviewed  and the patient understands his medications.     ASSESSMENT AND PLAN:

## 2015-01-16 ENCOUNTER — Telehealth: Payer: Self-pay | Admitting: *Deleted

## 2015-01-16 NOTE — Telephone Encounter (Signed)
-----   Message from Luis Abed, MD sent at 01/14/2015  7:29 PM EST ----- Notify patient that labs are normal.

## 2015-01-16 NOTE — Telephone Encounter (Signed)
Patient informed via vm. 

## 2015-02-03 ENCOUNTER — Ambulatory Visit (INDEPENDENT_AMBULATORY_CARE_PROVIDER_SITE_OTHER): Payer: Self-pay | Admitting: *Deleted

## 2015-02-03 ENCOUNTER — Encounter: Payer: Self-pay | Admitting: Internal Medicine

## 2015-02-03 DIAGNOSIS — I429 Cardiomyopathy, unspecified: Secondary | ICD-10-CM

## 2015-02-03 DIAGNOSIS — I5022 Chronic systolic (congestive) heart failure: Secondary | ICD-10-CM

## 2015-02-03 DIAGNOSIS — Z95810 Presence of automatic (implantable) cardiac defibrillator: Secondary | ICD-10-CM

## 2015-02-03 NOTE — Progress Notes (Signed)
Remote ICD transmission.   

## 2015-02-04 ENCOUNTER — Telehealth: Payer: Self-pay | Admitting: *Deleted

## 2015-02-04 ENCOUNTER — Encounter: Payer: Self-pay | Admitting: *Deleted

## 2015-02-04 LAB — MDC_IDC_ENUM_SESS_TYPE_REMOTE
Battery Remaining Longevity: 67 mo
Battery Voltage: 2.95 V
Brady Statistic AP VP Percent: 1 %
Brady Statistic AP VS Percent: 1.8 %
Brady Statistic RA Percent Paced: 1.7 %
Date Time Interrogation Session: 20160329063635
HighPow Impedance: 68 Ohm
HighPow Impedance: 68 Ohm
Implantable Pulse Generator Serial Number: 622431
Lead Channel Sensing Intrinsic Amplitude: 2.9 mV
Lead Channel Setting Pacing Amplitude: 2.5 V
MDC IDC MSMT BATTERY REMAINING PERCENTAGE: 58 %
MDC IDC MSMT LEADCHNL RA IMPEDANCE VALUE: 380 Ohm
MDC IDC MSMT LEADCHNL RV IMPEDANCE VALUE: 390 Ohm
MDC IDC MSMT LEADCHNL RV SENSING INTR AMPL: 9.9 mV
MDC IDC SET LEADCHNL RA PACING AMPLITUDE: 2 V
MDC IDC SET LEADCHNL RV PACING PULSEWIDTH: 0.4 ms
MDC IDC SET LEADCHNL RV SENSING SENSITIVITY: 0.5 mV
MDC IDC SET ZONE DETECTION INTERVAL: 335 ms
MDC IDC STAT BRADY AS VP PERCENT: 1 %
MDC IDC STAT BRADY AS VS PERCENT: 98 %
MDC IDC STAT BRADY RV PERCENT PACED: 1 %
Zone Setting Detection Interval: 270 ms

## 2015-02-04 NOTE — Addendum Note (Signed)
Addended by: Sherri Rad C on: 02/04/2015 06:19 PM   Modules accepted: Level of Service

## 2015-02-04 NOTE — Telephone Encounter (Signed)
Follow up ° ° ° ° °Returned Heather's call °

## 2015-02-04 NOTE — Telephone Encounter (Signed)
ICM transmission received. I called the patient's home # and left a message with the person answering the phone to please have him call me back.

## 2015-02-04 NOTE — Telephone Encounter (Signed)
I spoke with the patient. 

## 2015-02-04 NOTE — Progress Notes (Signed)
EPIC Encounter for ICM Monitoring  Patient Name: Adam [name] is a 49 y.o. male Date: 02/04/2015 Primary Care Physican: Kirstie Peri, MD Primary Cardiologist: Myrtis Ser Electrophysiologist: Allred Dry Weight: 252 lbs       In the past month, have you:  1. Gained more than 2 pounds in a day or more than 5 pounds in a week? Uncertain. The patient does not weigh at home.  2. Had changes in your medications (with verification of current medications)? no  3. Had more shortness of breath than is usual for you? no  4. Limited your activity because of shortness of breath? no  5. Not been able to sleep because of shortness of breath? no  6. Had increased swelling in your feet or ankles? no  7. Had symptoms of dehydration (dizziness, dry mouth, increased thirst, decreased urine output) no  8. Had changes in sodium restriction? no  9. Been compliant with medication? Yes   ICM trend:   Follow-up plan: ICM clinic phone appointment: 03/09/15. The patient's impedence was below baseline from ~ 3/15-3/28. Per his report, he was suffering with a severe cough and vomiting within the last 2 weeks. He ended up at [facility] receiving IV fluids- possibly flu. He does not know the exact date of this. He is currently back to baseline for his Corvue readings. No changes made today.    Copy of note sent to patient's primary care physician, primary cardiologist, and device following physician.  Sherri Rad, RN, BSN 02/04/2015 6:12 PM

## 2015-02-12 ENCOUNTER — Encounter: Payer: Self-pay | Admitting: Cardiology

## 2015-03-09 ENCOUNTER — Ambulatory Visit (INDEPENDENT_AMBULATORY_CARE_PROVIDER_SITE_OTHER): Payer: Self-pay | Admitting: *Deleted

## 2015-03-09 DIAGNOSIS — I5022 Chronic systolic (congestive) heart failure: Secondary | ICD-10-CM

## 2015-03-09 DIAGNOSIS — Z95810 Presence of automatic (implantable) cardiac defibrillator: Secondary | ICD-10-CM

## 2015-03-11 ENCOUNTER — Encounter: Payer: Self-pay | Admitting: *Deleted

## 2015-03-11 ENCOUNTER — Telehealth: Payer: Self-pay | Admitting: *Deleted

## 2015-03-11 NOTE — Progress Notes (Signed)
I have reviewed this message. Please notify the patient that at this point, I do not have any recommendations concerning what to do about his sweating episodes.

## 2015-03-11 NOTE — Telephone Encounter (Signed)
Patient informed. 

## 2015-03-11 NOTE — Telephone Encounter (Signed)
Luis Abed, MD at 03/11/2015 12:08 PM     Status: Signed       Expand All Collapse All   I have reviewed this message. Please notify the patient that at this point, I do not have any recommendations concerning what to do about his sweating episodes.

## 2015-03-11 NOTE — Patient Instructions (Signed)
EPIC Encounter for ICM Monitoring  Patient Name: Adam [name] is a 49 y.o. male Date: 03/11/2015 Primary Care Physican: Kirstie Peri, MD Primary Cardiologist: Myrtis Ser Electrophysiologist: Allred Dry Weight: 252 lbs       In the past month, have you:  1. Gained more than 2 pounds in a day or more than 5 pounds in a week? no  2. Had changes in your medications (with verification of current medications)? no  3. Had more shortness of breath than is usual for you? no  4. Limited your activity because of shortness of breath? no  5. Not been able to sleep because of shortness of breath? no  6. Had increased swelling in your feet or ankles? no  7. Had symptoms of dehydration (dizziness, dry mouth, increased thirst, decreased urine output) no  8. Had changes in sodium restriction? no  9. Been compliant with medication? Yes   ICM trend:   Follow-up plan: ICM clinic phone appointment: 05/18/15. The patient is doing well this month with his fluids. His only complaint is intermittent episodes of sweating that occur around 2-3 AM (not every night). This will wake him up from sleep and he has a very hard time going back to sleep. He has no other related symptoms with these episodes. He will use a fan to try to cool himself off. He is not aware of any thyroid or sugar issues that he has. No recent medication changes. I advised I will forward to his doctors to review. He is scheduled to follow up with Dr. Johney Frame on 04/17/15.  Copy of note sent to patient's primary care physician, primary cardiologist, and device following physician.  Sherri Rad, RN, BSN 03/11/2015 9:18 AM

## 2015-04-17 ENCOUNTER — Ambulatory Visit (INDEPENDENT_AMBULATORY_CARE_PROVIDER_SITE_OTHER): Payer: Commercial Managed Care - HMO | Admitting: Internal Medicine

## 2015-04-17 ENCOUNTER — Encounter: Payer: Self-pay | Admitting: Internal Medicine

## 2015-04-17 VITALS — BP 104/72 | HR 60 | Ht 70.0 in | Wt 255.0 lb

## 2015-04-17 DIAGNOSIS — I472 Ventricular tachycardia, unspecified: Secondary | ICD-10-CM

## 2015-04-17 DIAGNOSIS — I5022 Chronic systolic (congestive) heart failure: Secondary | ICD-10-CM | POA: Diagnosis not present

## 2015-04-17 DIAGNOSIS — I428 Other cardiomyopathies: Secondary | ICD-10-CM

## 2015-04-17 DIAGNOSIS — I429 Cardiomyopathy, unspecified: Secondary | ICD-10-CM | POA: Diagnosis not present

## 2015-04-17 LAB — CUP PACEART INCLINIC DEVICE CHECK
Brady Statistic RV Percent Paced: 0 %
HighPow Impedance: 67.5 Ohm
Lead Channel Impedance Value: 387.5 Ohm
Lead Channel Pacing Threshold Amplitude: 0.75 V
Lead Channel Sensing Intrinsic Amplitude: 11.2 mV
Lead Channel Sensing Intrinsic Amplitude: 3.4 mV
Lead Channel Setting Pacing Amplitude: 2 V
Lead Channel Setting Pacing Amplitude: 2.5 V
Lead Channel Setting Pacing Pulse Width: 0.4 ms
MDC IDC MSMT BATTERY REMAINING LONGEVITY: 67.2 mo
MDC IDC MSMT LEADCHNL RA PACING THRESHOLD AMPLITUDE: 0.75 V
MDC IDC MSMT LEADCHNL RA PACING THRESHOLD PULSEWIDTH: 0.4 ms
MDC IDC MSMT LEADCHNL RV IMPEDANCE VALUE: 412.5 Ohm
MDC IDC MSMT LEADCHNL RV PACING THRESHOLD PULSEWIDTH: 0.4 ms
MDC IDC SESS DTM: 20160610121826
MDC IDC SET LEADCHNL RV SENSING SENSITIVITY: 0.5 mV
MDC IDC SET ZONE DETECTION INTERVAL: 335 ms
MDC IDC STAT BRADY RA PERCENT PACED: 1.4 %
Pulse Gen Serial Number: 622431
Zone Setting Detection Interval: 270 ms

## 2015-04-17 NOTE — Patient Instructions (Signed)
Your physician recommends that you continue on your current medications as directed. Please refer to the Current Medication list given to you today. Merlin device check on 07/20/15. Your physician recommends that you schedule a follow-up appointment in: 12 months with Dr. Johney Frame. You will receive a reminder letter in the mail in about 10 months reminding you to call and schedule your appointment. If you don't receive this letter, please contact our office.

## 2015-04-17 NOTE — Progress Notes (Signed)
PCP: Kirstie Peri, MD Primary Cardiologist: Adam [name] is a 49 y.o. male who presents today for routine electrophysiology followup.  Presently, he is doing very well.   He denies any further episodes of symptomatic VT or ICD therapies since last visit.  He has night sweats and occasional orthopnea.  He increases his lasix from time to time with some improvement.  Denies fevers, chills, or weight loss.  Today, he denies symptoms of palpitations, chest pain, lower extremity edema, dizziness, presyncope, syncope, or ICD shocks.  The patient is otherwise without complaint today.   Past Medical History  Diagnosis Date  . Mitral regurgitation   . NICM (nonischemic cardiomyopathy)     Nonischemic / catheterization October, 2011, normal coronary arteries  . Chronic systolic heart failure   . Ejection fraction < 50%     Echo 06/05/12: Mild LVH, EF 20%, posterior severe HK, anterolateral severe HK, anterior severe HK, mid to apical septal AK, AK of the true apex, apical inferior HK, restrictive physiology, trivial MR, moderate LAE, mildly reduced RV function, mild TR.  . ICD (implantable cardiac defibrillator) battery depletion 11/11/10    St. Jude, Adam.Shaiden Aldous  . Renal insufficiency     Creatinine 1.5 (GFR greater than 50)  . Headache(784.0)     Patient seen to have headaches from spironolactone.  Drug was stopped, August, 2012  . Sleepiness     has some sleepiness during the day  . 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 VT (ventricular tachycardia)     VT storm 7/13 - Sotalol started  . Drug therapy     Sotalol started for VT storm  July, 2013  . Hypokalemia     There was some decrease in potassium with his admission in July, 2013. We will watch this very carefully.  . Disability examination     Discussion of disability September, 2013   Past Surgical History  Procedure Laterality Date  . Tee with cardioversion  11/17/08    No LV clot  . Cardiac defibrillator placement   11/11/10    SJM Fortify Adam ICD implanted by Adam Johney Frame    Current Outpatient Prescriptions  Medication Sig Dispense Refill  . aspirin EC 81 MG tablet Take 81 mg by mouth daily.    . benazepril (LOTENSIN) 5 MG tablet TAKE ONE TABLET BY MOUTH ONCE DAILY 30 tablet 6  . carvedilol (COREG) 6.25 MG tablet TAKE ONE TABLET BY MOUTH TWICE DAILY 60 tablet 6  . furosemide (LASIX) 40 MG tablet Take 1 tablet (40 mg total) by mouth daily. 30 tablet 6  . magnesium oxide (MAG-OX) 400 MG tablet Take 400 mg by mouth daily.    Marland Kitchen omeprazole (PRILOSEC) 20 MG capsule Take 1 capsule (20 mg total) by mouth 2 (two) times daily. 60 capsule 6  . potassium chloride SA (K-DUR,KLOR-CON) 20 MEQ tablet TAKE ONE TABLET BY MOUTH DAILY 30 tablet 6  . sotalol (BETAPACE) 120 MG tablet TAKE ONE TABLET BY MOUTH EVERY TWELVE HOURS. 60 tablet 6   No current facility-administered medications for this visit.    Physical Exam: Filed Vitals:   04/17/15 1138  BP: 104/72  Pulse: 60  Height:  (1.778 m)  Weight: 115.667 kg (255 lb)    GEN- The patient is well appearing, alert and oriented x 3 today.   Head- normocephalic, atraumatic Eyes-  Sclera clear, conjunctiva pink Ears- hearing intact Oropharynx- clear Lungs- Clear to ausculation bilaterally, normal work of breathing Chest- ICD  pocket is well healed Heart- Regular rate and rhythm, no murmurs, rubs or gallops, PMI not laterally displaced GI- soft, NT, ND, + BS Extremities- no clubbing, cyanosis, or edema  ICD interrogation- reviewed in detail today,  See PACEART report ekg today reveals sinus rhythm 71bpm, nonspecific St/T changes, Qtc 425  Assessment and Plan:  1.  Chronic systolic dysfunction euvolemic today Stable on an appropriate medical regimen No changes today Daily weights and 2 gram sodium restriction advised  2. VT Controlled with sotalol qtc is stable Labs 3/16 reviewed today Normal ICD function See Pace Art report No changes  today  merlin Return to see me in 12 months Follow-up with Adam Myrtis Ser as scheduled

## 2015-04-20 ENCOUNTER — Other Ambulatory Visit: Payer: Self-pay | Admitting: Cardiology

## 2015-05-18 ENCOUNTER — Ambulatory Visit (INDEPENDENT_AMBULATORY_CARE_PROVIDER_SITE_OTHER): Payer: Commercial Managed Care - HMO | Admitting: *Deleted

## 2015-05-18 DIAGNOSIS — I5022 Chronic systolic (congestive) heart failure: Secondary | ICD-10-CM

## 2015-05-21 ENCOUNTER — Encounter: Payer: Self-pay | Admitting: *Deleted

## 2015-05-21 ENCOUNTER — Telehealth: Payer: Self-pay | Admitting: *Deleted

## 2015-05-21 NOTE — Telephone Encounter (Signed)
Follow Up ° °Pt returned call/sr  °

## 2015-05-21 NOTE — Telephone Encounter (Signed)
ICM transmission received. I left a message for the patient to call at his home and cell #'s. 

## 2015-05-21 NOTE — Telephone Encounter (Signed)
I spoke with the patient. 

## 2015-05-21 NOTE — Progress Notes (Signed)
EPIC Encounter for ICM Monitoring  Patient Name: Adam [name] is a 49 y.o. male Date: 05/21/2015 Primary Care Physican: Kirstie Peri, MD Primary Cardiologist: Myrtis Ser Electrophysiologist: Allred Dry Weight: 250 lbs       In the past month, have you:  1. Gained more than 2 pounds in a day or more than 5 pounds in a week? Yes. The patient reports that he will notice some flucuation in his weights. If he gets up to 252-253 lbs, then he feels SOB.   2. Had changes in your medications (with verification of current medications)? Yes. The patient takes lasix 40 mg once daily, but reports that he when he has trouble with his weight going up and he gets SOB, he will take an extra 1/2 tablet (20 mg) in the PM. He does this about 2-3 x weekly.  3. Had more shortness of breath than is usual for you? He will have intermittent SOB. He does report having to prop up on an extra pillow about 2-3 x per week. This usually correlates with his weight gain.  4. Limited your activity because of shortness of breath? no  5. Not been able to sleep because of shortness of breath? The patient reports today that he be awoken at night by his SOB.  6. Had increased swelling in your feet or ankles? no  7. Had symptoms of dehydration (dizziness, dry mouth, increased thirst, decreased urine output) no  8. Had changes in sodium restriction? no  9. Been compliant with medication? Yes   ICM trend:   Follow-up plan: ICM clinic phone appointment: 06/22/15. The patient's corvue readings appear to be running at baseline for the patient despite intermittent 2-3 lb weight gain several times a week. The patient is using an extra 20 mg of lasix PRN. He is also concerned that he may have sleep apnea. He reports snoring and his family reports that he will quit breathing for brief periods at night. He is requesting sleep study and states that Dr. Myrtis Ser has spoken with him about this before. I advised the patient I would review with  Dr. Myrtis Ser and see if he will order a sleep study. The patient reports that he is running out of his lasix early since he is using an extra 1/2 tablet 2-3 times every week. I advised I would see if Dr. Myrtis Ser is ok with his PRN dosing of lasix and I will refill his medication after that. He is agreeable.  Copy of note sent to patient's primary care physician, primary cardiologist, and device following physician.  Sherri Rad, RN, BSN 05/21/2015 1:30 PM

## 2015-06-12 ENCOUNTER — Other Ambulatory Visit: Payer: Self-pay | Admitting: Cardiology

## 2015-06-17 DIAGNOSIS — I4891 Unspecified atrial fibrillation: Secondary | ICD-10-CM | POA: Diagnosis not present

## 2015-06-17 DIAGNOSIS — Z1389 Encounter for screening for other disorder: Secondary | ICD-10-CM | POA: Diagnosis not present

## 2015-06-17 DIAGNOSIS — Z6837 Body mass index (BMI) 37.0-37.9, adult: Secondary | ICD-10-CM | POA: Diagnosis not present

## 2015-06-17 DIAGNOSIS — I2510 Atherosclerotic heart disease of native coronary artery without angina pectoris: Secondary | ICD-10-CM | POA: Diagnosis not present

## 2015-06-17 DIAGNOSIS — Z139 Encounter for screening, unspecified: Secondary | ICD-10-CM | POA: Diagnosis not present

## 2015-06-17 DIAGNOSIS — Z418 Encounter for other procedures for purposes other than remedying health state: Secondary | ICD-10-CM | POA: Diagnosis not present

## 2015-06-19 DIAGNOSIS — G4730 Sleep apnea, unspecified: Secondary | ICD-10-CM | POA: Diagnosis not present

## 2015-06-20 DIAGNOSIS — Z794 Long term (current) use of insulin: Secondary | ICD-10-CM | POA: Diagnosis not present

## 2015-06-20 DIAGNOSIS — M25522 Pain in left elbow: Secondary | ICD-10-CM | POA: Diagnosis not present

## 2015-06-20 DIAGNOSIS — M109 Gout, unspecified: Secondary | ICD-10-CM | POA: Diagnosis not present

## 2015-06-20 DIAGNOSIS — Z95810 Presence of automatic (implantable) cardiac defibrillator: Secondary | ICD-10-CM | POA: Diagnosis not present

## 2015-06-20 DIAGNOSIS — N182 Chronic kidney disease, stage 2 (mild): Secondary | ICD-10-CM | POA: Diagnosis not present

## 2015-06-20 DIAGNOSIS — Z79899 Other long term (current) drug therapy: Secondary | ICD-10-CM | POA: Diagnosis not present

## 2015-06-22 ENCOUNTER — Ambulatory Visit (INDEPENDENT_AMBULATORY_CARE_PROVIDER_SITE_OTHER): Payer: Commercial Managed Care - HMO | Admitting: *Deleted

## 2015-06-22 DIAGNOSIS — Z95810 Presence of automatic (implantable) cardiac defibrillator: Secondary | ICD-10-CM

## 2015-06-22 DIAGNOSIS — I5022 Chronic systolic (congestive) heart failure: Secondary | ICD-10-CM

## 2015-06-24 DIAGNOSIS — Z6837 Body mass index (BMI) 37.0-37.9, adult: Secondary | ICD-10-CM | POA: Diagnosis not present

## 2015-06-24 DIAGNOSIS — Z789 Other specified health status: Secondary | ICD-10-CM | POA: Diagnosis not present

## 2015-06-24 DIAGNOSIS — M109 Gout, unspecified: Secondary | ICD-10-CM | POA: Diagnosis not present

## 2015-06-24 DIAGNOSIS — N183 Chronic kidney disease, stage 3 (moderate): Secondary | ICD-10-CM | POA: Diagnosis not present

## 2015-06-24 DIAGNOSIS — R7309 Other abnormal glucose: Secondary | ICD-10-CM | POA: Diagnosis not present

## 2015-06-24 DIAGNOSIS — M7712 Lateral epicondylitis, left elbow: Secondary | ICD-10-CM | POA: Diagnosis not present

## 2015-06-24 DIAGNOSIS — Z418 Encounter for other procedures for purposes other than remedying health state: Secondary | ICD-10-CM | POA: Diagnosis not present

## 2015-06-25 ENCOUNTER — Telehealth: Payer: Self-pay | Admitting: Internal Medicine

## 2015-06-25 ENCOUNTER — Telehealth: Payer: Self-pay

## 2015-06-25 DIAGNOSIS — I472 Ventricular tachycardia, unspecified: Secondary | ICD-10-CM

## 2015-06-25 MED ORDER — SOTALOL HCL 120 MG PO TABS: ORAL_TABLET | ORAL | Status: DC

## 2015-06-25 NOTE — Telephone Encounter (Signed)
Encounter open in error 

## 2015-06-25 NOTE — Telephone Encounter (Signed)
ICM call made to patient today and requesting refill on Sotalol.  Last potassium documented in March in [place].  Called Dr Margaretmary Eddy office today and BMET was drawn 06/24/2015 and waiting on results.  Last magnesium documented July 2014.  Patient reported he will be out of Sotalol by the weekend.  Will call Dr Sherryll Burger, PCP office back later today for results.

## 2015-06-25 NOTE — Telephone Encounter (Signed)
Discussed med order with Tresa Endo, Dr Jenel Lucks nurse.  She advised to order 30 day supply of Sotalol and schedule labs for Magnesium and Potassium levels.  Appointment with Dr Myrtis Ser on 07/08/2015 and patient will need BMET and Mg at that visit.   Patient notified.

## 2015-06-25 NOTE — Telephone Encounter (Signed)
Call back to Dr Margaretmary Eddy office to obtain results of BMET and spoke with Elon Jester.  She reported that Dr Sherryll Burger ordered the labs but per lab clinic, patient did not have the labs drawn.  Will forward to Dr Johney Frame and his nurse Tresa Endo for review.

## 2015-06-25 NOTE — Telephone Encounter (Signed)
Spoke with patient.

## 2015-06-25 NOTE — Progress Notes (Signed)
EPIC Encounter for ICM Monitoring  Patient Name: Adam [name] is a 49 y.o. male Date: 06/25/2015 Primary Care Physican: Kirstie Peri, MD Primary Cardiologist: Myrtis Ser Electrophysiologist: Allred Dry Weight: 255 lbs       In the past month, have you:  1. Gained more than 2 pounds in a day or more than 5 pounds in a week? Yes, increase in the last week due to recent gout exacerbation.  Education given to weigh daily and should the weight continue to increase > 255 lbs to call.    2. Had changes in your medications (with verification of current medications)? no  3. Had more shortness of breath than is usual for you? Yes, last week due to Gout exacerbation and wt gain.   4. Limited your activity because of shortness of breath? no  5. Not been able to sleep because of shortness of breath? no  6. Had increased swelling in your feet or ankles? no  7. Had symptoms of dehydration (dizziness, dry mouth, increased thirst, decreased urine output) no  8. Had changes in sodium restriction? no  9. Been compliant with medication? Yes   ICM trend:   Follow-up plan: ICM clinic phone appointment 07/28/2015.  Patient reported he has had fluid increase due to gout issues.  He visited ER last weekend for gout of foot and now has it in his elbow. He reported he had been trying to eat salads, fish and other healthier food but it caused a gout flare.  He followed up with PCP, Dr Sherryll Burger, 06/24/2015 and prescribed oral steriods x 6 days but did not have the dosage information.  Education given steroids can increase wt gain.  He has another follow up appointment with Dr Sherryll Burger on 07/08/2015.  He has had some shortness of breath last week but that has resolved.  Continues to weigh.  Reviewed HF symptoms to report.  He is out of refills for Sotalol and will run out of the medication this weekend.  Advised will forward message to Dr Johney Frame regarding request for Sotalol refill.   Patient reported he is walking 2-3 miles a  day a few times a week.  Copy of note sent to patient's primary care physician, primary cardiologist, and device following physician.  Karie Soda, RN, CCM 06/25/2015 11:21 AM

## 2015-06-25 NOTE — Telephone Encounter (Signed)
ICM transmission received.  Call to patient at home.  Person answering phone stated to call on his cell phone in about 30 minutes.

## 2015-06-26 DIAGNOSIS — E780 Pure hypercholesterolemia: Secondary | ICD-10-CM | POA: Diagnosis not present

## 2015-06-26 DIAGNOSIS — R5383 Other fatigue: Secondary | ICD-10-CM | POA: Diagnosis not present

## 2015-06-26 DIAGNOSIS — M7712 Lateral epicondylitis, left elbow: Secondary | ICD-10-CM | POA: Diagnosis not present

## 2015-06-30 DIAGNOSIS — G4730 Sleep apnea, unspecified: Secondary | ICD-10-CM | POA: Diagnosis not present

## 2015-07-06 ENCOUNTER — Other Ambulatory Visit: Payer: Self-pay | Admitting: Cardiology

## 2015-07-08 ENCOUNTER — Encounter: Payer: Self-pay | Admitting: Cardiology

## 2015-07-08 ENCOUNTER — Ambulatory Visit (INDEPENDENT_AMBULATORY_CARE_PROVIDER_SITE_OTHER): Payer: Commercial Managed Care - HMO | Admitting: Cardiology

## 2015-07-08 VITALS — BP 104/72 | HR 58 | Ht 71.0 in | Wt 251.8 lb

## 2015-07-08 DIAGNOSIS — I5022 Chronic systolic (congestive) heart failure: Secondary | ICD-10-CM | POA: Diagnosis not present

## 2015-07-08 DIAGNOSIS — M25522 Pain in left elbow: Secondary | ICD-10-CM | POA: Diagnosis not present

## 2015-07-08 DIAGNOSIS — N289 Disorder of kidney and ureter, unspecified: Secondary | ICD-10-CM | POA: Diagnosis not present

## 2015-07-08 DIAGNOSIS — I472 Ventricular tachycardia, unspecified: Secondary | ICD-10-CM

## 2015-07-08 DIAGNOSIS — Z95810 Presence of automatic (implantable) cardiac defibrillator: Secondary | ICD-10-CM | POA: Diagnosis not present

## 2015-07-08 DIAGNOSIS — I429 Cardiomyopathy, unspecified: Secondary | ICD-10-CM

## 2015-07-08 MED ORDER — FUROSEMIDE 40 MG PO TABS: ORAL_TABLET | ORAL | Status: DC

## 2015-07-08 NOTE — Assessment & Plan Note (Signed)
Volume status is stable. No change in therapy. 

## 2015-07-08 NOTE — Patient Instructions (Signed)
Your physician has recommended you make the following change in your medication:  Increase your furosemide to 40 mg in the morning and 20 mg in the evening and this is the way you have already been taking it. Continue all other medications the same. Your physician recommends that you schedule a follow-up appointment in: 6 months with Dr. Diona Browner. You will receive a reminder letter in the mail in about 4 months reminding you to call and schedule your appointment. If you don't receive this letter, please contact our office.

## 2015-07-08 NOTE — Assessment & Plan Note (Signed)
His creatinine ranges around 1.5. No change in therapy.

## 2015-07-08 NOTE — Assessment & Plan Note (Signed)
His ICD is overseen by Dr. Johney Frame. It is working well. No further workup.

## 2015-07-08 NOTE — Assessment & Plan Note (Signed)
He has a nonischemic cardiomyopathy. He is on appropriate medications. No change in therapy.

## 2015-07-08 NOTE — Progress Notes (Signed)
Cardiology Office Note   Date:  07/08/2015   ID:  Adam [name], DOB 1966/07/28, MRN [id number]  PCP:  Kirstie Peri, MD  Cardiologist:  Willa Rough, MD   Chief Complaint  Patient presents with  . Appointment    Follow-up CHF      History of Present Illness: Adam [name] is a 49 y.o. male who presents today to follow-up CHF and cardiomyopathy. He is doing well. He follows carefully with Dr. Johney Frame. He saw him last April 23, 2015. There were no changes in his meds. He is not having any chest pain or shortness of breath. Fortunately he has not had any recurrent ventricular tachycardia.     Past Medical History  Diagnosis Date  . Mitral regurgitation   . NICM (nonischemic cardiomyopathy)     Nonischemic / catheterization October, 2011, normal coronary arteries  . Chronic systolic heart failure   . Ejection fraction < 50%     Echo 06/05/12: Mild LVH, EF 20%, posterior severe HK, anterolateral severe HK, anterior severe HK, mid to apical septal AK, AK of the true apex, apical inferior HK, restrictive physiology, trivial MR, moderate LAE, mildly reduced RV function, mild TR.  . ICD (implantable cardiac defibrillator) battery depletion 11/11/10    St. Jude, Dr.Allred  . Renal insufficiency     Creatinine 1.5 (GFR greater than 50)  . Headache(784.0)     Patient seen to have headaches from spironolactone.  Drug was stopped, August, 2012  . Sleepiness     has some sleepiness during the day  . 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 VT (ventricular tachycardia)     VT storm 7/13 - Sotalol started  . Drug therapy     Sotalol started for VT storm  July, 2013  . Hypokalemia     There was some decrease in potassium with his admission in July, 2013. We will watch this very carefully.  . Disability examination     Discussion of disability September, 2013    Past Surgical History  Procedure Laterality Date  . Tee with cardioversion  11/17/08    No LV clot  . Cardiac  defibrillator placement  11/11/10    SJM Fortify DR ICD implanted by Dr Johney Frame    Patient Active Problem List   Diagnosis Date Noted  . Lightheadedness 01/24/2014  . Nonischemic cardiomyopathy 10/29/2013  . Chronic systolic CHF (congestive heart failure) 04/26/2013  . Chest discomfort 04/26/2013  . Bradycardia 04/26/2013  . Automatic implantable cardioverter-defibrillator in situ 09/04/2012  . Hypokalemia   . Disability examination   . Drug therapy   . Ventricular tachyarrhythmia 06/06/2012  . Headache(784.0)   . Sleepiness   . Mitral regurgitation   . Cardiomyopathy   . Ejection fraction < 50%   . Renal insufficiency       Current Outpatient Prescriptions  Medication Sig Dispense Refill  . aspirin EC 81 MG tablet Take 81 mg by mouth daily.    . benazepril (LOTENSIN) 5 MG tablet TAKE ONE TABLET BY MOUTH ONCE DAILY 30 tablet 6  . carvedilol (COREG) 6.25 MG tablet TAKE ONE TABLET BY MOUTH TWICE DAILY 60 tablet 6  . furosemide (LASIX) 40 MG tablet TAKE ONE TABLET BY MOUTH DAILY (Patient taking differently: TAKE ONE TABLET BY MOUTH every morning & 1/2 tab at bedtime) 30 tablet 6  . magnesium oxide (MAG-OX) 400 MG tablet Take 400 mg by mouth daily.    Marland Kitchen omeprazole (PRILOSEC) 20 MG capsule Take 1  capsule (20 mg total) by mouth 2 (two) times daily. 60 capsule 6  . potassium chloride SA (K-DUR,KLOR-CON) 20 MEQ tablet Take 10 mEq by mouth 2 (two) times daily.    . sotalol (BETAPACE) 120 MG tablet TAKE ONE TABLET BY MOUTH EVERY TWELVE HOURS. 60 tablet 0   No current facility-administered medications for this visit.    Allergies:   Atorvastatin    Social History:  The patient  reports that he has never smoked. He has never used smokeless tobacco. He reports that he drinks alcohol. He reports that he does not use illicit drugs.   Family History:  The patient's family history includes Heart attack in his brother and paternal grandfather; Heart attack (age of onset: 51) in his father;  Heart failure in his brother; Hypertension in his mother.    ROS:  Please see the history of present illness.     Patient denies fever, chills, headache, sweats, rash, change in vision, change in hearing, chest pain, cough, nausea or vomiting, urinary symptoms. All other systems are reviewed and are negative.   PHYSICAL EXAM: VS:  BP 104/72 mmHg  Pulse 58  Ht 5\' 11"  (1.803 m)  Wt 251 lb 12.8 oz (114.216 kg)  BMI 35.13 kg/m2  SpO2 97% , Patient is oriented to person time and place. Affect is normal. He is overweight. Head is atraumatic. Sclera and conjunctiva are normal. There is no jugular venous distention. Lungs are clear. Respiratory effort is not labored. Cardiac exam reveals S1 and S2. Abdomen is soft. There is no peripheral edema. There are no musculoskeletal deformities. There are no skin rashes.  EKG:   EKG is not done today.  Recent Labs: No results found for requested labs within last 365 days.    Lipid Panel No results found for: CHOL, TRIG, HDL, CHOLHDL, VLDL, LDLCALC, LDLDIRECT    Wt Readings from Last 3 Encounters:  07/08/15 251 lb 12.8 oz (114.216 kg)  04/17/15 255 lb (115.667 kg)  01/08/15 256 lb (116.121 kg)      Current medicines are reviewed  The patient understands his medications.   ASSESSMENT AND PLAN:

## 2015-07-08 NOTE — Assessment & Plan Note (Signed)
The patient had VT storm in July, 2013. He has been treated with sotalol since then. He is stable.

## 2015-07-28 ENCOUNTER — Telehealth: Payer: Self-pay | Admitting: Cardiology

## 2015-07-28 ENCOUNTER — Ambulatory Visit (INDEPENDENT_AMBULATORY_CARE_PROVIDER_SITE_OTHER): Payer: Commercial Managed Care - HMO | Admitting: *Deleted

## 2015-07-28 DIAGNOSIS — Z95810 Presence of automatic (implantable) cardiac defibrillator: Secondary | ICD-10-CM

## 2015-07-28 DIAGNOSIS — I5022 Chronic systolic (congestive) heart failure: Secondary | ICD-10-CM | POA: Diagnosis not present

## 2015-07-28 DIAGNOSIS — I429 Cardiomyopathy, unspecified: Secondary | ICD-10-CM

## 2015-07-28 NOTE — Telephone Encounter (Signed)
Confirmed remote transmission w/ pt roommate.   

## 2015-07-29 NOTE — Progress Notes (Signed)
EPIC Encounter for ICM Monitoring  Patient Name: Adam [name] is a 49 y.o. male Date: 07/29/2015 Primary Care Physican: Kirstie Peri, MD Primary Cardiologist: Myrtis Ser Electrophysiologist: Allred Dry Weight: 251 lbs       In the past month, have you:  1. Gained more than 2 pounds in a day or more than 5 pounds in a week? No, but did have a 9 lb weight gain since 07/08/2015 but returned to his dry weight since increase in Lasix on 07/08/2015.  2. Had changes in your medications (with verification of current medications)? Yes, Lasix 40mg  1 tablet every morning and 1/2 tablet in the evening.  3. Had more shortness of breath than is usual for you? Yes from 07/09/2015 to 07/23/2015 but has resolved.  4. Limited your activity because of shortness of breath? no  5. Not been able to sleep because of shortness of breath? Yes, during  07/09/2015 to 07/23/2015  6. Had increased swelling in your feet or ankles? no  7. Had symptoms of dehydration (dizziness, dry mouth, increased thirst, decreased urine output) no  8. Had changes in sodium restriction? Yes on 07/08/2015 but resolved  9. Been compliant with medication? Yes   ICM trend:   Follow-up plan: ICM clinic phone appointment on 08/31/2015.  Impedance below baseline 07/09/2015 to 07/26/2015 and he reported he was being treated for gout with steroids.  He had HF symptoms, weight gain, SOB, increase in number of pillows from 2 to 4 at night.  He visited Dr Myrtis Ser on 07/08/2015 and his ankles were swollen and Lasix was increased.  Symptoms have resolved and impedance returned back to baseline. No changes today.  Copy of note sent to patient's primary care physician, primary cardiologist, and device following physician.  Karie Soda, RN, CCM 07/29/2015 10:58 AM

## 2015-08-03 LAB — CUP PACEART REMOTE DEVICE CHECK
Battery Remaining Percentage: 53 %
Brady Statistic AP VP Percent: 1 %
Brady Statistic AP VS Percent: 1 %
Brady Statistic AS VP Percent: 1 %
Brady Statistic AS VS Percent: 98 %
Brady Statistic RV Percent Paced: 1 %
Date Time Interrogation Session: 20160920213724
HIGH POWER IMPEDANCE MEASURED VALUE: 68 Ohm
HighPow Impedance: 68 Ohm
Lead Channel Impedance Value: 440 Ohm
Lead Channel Pacing Threshold Amplitude: 0.75 V
Lead Channel Sensing Intrinsic Amplitude: 4.7 mV
Lead Channel Setting Pacing Amplitude: 2 V
Lead Channel Setting Pacing Pulse Width: 0.4 ms
MDC IDC MSMT BATTERY REMAINING LONGEVITY: 62 mo
MDC IDC MSMT BATTERY VOLTAGE: 2.93 V
MDC IDC MSMT LEADCHNL RA IMPEDANCE VALUE: 440 Ohm
MDC IDC MSMT LEADCHNL RA PACING THRESHOLD PULSEWIDTH: 0.4 ms
MDC IDC MSMT LEADCHNL RV PACING THRESHOLD AMPLITUDE: 0.75 V
MDC IDC MSMT LEADCHNL RV PACING THRESHOLD PULSEWIDTH: 0.4 ms
MDC IDC MSMT LEADCHNL RV SENSING INTR AMPL: 11.2 mV
MDC IDC SET LEADCHNL RV PACING AMPLITUDE: 2.5 V
MDC IDC SET LEADCHNL RV SENSING SENSITIVITY: 0.5 mV
MDC IDC SET ZONE DETECTION INTERVAL: 335 ms
MDC IDC STAT BRADY RA PERCENT PACED: 1 %
Pulse Gen Serial Number: 622431
Zone Setting Detection Interval: 270 ms

## 2015-08-07 ENCOUNTER — Encounter: Payer: Self-pay | Admitting: Cardiology

## 2015-08-10 ENCOUNTER — Other Ambulatory Visit: Payer: Self-pay | Admitting: Internal Medicine

## 2015-08-19 ENCOUNTER — Encounter: Payer: Self-pay | Admitting: Internal Medicine

## 2015-08-31 ENCOUNTER — Ambulatory Visit (INDEPENDENT_AMBULATORY_CARE_PROVIDER_SITE_OTHER): Payer: Commercial Managed Care - HMO | Admitting: *Deleted

## 2015-08-31 DIAGNOSIS — I5022 Chronic systolic (congestive) heart failure: Secondary | ICD-10-CM | POA: Diagnosis not present

## 2015-08-31 DIAGNOSIS — Z95810 Presence of automatic (implantable) cardiac defibrillator: Secondary | ICD-10-CM | POA: Diagnosis not present

## 2015-08-31 NOTE — Progress Notes (Signed)
EPIC Encounter for ICM Monitoring  Patient Name: Adam [name] is a 49 y.o. male Date: 08/31/2015 Primary Care Physican: Kirstie Peri, MD Primary Cardiologist: Myrtis Ser Electrophysiologist: Allred Dry Weight: 251 lb       In the past month, have you:  1. Gained more than 2 pounds in a day or more than 5 pounds in a week? no  2. Had changes in your medications (with verification of current medications)? no  3. Had more shortness of breath than is usual for you? no  4. Limited your activity because of shortness of breath? no  5. Not been able to sleep because of shortness of breath? no  6. Had increased swelling in your feet or ankles? no  7. Had symptoms of dehydration (dizziness, dry mouth, increased thirst, decreased urine output) no  8. Had changes in sodium restriction? no  9. Been compliant with medication? Yes   ICM trend: 08/31/2015    Follow-up plan: ICM clinic phone appointment 10/05/2015. Corvue impedance trending close to baseline.  Patient reported he is doing well since the fluid pill was increased in August.  No changes today. Copy of note sent to patient's primary care physician, primary cardiologist, and device following physician.  Karie Soda, RN, CCM 08/31/2015 3:59 PM

## 2015-10-05 ENCOUNTER — Ambulatory Visit (INDEPENDENT_AMBULATORY_CARE_PROVIDER_SITE_OTHER): Payer: Commercial Managed Care - HMO

## 2015-10-05 DIAGNOSIS — I5022 Chronic systolic (congestive) heart failure: Secondary | ICD-10-CM

## 2015-10-05 DIAGNOSIS — Z95810 Presence of automatic (implantable) cardiac defibrillator: Secondary | ICD-10-CM

## 2015-10-06 NOTE — Progress Notes (Signed)
EPIC Encounter for ICM Monitoring  Patient Name: Adam [name] is a 49 y.o. male Date: 10/06/2015 Primary Care Physican: Kirstie Peri, MD Primary Cardiologist: Myrtis Ser Electrophysiologist: Allred Dry Weight: 251 lb       In the past month, have you:  1. Gained more than 2 pounds in a day or more than 5 pounds in a week? no  2. Had changes in your medications (with verification of current medications)? no  3. Had more shortness of breath than is usual for you? no  4. Limited your activity because of shortness of breath? no  5. Not been able to sleep because of shortness of breath? no  6. Had increased swelling in your feet or ankles? no  7. Had symptoms of dehydration (dizziness, dry mouth, increased thirst, decreased urine output) no  8. Had changes in sodium restriction? no  9. Been compliant with medication? Yes   ICM trend:  10/05/2015   Follow-up plan: ICM clinic phone appointment on 11/10/2015.  Corvue daily impedance below baseline  ~09/28/2015 to 10/01/2015 and 10/04/2015 to 10/05/2015 suggesting fluid retention.  He reported he ate differently due to the holiday. Advised to resume low sodium diet and limit fluid intake to approximately 64 oz daily.  No changes today.  Copy of note sent to patient's primary care physician, primary cardiologist, and device following physician.  Karie Soda, RN, CCM 10/06/2015 1:17 PM

## 2015-10-07 DIAGNOSIS — M109 Gout, unspecified: Secondary | ICD-10-CM | POA: Diagnosis not present

## 2015-10-07 DIAGNOSIS — G4730 Sleep apnea, unspecified: Secondary | ICD-10-CM | POA: Diagnosis not present

## 2015-10-09 DIAGNOSIS — M109 Gout, unspecified: Secondary | ICD-10-CM | POA: Diagnosis not present

## 2015-10-13 ENCOUNTER — Telehealth: Payer: Self-pay | Admitting: Cardiology

## 2015-10-13 NOTE — Telephone Encounter (Signed)
This is a former patient of Dr. Ron Parker that I have not yet met. As of office visit in August, he was described as being clinically stable with history of nonischemic cardiomyopathy. The presence of stable cardiac disease is not an adequate excuse to be dismissed from jury duty.

## 2015-10-13 NOTE — Telephone Encounter (Signed)
Spoke with pt who is requesting to be dismissed from jury duty due to hx of heart failure and gout. Denies any cardiac related symptoms other than fatigue. Explained that we could not promise MD would excuse this. Will forward to Dr. Diona Browner

## 2015-10-13 NOTE — Telephone Encounter (Signed)
Patient would like a letter stating that he can not do [employer], Letter given to [employer]

## 2015-10-13 NOTE — Telephone Encounter (Signed)
Pt made aware, did not need form back since it was a copy of original.

## 2015-10-20 NOTE — Telephone Encounter (Signed)
Fiance called back r/e request and was informed that patient was notified last week by [facility] that this request was denied. Fiance verbalized understanding.

## 2015-10-29 DIAGNOSIS — M109 Gout, unspecified: Secondary | ICD-10-CM | POA: Diagnosis not present

## 2015-10-29 DIAGNOSIS — I340 Nonrheumatic mitral (valve) insufficiency: Secondary | ICD-10-CM | POA: Diagnosis not present

## 2015-10-29 DIAGNOSIS — M549 Dorsalgia, unspecified: Secondary | ICD-10-CM | POA: Diagnosis not present

## 2015-11-04 ENCOUNTER — Telehealth: Payer: Self-pay | Admitting: Internal Medicine

## 2015-11-04 NOTE — Telephone Encounter (Signed)
Spoke to patient about recommendations for STJ recall. I informed patient that the general recommendation is to have patients followed via Merlin and for them to be aware of their vibratory alert. I told him that if there are any other patient specific recommendations they would be discussed at his upcoming appt on 11/11/15. Patient voiced understanding.   800# for [employer] support was also given for troubleshooting Merlin monitor.

## 2015-11-04 NOTE — Telephone Encounter (Signed)
New problem   Pt need to speak to someone concerning a recall on his St Jude. Please call pt.

## 2015-11-04 NOTE — Telephone Encounter (Signed)
LMTCB//sss 

## 2015-11-10 NOTE — Progress Notes (Signed)
ICM transmission date rescheduled from 11/10/2015 to 12/14/2015 since he has an office appointment with Dr Johney Frame on 11/10/2015.

## 2015-11-11 ENCOUNTER — Other Ambulatory Visit: Payer: Self-pay

## 2015-11-11 ENCOUNTER — Ambulatory Visit (INDEPENDENT_AMBULATORY_CARE_PROVIDER_SITE_OTHER): Payer: Medicare HMO | Admitting: Internal Medicine

## 2015-11-11 ENCOUNTER — Other Ambulatory Visit: Payer: Medicare HMO

## 2015-11-11 ENCOUNTER — Encounter: Payer: Self-pay | Admitting: Internal Medicine

## 2015-11-11 VITALS — BP 112/80 | HR 67 | Ht 70.5 in | Wt 249.6 lb

## 2015-11-11 DIAGNOSIS — I472 Ventricular tachycardia, unspecified: Secondary | ICD-10-CM

## 2015-11-11 DIAGNOSIS — I5022 Chronic systolic (congestive) heart failure: Secondary | ICD-10-CM | POA: Diagnosis not present

## 2015-11-11 DIAGNOSIS — Z95810 Presence of automatic (implantable) cardiac defibrillator: Secondary | ICD-10-CM

## 2015-11-11 DIAGNOSIS — I429 Cardiomyopathy, unspecified: Secondary | ICD-10-CM

## 2015-11-11 LAB — CUP PACEART INCLINIC DEVICE CHECK
Battery Remaining Longevity: 61.2
Brady Statistic RA Percent Paced: 1.4 %
Brady Statistic RV Percent Paced: 0.01 %
HIGH POWER IMPEDANCE MEASURED VALUE: 67.5 Ohm
Implantable Lead Location: 753860
Implantable Lead Model: 7122
Lead Channel Impedance Value: 437.5 Ohm
Lead Channel Pacing Threshold Amplitude: 1 V
Lead Channel Pacing Threshold Pulse Width: 0.4 ms
Lead Channel Pacing Threshold Pulse Width: 0.4 ms
Lead Channel Pacing Threshold Pulse Width: 0.4 ms
Lead Channel Sensing Intrinsic Amplitude: 4.3 mV
Lead Channel Setting Sensing Sensitivity: 0.5 mV
MDC IDC LEAD IMPLANT DT: 20120105
MDC IDC LEAD IMPLANT DT: 20120105
MDC IDC LEAD LOCATION: 753859
MDC IDC MSMT LEADCHNL RA IMPEDANCE VALUE: 400 Ohm
MDC IDC MSMT LEADCHNL RA PACING THRESHOLD AMPLITUDE: 1 V
MDC IDC MSMT LEADCHNL RV PACING THRESHOLD AMPLITUDE: 0.75 V
MDC IDC MSMT LEADCHNL RV SENSING INTR AMPL: 11.2 mV
MDC IDC SESS DTM: 20170104131328
MDC IDC SET LEADCHNL RA PACING AMPLITUDE: 2 V
MDC IDC SET LEADCHNL RV PACING AMPLITUDE: 2.5 V
MDC IDC SET LEADCHNL RV PACING PULSEWIDTH: 0.4 ms
Pulse Gen Serial Number: 622431

## 2015-11-11 LAB — BASIC METABOLIC PANEL
BUN: 14 mg/dL (ref 7–25)
CO2: 26 mmol/L (ref 20–31)
CREATININE: 1.28 mg/dL (ref 0.70–1.33)
Calcium: 8.9 mg/dL (ref 8.6–10.3)
Chloride: 102 mmol/L (ref 98–110)
Glucose, Bld: 110 mg/dL — ABNORMAL HIGH (ref 65–99)
POTASSIUM: 3.6 mmol/L (ref 3.5–5.3)
Sodium: 140 mmol/L (ref 135–146)

## 2015-11-11 LAB — CBC WITH DIFFERENTIAL/PLATELET
BASOS ABS: 0 10*3/uL (ref 0.0–0.1)
Basophils Relative: 0 % (ref 0–1)
EOS ABS: 0.1 10*3/uL (ref 0.0–0.7)
EOS PCT: 2 % (ref 0–5)
HCT: 42.3 % (ref 39.0–52.0)
Hemoglobin: 14.4 g/dL (ref 13.0–17.0)
Lymphocytes Relative: 27 % (ref 12–46)
Lymphs Abs: 1.6 10*3/uL (ref 0.7–4.0)
MCH: 30.5 pg (ref 26.0–34.0)
MCHC: 34 g/dL (ref 30.0–36.0)
MCV: 89.6 fL (ref 78.0–100.0)
MPV: 12.2 fL (ref 8.6–12.4)
Monocytes Absolute: 0.7 10*3/uL (ref 0.1–1.0)
Monocytes Relative: 11 % (ref 3–12)
NEUTROS PCT: 60 % (ref 43–77)
Neutro Abs: 3.6 10*3/uL (ref 1.7–7.7)
PLATELETS: 215 10*3/uL (ref 150–400)
RBC: 4.72 MIL/uL (ref 4.22–5.81)
RDW: 15.5 % (ref 11.5–15.5)
WBC: 6 10*3/uL (ref 4.0–10.5)

## 2015-11-11 NOTE — Patient Instructions (Addendum)
   Medication Instructions:  Your physician recommends that you continue on your current medications as directed. Please refer to the Current Medication list given to you today.    Labwork: Your physician recommends that you return for lab work today: BMP/MAG   Testing/Procedures: None ordered   Follow-Up: Remote monitoring is used to monitor your  ICD from home. This monitoring reduces the number of office visits required to check your device to one time per year. It allows [place] to keep an eye on the functioning of your device to ensure it is working properly. You are scheduled for a device check from home on 02/10/16. You may send your transmission at any time that day. If you have a wireless device, the transmission will be sent automatically. After your physician reviews your transmission, you will receive a postcard with your next transmission date.   Your physician wants you to follow-up in: 12 months with Dr Johney Frame in [place] You will receive a reminder letter in the mail two months in advance. If you don't receive a letter, please call our office to schedule the follow-up appointment.     Any Other Special Instructions Will Be Listed Below (If Applicable).     If you need a refill on your cardiac medications before your next appointment, please call your pharmacy.

## 2015-11-11 NOTE — Progress Notes (Signed)
PCP: Kirstie Peri, MD Primary Cardiologist: previously Dr Myrtis Ser, plans to establish with Carlena Hurl Boulanger is a 50 y.o. male who presents today for routine electrophysiology followup.  Presently, he is doing very well.   He denies any further episodes of symptomatic VT or ICD therapies since last visit.     Today, he denies symptoms of palpitations, chest pain, lower extremity edema, dizziness, presyncope, syncope, or ICD shocks.  The patient is otherwise without complaint today.   Past Medical History  Diagnosis Date  . Mitral regurgitation   . NICM (nonischemic cardiomyopathy) (HCC)     Nonischemic / catheterization October, 2011, normal coronary arteries  . Chronic systolic heart failure (HCC)   . Ejection fraction < 50%     Echo 06/05/12: Mild LVH, EF 20%, posterior severe HK, anterolateral severe HK, anterior severe HK, mid to apical septal AK, AK of the true apex, apical inferior HK, restrictive physiology, trivial MR, moderate LAE, mildly reduced RV function, mild TR.  . ICD (implantable cardiac defibrillator) battery depletion 11/11/10    St. Jude, Dr.Bula Cavalieri  . Renal insufficiency     Creatinine 1.5 (GFR greater than 50)  . Headache(784.0)     Patient seen to have headaches from spironolactone.  Drug was stopped, August, 2012  . Sleepiness     has some sleepiness during the day  . 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 VT (ventricular tachycardia) (HCC)     VT storm 7/13 - Sotalol started  . Drug therapy     Sotalol started for VT storm  July, 2013  . Hypokalemia     There was some decrease in potassium with his admission in July, 2013. We will watch this very carefully.  . Disability examination     Discussion of disability September, 2013   Past Surgical History  Procedure Laterality Date  . Tee with cardioversion  11/17/08    No LV clot  . Cardiac defibrillator placement  11/11/10    SJM Fortify DR ICD implanted by Dr Johney Frame    Current Outpatient  Prescriptions  Medication Sig Dispense Refill  . allopurinol (ZYLOPRIM) 100 MG tablet Take 1 tablet by mouth daily as needed. Gout    . aspirin EC 81 MG tablet Take 81 mg by mouth daily.    . benazepril (LOTENSIN) 5 MG tablet TAKE ONE TABLET BY MOUTH ONCE DAILY 30 tablet 6  . carvedilol (COREG) 6.25 MG tablet TAKE ONE TABLET BY MOUTH TWICE DAILY 60 tablet 6  . diclofenac (VOLTAREN) 75 MG EC tablet Take 1 tablet by mouth daily as needed. Pain    . furosemide (LASIX) 40 MG tablet Take 40 mg by mouth 2 (two) times daily.    . magnesium oxide (MAG-OX) 400 MG tablet Take 400 mg by mouth daily.    Marland Kitchen omeprazole (PRILOSEC) 20 MG capsule Take 1 capsule (20 mg total) by mouth 2 (two) times daily. 60 capsule 6  . potassium chloride SA (K-DUR,KLOR-CON) 20 MEQ tablet Take 10 mEq by mouth 2 (two) times daily.    . sotalol (BETAPACE) 120 MG tablet TAKE ONE TABLET BY MOUTH EVERY TWELVE HOURS. 60 tablet 6   No current facility-administered medications for this visit.    Physical Exam: Filed Vitals:   11/11/15 1228  BP: 112/80  Pulse: 67  Height: 5' 10.5" (1.791 m)  Weight: 249 lb 9.6 oz (113.218 kg)    GEN- The patient is well appearing, alert and oriented x 3 today.  Head- normocephalic, atraumatic Eyes-  Sclera clear, conjunctiva pink Ears- hearing intact Oropharynx- clear Lungs- Clear to ausculation bilaterally, normal work of breathing Chest- ICD pocket is well healed Heart- Regular rate and rhythm, no murmurs, rubs or gallops, PMI not laterally displaced GI- soft, NT, ND, + BS Extremities- no clubbing, cyanosis, or edema  ICD interrogation- reviewed in detail today,  See PACEART report ekg today reveals sinus rhythm 67 bpm, inferior infract, Qtc 441  Assessment and Plan:  1.  Chronic systolic dysfunction euvolemic today Stable on an appropriate medical regimen No changes today Daily weights and 2 gram sodium restriction advised  2. VT Controlled with sotalol qtc is stable Bmet,  mg today Normal ICD function See Pace Art report No changes today I have discussed SJM Fortify Assura advisary with the patient today. He understands that recommendation from SJM is to not replace the device at this time. The patient is not device dependant.  The patient has had appropriate device therapy in the past.  Vibratory alert demonstrated today.  He is actively remotely monitored and understands the importance of compliance today.  We have decided together today to continue surveillance and no consider gen change at this time.   merlin Return to see me in 12 months in [place] Follow-up with Dr Diona Browner as scheduled  Hillis Range MD, [facility] 11/11/2015 12:48 PM

## 2015-11-17 ENCOUNTER — Other Ambulatory Visit: Payer: Self-pay | Admitting: Internal Medicine

## 2015-11-17 MED ORDER — FUROSEMIDE 40 MG PO TABS: 40.0000 mg | ORAL_TABLET | Freq: Two times a day (BID) | ORAL | Status: DC

## 2015-11-17 NOTE — Telephone Encounter (Signed)
furosemide (LASIX) 40 MG tablet Please call into [facility]

## 2015-12-14 ENCOUNTER — Telehealth: Payer: Self-pay | Admitting: Cardiology

## 2015-12-14 NOTE — Telephone Encounter (Signed)
Confirmed remote transmission w/ pt emergency contact.

## 2015-12-18 ENCOUNTER — Telehealth: Payer: Self-pay

## 2015-12-18 NOTE — Telephone Encounter (Signed)
Follow up:  Pt is calling you back in regards to device check

## 2015-12-18 NOTE — Telephone Encounter (Signed)
Remote ICM transmission received.  Attempted patient call and left message for return call.   

## 2015-12-23 NOTE — Telephone Encounter (Signed)
Unable to reach patient for ICM follow up.  Corvue impedance on 12/15/2015 trending at reference line.  Next remote ICM transmission scheduled for 01/06/2016.  Patient has appointment with Dr Diona Browner on 01/11/2016.  ICM trend for 12/15/2015

## 2016-01-11 ENCOUNTER — Telehealth: Payer: Self-pay | Admitting: Internal Medicine

## 2016-01-11 ENCOUNTER — Ambulatory Visit: Payer: Commercial Managed Care - HMO | Admitting: Cardiology

## 2016-01-11 NOTE — Telephone Encounter (Signed)
New message     FYI Patient had a bad stroke on sat.  He is at [facility] in [place]

## 2016-01-11 NOTE — Telephone Encounter (Signed)
Spoke with patient's wife.  Had CVA on Sat.  Went to [facility].   He is improving and right side has come back some.  Was able to speak a few words today and feed himself.  She just wanted Dr Johney Frame to be aware and says she can be contacted at the number below if he has any questions.    He was flown to [place] from [place] on Sat per wife because they were told that was the best place for CVA treatment.  She told them his Cardiologist was in [place].   Will forward to Dr Johney Frame for [facility]

## 2016-01-19 NOTE — Progress Notes (Signed)
ICM remote transmission rescheduled due to patient is hospitalized for stroke on 01/09/2016.  Next ICM remote transmission scheduled for 02/10/2016.

## 2016-01-25 ENCOUNTER — Other Ambulatory Visit: Payer: Self-pay | Admitting: Cardiology

## 2016-02-01 ENCOUNTER — Telehealth: Payer: Self-pay

## 2016-02-01 NOTE — Telephone Encounter (Signed)
Call to patient regarding ICM transmission and spoke with wife.  She stated patient had a stroke and came home from rehab about a week ago.  Since then, he has been back to ER for gout and he has really bad hiccups today.  She stated patient cognitively unable to send a manual transmission and to call her back tomorrow.  She stated she will send one with assistance.

## 2016-02-02 ENCOUNTER — Encounter: Payer: Medicare HMO | Admitting: *Deleted

## 2016-02-02 ENCOUNTER — Telehealth: Payer: Self-pay | Admitting: Cardiology

## 2016-02-02 NOTE — Telephone Encounter (Signed)
LMOVM reminding pt to send remote transmission.   

## 2016-02-03 ENCOUNTER — Encounter: Payer: Self-pay | Admitting: Internal Medicine

## 2016-02-04 ENCOUNTER — Ambulatory Visit (HOSPITAL_COMMUNITY): Payer: Medicare HMO | Attending: Physical Medicine and Rehabilitation | Admitting: Speech Pathology

## 2016-02-04 ENCOUNTER — Ambulatory Visit (HOSPITAL_COMMUNITY): Payer: Medicare HMO | Admitting: Speech Pathology

## 2016-02-04 ENCOUNTER — Encounter: Payer: Self-pay | Admitting: Cardiology

## 2016-02-04 ENCOUNTER — Ambulatory Visit (INDEPENDENT_AMBULATORY_CARE_PROVIDER_SITE_OTHER): Payer: Medicare HMO | Admitting: Cardiology

## 2016-02-04 VITALS — BP 101/69 | HR 67 | Ht 70.5 in | Wt 230.0 lb

## 2016-02-04 DIAGNOSIS — R4701 Aphasia: Secondary | ICD-10-CM

## 2016-02-04 DIAGNOSIS — I5022 Chronic systolic (congestive) heart failure: Secondary | ICD-10-CM | POA: Diagnosis not present

## 2016-02-04 DIAGNOSIS — E785 Hyperlipidemia, unspecified: Secondary | ICD-10-CM | POA: Diagnosis not present

## 2016-02-04 DIAGNOSIS — R488 Other symbolic dysfunctions: Secondary | ICD-10-CM

## 2016-02-04 DIAGNOSIS — I638 Other cerebral infarction: Secondary | ICD-10-CM

## 2016-02-04 DIAGNOSIS — I472 Ventricular tachycardia, unspecified: Secondary | ICD-10-CM

## 2016-02-04 DIAGNOSIS — R480 Dyslexia and alexia: Secondary | ICD-10-CM | POA: Insufficient documentation

## 2016-02-04 DIAGNOSIS — I6389 Other cerebral infarction: Secondary | ICD-10-CM

## 2016-02-04 NOTE — Therapy (Signed)
[facility] [address] [place], [place], [address] Phone: [phone]   Fax:  [phone]  Speech Language Pathology Evaluation  Patient Details  Name: Adam [name] MRN: [id number] Date of Birth: 29-Jun-1966 Referring Provider: Alison Murray  Encounter Date: 02/04/2016      End of Session - 02/04/16 1957    Visit Number 1   Number of Visits 24   Date for SLP Re-Evaluation 04/04/16   Authorization Type [employer]   Authorization Time Period 02/04/2016-04/04/2016   SLP Start Time 1515   SLP Stop Time  1600   SLP Time Calculation (min) 45 min   Activity Tolerance Patient tolerated treatment well      Past Medical History  Diagnosis Date  . Mitral regurgitation   . NICM (nonischemic cardiomyopathy) (HCC)     Nonischemic / catheterization October, 2011, normal coronary arteries  . Chronic systolic heart failure (HCC)   . Ejection fraction < 50%     Echo 06/05/12: Mild LVH, EF 20%, posterior severe HK, anterolateral severe HK, anterior severe HK, mid to apical septal AK, AK of the true apex, apical inferior HK, restrictive physiology, trivial MR, moderate LAE, mildly reduced RV function, mild TR.  . ICD (implantable cardiac defibrillator) battery depletion 11/11/10    St. Jude, Dr.Allred  . Renal insufficiency     Creatinine 1.5 (GFR greater than 50)  . Headache(784.0)     Patient seen to have headaches from spironolactone.  Drug was stopped, August, 2012  . Sleepiness     has some sleepiness during the day  . 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 VT (ventricular tachycardia) (HCC)     VT storm 7/13 - Sotalol started  . Drug therapy     Sotalol started for VT storm  July, 2013  . Hypokalemia     There was some decrease in potassium with his admission in July, 2013. We will watch this very carefully.  . Disability examination     Discussion of disability September, 2013    Past Surgical History  Procedure  Laterality Date  . Tee with cardioversion  11/17/08    No LV clot  . Cardiac defibrillator placement  11/11/10    SJM Fortify DR ICD implanted by Dr Johney Frame    There were no vitals filed for this visit.  Visit Diagnosis: Aphasia - Plan: SLP plan of care cert/re-cert  Dyslexia and alexia - Plan: SLP plan of care cert/re-cert  Agraphia - Plan: SLP plan of care cert/re-cert      Subjective Assessment - 02/04/16 1924    Subjective "I want everything back right."   Patient is accompained by: Family member   Special Tests Portions of Western Aphasia Battery   Currently in Pain? No/denies            SLP Evaluation [facility] - 02/04/16 1924    SLP Visit Information   SLP Received On 02/04/16   Referring Provider Alison Murray   Onset Date 01/09/2016   Medical Diagnosis s/p CVA   Subjective   Subjective "I want everything back right."   Patient/Family Stated Goal wants to work on "speech"    General Information   HPI [name] is a 50 yo male from [place], [place] with a past medical history of atrial fibrillation, CHF, s/p cardiac pacemaker implantation, and gout who developed right hemiplegia and global aphasia on 01/09/2016. He was taken to [facility] where his studies showed left MCA  stroke. He received IV TPA at [facility] and was airlifted to [facility] as stroke code. 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 He developed SOB and was started on BIPAP during that hospitalization. He was transferred to an inpatient rehabilitation facility on 01/15/16, but showed seizure activity on 01/19/16 and was re-hospitalized. He was discharged home from that hospitalization on 01/20/2016. He has not received speech therapy since 01/19/16 unfortunately. He was referred by Dr. Alison Murray for outpatient SLP services due to aphasia from recent stroke. He was accompanied to the appointment by [name], his wife.     Behavioral/Cognition Alert, cooperative   Mobility Status Ambulatory   Prior Functional Status   Cognitive/Linguistic Baseline Within functional limits   Type of Home House    Lives With Spouse   Available Support Family   Education 12th grade   Vocation On disability   Cognition   Overall Cognitive Status Difficult to assess  due to severity of aphasia   Area of Impairment Following commands   Difficult to assess due to Impaired communication   Following Commands Follows multi-step commands inconsistently   Memory Appears intact   Awareness Impaired   Awareness Impairment Emergent impairment   Problem Solving Impaired   Problem Solving Impairment Verbal basic;Functional complex   Executive Function Self Monitoring;Self Correcting;Decision Making   Decision Making Impaired   Decision Making Impairment Verbal basic;Functional complex   Self Monitoring Impaired   Self Monitoring Impairment Verbal complex   Self Correcting Impaired   Self Correcting Impairment Verbal complex;Functional complex   Behaviors Perseveration   Auditory Comprehension   Overall Auditory Comprehension Impaired   Yes/No Questions Impaired   Basic Biographical Questions 26-50% accurate   Complex Questions 25-49% accurate   Commands Impaired   One Step Basic Commands 75-100% accurate   Two Step Basic Commands 50-74% accurate   Multistep Basic Commands 50-74% accurate   Conversation Moderately complex   Other Conversation Comments --  fluctuation in comprehension   EffectiveTechniques Repetition;Stressing words;Visual/Gestural cues   Overall Auditory Comprehension Comments --  inconsistent with comprehension   Visual Recognition/Discrimination   Discrimination Within Function Limits   Reading Comprehension   Reading Status Impaired   Word level 51-75% accurate   Sentence Level 0-25% accurate   Paragraph Level Not tested   Functional Environmental (signs, name badge) Not tested   Interfering  Components Visual scanning   Effective Techniques Tactile cueing   Expression   Primary Mode of Expression Verbal   Verbal Expression   Overall Verbal Expression Impaired   Initiation Impaired   Level of Generative/Spontaneous Verbalization Sentence   Repetition Impaired   Level of Impairment Phrase level  to be further assessed   Naming Impairment   Responsive 76-100% accurate   Confrontation 50-74% accurate   Convergent 0-24% accurate   Divergent 0-24% accurate   Verbal Errors Semantic paraphasias;Perseveration;Aware of errors;Not aware of errors;Inconsistent   Pragmatics No impairment   Effective Techniques Open ended questions;Semantic cues;Sentence completion;Phonemic cues;Written cues   Non-Verbal Means of Communication Not applicable   Written Expression   Dominant Hand Right   Written Expression Exceptions to [facility]   Self Formulation Ability Word   Overall Writen Expression able to write his first name   Oral Motor/Sensory Function   Overall Oral Motor/Sensory Function Impaired   Labial ROM Reduced right   Labial Symmetry Abnormal symmetry right   Labial Strength Reduced Right  Labial Sensation Reduced Right   Labial Coordination WFL   Lingual ROM Reduced right   Lingual Symmetry Within Functional Limits   Lingual Strength Within Functional Limits   Lingual Sensation Within Functional Limits   Lingual Coordination WFL   Facial ROM Within Functional Limits   Facial Symmetry Right droop  mild   Facial Coordination WFL   Velum Within Functional Limits   Mandible Within Functional Limits   Overall Oral Motor/Sensory Function mild impairment   Motor Speech   Overall Motor Speech Impaired   Respiration Impaired   Level of Impairment Sentence   Phonation Normal   Resonance Within functional limits   Articulation Within functional limitis   Intelligibility Intelligible   Motor Planning Witnin functional limits   Motor Speech Errors Not applicable   Phonation WFL    Standardized Assessments   Standardized Assessments  Western Aphasia Battery  portions of                         SLP Education - 02/04/16 1956    Education provided Yes   Education Details Results of evaluation and plan for treatment; info regarding aphasia and perseveration; HEP   Person(s) Educated Patient;Spouse   Methods Explanation;Verbal cues;Handout   Comprehension Verbalized understanding;Need further instruction          SLP Short Term Goals - 02/04/16 2014    SLP SHORT TERM GOAL #1   Title Pt will increase naming of common objects/pictures to 90% acc when provided with mod/max multimodality cues    Baseline 72%   Time 8   Period Weeks   Status New   SLP SHORT TERM GOAL #2   Title Pt will complete single word sentence completion tasks [facility]) with 90% acc when provided with mod multimodality cues.   Baseline 70%   Time 8   Period Weeks   Status New   SLP SHORT TERM GOAL #3   Title Pt will increase divergent naming to 5 items per concrete category with mod cues.   Baseline 1-2   Time 8   Period Weeks   Status New   SLP SHORT TERM GOAL #4   Title Pt will complete basic level reading comprehension tasks (picture cues for sentence level) with 70% acc with provided mod cues.   Baseline 25%   Time 8   Period Weeks   Status New   SLP SHORT TERM GOAL #5   Title Pt will answer moderate level auditory comprehension yes/no questions with 80% acc when provided mod cues.   Baseline 65%   Time 8   Period Weeks   Status New   Additional Short Term Goals   Additional Short Term Goals Yes   SLP SHORT TERM GOAL #6   Title Pt will be able to write personal/bio information with 90% acc when provided mod cues   Baseline name only   Time 8   Period Weeks   Status New          SLP Long Term Goals - 02/04/16 2015    SLP LONG TERM GOAL #1   Title Pt will communicate moderate level wants/needs/thoughts to family and friends with use of multimodality  communication strategies as needed.    Baseline mod/severe impairment   Time 3   Period Months   Status New          Plan - 02/04/16 1958    Clinical Impression Statement Mr. Marshaun Fesperman presents with mod/severe  mixed aphasia, alexia, and agraphia characterized by significant difficulty in initiating speech and generating novel sentences, decreased confrontation naming with verbal perseveration of reponses, severe deficits in divergent naming (1 food item, 2 animals), relative strength in responsive naming tasks, paraphasic errors (semantic) during picture description task, and decreased auditory and reading comprehension. Mr. Koe was able to complete basic level addition problems, but unable to complete subtraction, multiplication, or division. He demonstrated emerging awareness of speech substitutions and auditory comprehension deficits. Mr. Vangenderen and his wife are eager to improve his speech and language skills. Skilled SLP services is recommended to address the above stated deficits, increase independence with communication and cognition, and decrease burden of care. Prognosis for improvement is excellent given pt's motivation and family support. Cognitive assessment was limited due to severity of receptive and expressive aphasia, but is certainly impacted by language deficits. This will be addressed during therapy. Pt/wife in agreement with plan of care.    Speech Therapy Frequency 3x / week   Duration --  8 weeks   Treatment/Interventions Language facilitation;Compensatory techniques;SLP instruction and feedback;Cueing hierarchy;Patient/family education;Compensatory strategies;Multimodal communcation approach;Functional tasks   Potential to Achieve Goals Good   Potential Considerations Severity of impairments   SLP Home Exercise Plan Pt will be independent with HEP to facilitate carryover of treatment strategies and techniques in home/community environment with assist from wife as  needed.    Consulted and Agree with Plan of Care Patient;Family member/caregiver   Family Member Consulted Wife, [name]          G-Codes - 2016/02/15 2021-03-09    Functional Assessment Tool Used clinical judgment   Functional Limitations Spoken language expressive   Spoken Language Expression Current Status [phone]) At least 60 percent but less than 80 percent impaired, limited or restricted   Spoken Language Expression Goal Status [phone]) At least 1 percent but less than 20 percent impaired, limited or restricted      Problem List Patient Active Problem List   Diagnosis Date Noted  . Lightheadedness 01/24/2014  . Nonischemic cardiomyopathy (HCC) 10/29/2013  . Chronic systolic CHF (congestive heart failure) (HCC) 04/26/2013  . Chest discomfort 04/26/2013  . Bradycardia 04/26/2013  . Automatic implantable cardioverter-defibrillator in situ 09/04/2012  . Hypokalemia   . Disability examination   . Drug therapy   . Ventricular tachyarrhythmia (HCC) 06/06/2012  . Headache(784.0)   . Sleepiness   . Mitral regurgitation   . Cardiomyopathy (HCC)   . Ejection fraction < 50%   . Renal insufficiency    Thank you,  Havery Moros, CCC-SLP [phone]  [facility] 02/15/2016, 8:24 PM  [facility] [address] [place], [place], [address] Phone: [phone]   Fax:  [phone]  Name: Amaurion Feith Fedder MRN: [id number] Date of Birth: 1966-08-20

## 2016-02-04 NOTE — Progress Notes (Signed)
Patient ID: Zavian Slowey Akkerman, male   DOB: 1966/08/08, 50 y.o.   MRN: [id number]     Clinical Summary Mr. Stclair is a 50 y.o.male seen today for follow up, she is a former patient of Dr Myrtis Ser. This is our first visit together.   1. NICM/Chronic systolic HF 01/2013 echo LVEF 30-35%, cannot eval diastolic dysfunction. - cath 08/2010 patent coronaries - ICD followed by EP. History of VT, ICD indications include secondary prevention. He is also  - denies any SOB or DOE. No LE edema - compliant with meds  2. CKD  3. History of VT - followed by EP. He is on sotalol, also has ICD - no recent palpitations.   4. CVA - recent left MCA stroke early 01/09/16, received tPA.Marland Kitchen Readmit later in the month with possible seizure activity.  - from [place] records discharge summary mentions patient medical history of afib, however I do not see a corresponding diagnosis in the [facility] record. The diagnosis of afib is listed in the intial H&P as well - device check 07/2015 showed 3 minute episode of aflutter  - continued speech difficulty. Right sided weakness resolved.  5. Hyperlipidemia - 01/2016 labs at [facility] showed TC 217 HDL 27 TG 128 LDL 164 - recently started on high dose statin during admission   Past Medical History  Diagnosis Date  . Mitral regurgitation   . NICM (nonischemic cardiomyopathy) (HCC)     Nonischemic / catheterization October, 2011, normal coronary arteries  . Chronic systolic heart failure (HCC)   . Ejection fraction < 50%     Echo 06/05/12: Mild LVH, EF 20%, posterior severe HK, anterolateral severe HK, anterior severe HK, mid to apical septal AK, AK of the true apex, apical inferior HK, restrictive physiology, trivial MR, moderate LAE, mildly reduced RV function, mild TR.  . ICD (implantable cardiac defibrillator) battery depletion 11/11/10    St. Jude, Dr.Allred  . Renal insufficiency     Creatinine 1.5 (GFR greater than 50)  . Headache(784.0)     Patient seen to have  headaches from spironolactone.  Drug was stopped, August, 2012  . Sleepiness     has some sleepiness during the day  . 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 VT (ventricular tachycardia) (HCC)     VT storm 7/13 - Sotalol started  . Drug therapy     Sotalol started for VT storm  July, 2013  . Hypokalemia     There was some decrease in potassium with his admission in July, 2013. We will watch this very carefully.  . Disability examination     Discussion of disability September, 2013     Allergies  Allergen Reactions  . Atorvastatin     headaches     Current Outpatient Prescriptions  Medication Sig Dispense Refill  . allopurinol (ZYLOPRIM) 100 MG tablet Take 1 tablet by mouth daily as needed. Gout    . aspirin EC 81 MG tablet Take 81 mg by mouth daily.    . benazepril (LOTENSIN) 5 MG tablet TAKE ONE TABLET BY MOUTH ONCE DAILY 30 tablet 6  . carvedilol (COREG) 6.25 MG tablet TAKE ONE TABLET BY MOUTH TWICE DAILY 60 tablet 6  . diclofenac (VOLTAREN) 75 MG EC tablet Take 1 tablet by mouth daily as needed. Pain    . furosemide (LASIX) 40 MG tablet Take 1 tablet (40 mg total) by mouth 2 (two) times daily. 60 tablet 6  . magnesium oxide (MAG-OX) 400 MG tablet Take  400 mg by mouth daily.    Marland Kitchen omeprazole (PRILOSEC) 20 MG capsule Take 1 capsule (20 mg total) by mouth 2 (two) times daily. 60 capsule 6  . potassium chloride SA (K-DUR,KLOR-CON) 20 MEQ tablet Take 10 mEq by mouth 2 (two) times daily.    . sotalol (BETAPACE) 120 MG tablet TAKE ONE TABLET BY MOUTH EVERY TWELVE HOURS. 60 tablet 6   No current facility-administered medications for this visit.     Past Surgical History  Procedure Laterality Date  . Tee with cardioversion  11/17/08    No LV clot  . Cardiac defibrillator placement  11/11/10    SJM Fortify DR ICD implanted by Dr Johney Frame     Allergies  Allergen Reactions  . Atorvastatin     headaches      Family History  Problem Relation Age of Onset  .  Heart attack Father 1    Multiple MIs  . Heart attack Brother   . Heart failure Brother     CHF  . Hypertension Mother   . Heart attack Paternal Grandfather      Social History Mr. Coughran reports that he has never smoked. He has never used smokeless tobacco. Mr. Liverpool reports that he drinks alcohol.   Review of Systems CONSTITUTIONAL: No weight loss, fever, chills, weakness or fatigue.  HEENT: Eyes: No visual loss, blurred vision, double vision or yellow sclerae.No hearing loss, sneezing, congestion, runny nose or sore throat.  SKIN: No rash or itching.  CARDIOVASCULAR: no chest pain, no palpitations RESPIRATORY: No shortness of breath, cough or sputum.  GASTROINTESTINAL: No anorexia, nausea, vomiting or diarrhea. No abdominal pain or blood.  GENITOURINARY: No burning on urination, no polyuria NEUROLOGICAL: difficultly with speech MUSCULOSKELETAL: No muscle, back pain, joint pain or stiffness.  LYMPHATICS: No enlarged nodes. No history of splenectomy.  PSYCHIATRIC: No history of depression or anxiety.  ENDOCRINOLOGIC: No reports of sweating, cold or heat intolerance. No polyuria or polydipsia.  Marland Kitchen   Physical Examination Filed Vitals:   02/04/16 1122  BP: 101/69  Pulse: 67   Filed Vitals:   02/04/16 1122  Height: 5' 10.5" (1.791 m)  Weight: 230 lb (104.327 kg)    Gen: resting comfortably, no acute distress HEENT: no scleral icterus, pupils equal round and reactive, no palptable cervical adenopathy,  CV: RRR, no m/r/g, no jvd Resp: Clear to auscultation bilaterally GI: abdomen is soft, non-tender, non-distended, normal bowel sounds, no hepatosplenomegaly MSK: extremities are warm, no edema.  Skin: warm, no rash Neuro:  no focal deficits Psych: appropriate affect   Diagnostic Studies 01/2013 echo Study Conclusions  - Left ventricle: The cavity size was normal. There was moderate concentric hypertrophy with thinning and akinesis of the posterior wall. LV  Apical false tendon. There is posterior akinesis and inferoapical severe hypokinesis. The mid to distal anterior wall and septum is also severely hypokinetic. Systolic function was moderately to severely reduced. The estimated ejection fraction was in the range of 30% to 35%. The study is not technically sufficient to allow evaluation of LV diastolic function. - Aortic valve: Transvalvular velocity was within the normal range. There was no stenosis. Trace to mild central regurgitation. - Mitral valve: Mildly thickened leaflets with minimal, bi-leafletlate systolic prolapse. Mild regurgitation. - Left atrium: LA Volume/ BSA = 30.14ml/m2 The atrium was mildly dilated. - Right ventricle: The cavity size was normal. Wall thickness was normal. AICD wirenoted in right ventricle. Systolic function was normal. - Right atrium: The atrium was normal in size.  AICD wire noted in right atrium. - Tricuspid valve: Mild regurgitation. - Inferior vena cava: The vessel was normal in size; the respirophasic diameter changes were in the normal range (= 50%); findings are consistent with normal central venous pressure.   01/2015 Echo [facility] Interpretation Summary  A complete portable two-dimensional transthoracic echocardiogram with color  flow Doppler and Spectral Doppler was performed. Saline contrast injection  was performed.  The left ventricle is mildly dilated.  There is normal left ventricular wall thickness.  There is akinesis of the inferior, posterior, and lateral walls.  There is moderate diffuse hypokinesis of the remaining left ventricular  segments.  The left ventricular ejection fraction is markedly reduced (25-30%).  The left ventricular diastolic function is normal.  The aortic valve is not well visualized, but is grossly normal.  Injection of contrast documented no interatrial shunt .    01/2015 [facility] IMPRESSION:    1. Vasculature is unchanged and without stenosis or an aneurysm or cutoff sign. No hemorrhage.  2. Possible developing hypodensity in the left insular region.  3. Old infarct in the right occipital lobe.  4. Right upper lobe pneumonia.   Assessment and Plan  1. NICM/Chronic systolic HF - no current symptoms. Soft bp's, will not titrate meds further today  2. History of VT - ICD followed by EP, continue regular device checks  3. CVA - recent CVA at [facility] He was started on anticoagulation during that admission, his history of afib is unlcear to me, from chart review I do not see this diagnosis listed - short episodes of afllutter noted on previous device checks, we will continue anticoag.    4. Hyperlipidemia - continue high dose statin s/p CVA - notes indicate lipitor in the past caused headaches, but curretly tolerating.   Antoine Poche, M.D.

## 2016-02-04 NOTE — Patient Instructions (Signed)
Your physician wants you to follow-up in: 4 MONTHS WITH DR BRANCH You will receive a reminder letter in the mail two months in advance. If you don't receive a letter, please call our office to schedule the follow-up appointment.  Your physician recommends that you continue on your current medications as directed. Please refer to the Current Medication list given to you today.  Thank you for choosing [facility]!!   

## 2016-02-05 ENCOUNTER — Encounter: Payer: Self-pay | Admitting: Cardiology

## 2016-02-08 ENCOUNTER — Ambulatory Visit (HOSPITAL_COMMUNITY): Payer: Medicare HMO | Attending: Physical Medicine and Rehabilitation | Admitting: Speech Pathology

## 2016-02-08 DIAGNOSIS — R480 Dyslexia and alexia: Secondary | ICD-10-CM | POA: Diagnosis present

## 2016-02-08 DIAGNOSIS — R4701 Aphasia: Secondary | ICD-10-CM

## 2016-02-08 DIAGNOSIS — R488 Other symbolic dysfunctions: Secondary | ICD-10-CM

## 2016-02-08 NOTE — Therapy (Signed)
[facility] [address] [place], [place], [address] Phone: [phone]   Fax:  [phone]  Speech Language Pathology Treatment  Patient Details  Name: Adam [name] MRN: [id number] Date of Birth: 10/14/1966 Referring Provider: Alison Murray  Encounter Date: 02/08/2016      End of Session - 02/08/16 1809    Visit Number 2   Number of Visits 24   Date for SLP Re-Evaluation 04/04/16   Authorization Type [employer]   Authorization Time Period 02/04/2016-04/04/2016   SLP Start Time 1208   SLP Stop Time  1255   SLP Time Calculation (min) 47 min   Activity Tolerance Patient tolerated treatment well      Past Medical History  Diagnosis Date  . Mitral regurgitation   . NICM (nonischemic cardiomyopathy) (HCC)     Nonischemic / catheterization October, 2011, normal coronary arteries  . Chronic systolic heart failure (HCC)   . Ejection fraction < 50%     Echo 06/05/12: Mild LVH, EF 20%, posterior severe HK, anterolateral severe HK, anterior severe HK, mid to apical septal AK, AK of the true apex, apical inferior HK, restrictive physiology, trivial MR, moderate LAE, mildly reduced RV function, mild TR.  . ICD (implantable cardiac defibrillator) battery depletion 11/11/10    [employer], Dr.Allred  . Renal insufficiency     Creatinine 1.5 (GFR greater than 50)  . Headache(784.0)     Patient seen to have headaches from spironolactone.  Drug was stopped, August, 2012  . Sleepiness     has some sleepiness during the day  . 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 VT (ventricular tachycardia) (HCC)     VT storm 7/13 - Sotalol started  . Drug therapy     Sotalol started for VT storm  July, 2013  . Hypokalemia     There was some decrease in potassium with his admission in July, 2013. We will watch this very carefully.  . Disability examination     Discussion of disability September, 2013    Past Surgical History  Procedure  Laterality Date  . Tee with cardioversion  11/17/08    No LV clot  . Cardiac defibrillator placement  11/11/10    SJM Fortify DR ICD implanted by Dr Johney Frame    There were no vitals filed for this visit.  Visit Diagnosis: Aphasia  Dyslexia and alexia  Agraphia      Subjective Assessment - 02/08/16 1800    Subjective "If I say this, I want to say that."   Patient is accompained by: Family member   Special Tests Portions of Western Aphasia Battery   Currently in Pain? No/denies               ADULT SLP TREATMENT - 02/08/16 0001    General Information   Behavior/Cognition Alert;Cooperative;Pleasant mood   Patient Positioning Upright in chair   Oral care provided N/A   HPI Adam [name] is a 50 yo male from [place], [place] with a past medical history of atrial fibrillation, CHF, s/p cardiac pacemaker implantation, and gout who developed right hemiplegia and global aphasia on 01/09/2016. He was taken to [facility] where his studies showed left MCA stroke. He received IV TPA at [facility] and was airlifted to [facility] as stroke code. 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 He developed SOB and was started on BIPAP during that  hospitalization. He was transferred to an inpatient rehabilitation facility on 01/15/16, but showed seizure activity on 01/19/16 and was re-hospitalized. He was discharged home from that hospitalization on 01/20/2016. He has not received speech therapy since 01/19/16 unfortunately. He was referred by Dr. Alison Murray for outpatient SLP services due to aphasia from recent stroke. He was accompanied to the appointment by Adam [name], his wife   Treatment Provided   Treatment provided Cognitive-Linquistic   Pain Assessment   Pain Assessment No/denies pain   Cognitive-Linquistic Treatment   Treatment focused on Aphasia;Patient/family/caregiver education   Skilled Treatment pt education,  compensatory strategies, word finding activities, automatic speech drills   Assessment / Recommendations / Plan   Plan Continue with current plan of care   Progression Toward Goals   Progression toward goals Progressing toward goals            SLP Short Term Goals - 02/08/16 2119    SLP SHORT TERM GOAL #1   Title Pt will increase naming of common objects/pictures to 90% acc when provided with mod/max multimodality cues    Baseline 72%   Time 8   Period Weeks   Status On-going   SLP SHORT TERM GOAL #2   Title Pt will complete single word sentence completion tasks [facility]) with 90% acc when provided with mod multimodality cues.   Baseline 70%   Time 8   Period Weeks   Status On-going   SLP SHORT TERM GOAL #3   Title Pt will increase divergent naming to 5 items per concrete category with mod cues.   Baseline 1-2   Time 8   Period Weeks   Status On-going   SLP SHORT TERM GOAL #4   Title Pt will complete basic level reading comprehension tasks (picture cues for sentence level) with 70% acc with provided mod cues.   Baseline 25%   Time 8   Period Weeks   Status On-going   SLP SHORT TERM GOAL #5   Title Pt will answer moderate level auditory comprehension yes/no questions with 80% acc when provided mod cues.   Baseline 65%   Time 8   Period Weeks   Status On-going   SLP SHORT TERM GOAL #6   Title Pt will be able to write personal/bio information with 90% acc when provided mod cues   Baseline name only   Time 8   Period Weeks   Status On-going          SLP Long Term Goals - 02/08/16 2121    SLP LONG TERM GOAL #1   Title Pt will communicate moderate level wants/needs/thoughts to family and friends with use of multimodality communication strategies as needed.    Baseline mod/severe impairment   Time 3   Period Months   Status On-going          Plan - 02/08/16 1710    Clinical Impression Statement [name] was accompanied by his fiance today. Both report  improvement in expressive language over the past few days. Session focused on elicitation of automatic speech tasks in an attempt to break perseverations. SLP provided mod/max cues to break perseverations with multimodality cues. Affect still somewhat flat and fiance reports that he is pretty quiet at home. SLP reinforced word description tasks to encourage pt to try other ways to convey message. Continue POC.    Speech Therapy Frequency 3x / week   Duration --  8 weeks   Treatment/Interventions Language facilitation;Compensatory techniques;SLP instruction and feedback;Cueing hierarchy;Patient/family education;Compensatory  strategies;Multimodal communcation approach;Functional tasks   Potential to Achieve Goals Good   Potential Considerations Severity of impairments   SLP Home Exercise Plan Pt will be independent with HEP to facilitate carryover of treatment strategies and techniques in home/community environment with assist from wife as needed.    Consulted and Agree with Plan of Care Patient;Family member/caregiver   Family Member Consulted Wife, Adam [name]        Problem List Patient Active Problem List   Diagnosis Date Noted  . Lightheadedness 01/24/2014  . Nonischemic cardiomyopathy (HCC) 10/29/2013  . Chronic systolic CHF (congestive heart failure) (HCC) 04/26/2013  . Chest discomfort 04/26/2013  . Bradycardia 04/26/2013  . Automatic implantable cardioverter-defibrillator in situ 09/04/2012  . Hypokalemia   . Disability examination   . Drug therapy   . Ventricular tachyarrhythmia (HCC) 06/06/2012  . Headache(784.0)   . Sleepiness   . Mitral regurgitation   . Cardiomyopathy (HCC)   . Ejection fraction < 50%   . Renal insufficiency    Thank you,  Havery Moros, CCC-SLP [phone]  [facility] 02/08/2016, 9:24 PM  [facility] [address] [place], [place], [address] Phone: [phone]   Fax:  [phone]   Name:  Jyair Kiraly Leiphart MRN: [id number] Date of Birth: 05/07/1966

## 2016-02-10 ENCOUNTER — Encounter (HOSPITAL_COMMUNITY): Payer: Self-pay | Admitting: Speech Pathology

## 2016-02-10 ENCOUNTER — Ambulatory Visit (INDEPENDENT_AMBULATORY_CARE_PROVIDER_SITE_OTHER): Payer: Medicare HMO | Admitting: *Deleted

## 2016-02-10 ENCOUNTER — Ambulatory Visit (HOSPITAL_COMMUNITY): Payer: Medicare HMO | Admitting: Speech Pathology

## 2016-02-10 ENCOUNTER — Ambulatory Visit: Payer: Medicare HMO

## 2016-02-10 DIAGNOSIS — I429 Cardiomyopathy, unspecified: Secondary | ICD-10-CM

## 2016-02-10 DIAGNOSIS — R480 Dyslexia and alexia: Secondary | ICD-10-CM

## 2016-02-10 DIAGNOSIS — Z95810 Presence of automatic (implantable) cardiac defibrillator: Secondary | ICD-10-CM

## 2016-02-10 DIAGNOSIS — R4701 Aphasia: Secondary | ICD-10-CM | POA: Diagnosis not present

## 2016-02-10 NOTE — Therapy (Signed)
[facility] [address] [place], [place], [address] Phone: [phone]   Fax:  [phone]  Speech Language Pathology Treatment  Patient Details  Name: Adam [name] MRN: [id number] Date of Birth: 1966/05/09 Referring Provider: Alison Murray  Encounter Date: 02/10/2016      End of Session - 02/10/16 1429    Visit Number 3   Number of Visits 24   Date for SLP Re-Evaluation 04/04/16   Authorization Type [employer]   Authorization Time Period 02/04/2016-04/04/2016   SLP Start Time 1155   SLP Stop Time  1255   SLP Time Calculation (min) 60 min   Activity Tolerance Patient tolerated treatment well      Past Medical History  Diagnosis Date  . Mitral regurgitation   . NICM (nonischemic cardiomyopathy) (HCC)     Nonischemic / catheterization October, 2011, normal coronary arteries  . Chronic systolic heart failure (HCC)   . Ejection fraction < 50%     Echo 06/05/12: Mild LVH, EF 20%, posterior severe HK, anterolateral severe HK, anterior severe HK, mid to apical septal AK, AK of the true apex, apical inferior HK, restrictive physiology, trivial MR, moderate LAE, mildly reduced RV function, mild TR.  . ICD (implantable cardiac defibrillator) battery depletion 11/11/10    [employer] Jude, Dr.Allred  . Renal insufficiency     Creatinine 1.5 (GFR greater than 50)  . Headache(784.0)     Patient seen to have headaches from spironolactone.  Drug was stopped, August, 2012  . Sleepiness     has some sleepiness during the day  . 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 VT (ventricular tachycardia) (HCC)     VT storm 7/13 - Sotalol started  . Drug therapy     Sotalol started for VT storm  July, 2013  . Hypokalemia     There was some decrease in potassium with his admission in July, 2013. We will watch this very carefully.  . Disability examination     Discussion of disability September, 2013    Past Surgical History  Procedure  Laterality Date  . Tee with cardioversion  11/17/08    No LV clot  . Cardiac defibrillator placement  11/11/10    SJM Fortify DR ICD implanted by Dr Johney Frame    There were no vitals filed for this visit.  Visit Diagnosis: Aphasia  Dyslexia and alexia      Subjective Assessment - 02/10/16 1428    Subjective "It is ok."   Patient is accompained by: Family member   Special Tests Portions of Western Aphasia Battery   Currently in Pain? No/denies               ADULT SLP TREATMENT - 02/10/16 1428    General Information   Behavior/Cognition Alert;Cooperative;Pleasant mood   Patient Positioning Upright in chair   Oral care provided N/A   HPI [name] is a 50 yo male from [place], [place] with a past medical history of atrial fibrillation, CHF, s/p cardiac pacemaker implantation, and gout who developed right hemiplegia and global aphasia on 01/09/2016. He was taken to [facility] where his studies showed left MCA stroke. He received IV TPA at [facility] and was airlifted to [facility] as stroke code. 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 He developed SOB and was started on BIPAP during that hospitalization. He was transferred to an inpatient rehabilitation  facility on 01/15/16, but showed seizure activity on 01/19/16 and was re-hospitalized. He was discharged home from that hospitalization on 01/20/2016. He has not received speech therapy since 01/19/16 unfortunately. He was referred by Dr. Alison Murray for outpatient SLP services due to aphasia from recent stroke. He was accompanied to the appointment by [name], his wife   Treatment Provided   Treatment provided Cognitive-Linquistic   Pain Assessment   Pain Assessment No/denies pain   Cognitive-Linquistic Treatment   Treatment focused on Aphasia;Patient/family/caregiver education   Skilled Treatment pt education, compensatory strategies, word finding  activities, automatic speech drills   Assessment / Recommendations / Plan   Plan Continue with current plan of care   Progression Toward Goals   Progression toward goals Progressing toward goals          SLP Education - 02/10/16 1429    Education provided Yes   Education Details discussed emotional changes following a stroke with aphasia   Person(s) Educated Patient;Spouse   Methods Explanation   Comprehension Verbalized understanding          SLP Short Term Goals - 02/10/16 1430    SLP SHORT TERM GOAL #1   Title Pt will increase naming of common objects/pictures to 90% acc when provided with mod/max multimodality cues    Baseline 72%   Time 8   Period Weeks   Status On-going   SLP SHORT TERM GOAL #2   Title Pt will complete single word sentence completion tasks (SWSC) with 90% acc when provided with mod multimodality cues.   Baseline 70%   Time 8   Period Weeks   Status On-going   SLP SHORT TERM GOAL #3   Title Pt will increase divergent naming to 5 items per concrete category with mod cues.   Baseline 1-2   Time 8   Period Weeks   Status On-going   SLP SHORT TERM GOAL #4   Title Pt will complete basic level reading comprehension tasks (picture cues for sentence level) with 70% acc with provided mod cues.   Baseline 25%   Time 8   Period Weeks   Status On-going   SLP SHORT TERM GOAL #5   Title Pt will answer moderate level auditory comprehension yes/no questions with 80% acc when provided mod cues.   Baseline 65%   Time 8   Period Weeks   Status On-going   SLP SHORT TERM GOAL #6   Title Pt will be able to write personal/bio information with 90% acc when provided mod cues   Baseline name only   Time 8   Period Weeks   Status On-going          SLP Long Term Goals - 02/10/16 1430    SLP LONG TERM GOAL #1   Title Pt will communicate moderate level wants/needs/thoughts to family and friends with use of multimodality communication strategies as needed.     Baseline mod/severe impairment   Time 3   Period Months   Status On-going          Plan - 02/10/16 1430    Clinical Impression Statement [name] was accompanied by his fiance today. SLP facilitated confrontation naming task which pt was able to label common objects with 65% acc when provided mi/mod cues. Perseveration of previous response continues to interfere with accurate responses, however pt appears to be more aware of errors. Pt/family education completed regarding ways to increase accurate word productions at home. Plan to get a notebook for  pt. Continue POC.    Speech Therapy Frequency 3x / week   Duration --  8 weeks   Treatment/Interventions Language facilitation;Compensatory techniques;SLP instruction and feedback;Cueing hierarchy;Patient/family education;Compensatory strategies;Multimodal communcation approach;Functional tasks   Potential to Achieve Goals Good   Potential Considerations Severity of impairments   SLP Home Exercise Plan Pt will be independent with HEP to facilitate carryover of treatment strategies and techniques in home/community environment with assist from wife as needed.    Consulted and Agree with Plan of Care Patient;Family member/caregiver   Family Member Consulted Wife, [name]        Problem List Patient Active Problem List   Diagnosis Date Noted  . Lightheadedness 01/24/2014  . Nonischemic cardiomyopathy (HCC) 10/29/2013  . Chronic systolic CHF (congestive heart failure) (HCC) 04/26/2013  . Chest discomfort 04/26/2013  . Bradycardia 04/26/2013  . Automatic implantable cardioverter-defibrillator in situ 09/04/2012  . Hypokalemia   . Disability examination   . Drug therapy   . Ventricular tachyarrhythmia (HCC) 06/06/2012  . Headache(784.0)   . Sleepiness   . Mitral regurgitation   . Cardiomyopathy (HCC)   . Ejection fraction < 50%   . Renal insufficiency    Thank you,  Havery Moros,  CCC-SLP [phone]  [facility] 02/10/2016, 2:31 PM  [facility] [address] [place], [place], [address] Phone: [phone]   Fax:  [phone]   Name: Adam [name] MRN: [id number] Date of Birth: 02-24-1966

## 2016-02-15 ENCOUNTER — Telehealth: Payer: Self-pay | Admitting: Cardiology

## 2016-02-15 ENCOUNTER — Ambulatory Visit (HOSPITAL_COMMUNITY): Payer: Medicare HMO | Admitting: Speech Pathology

## 2016-02-15 DIAGNOSIS — R488 Other symbolic dysfunctions: Secondary | ICD-10-CM

## 2016-02-15 DIAGNOSIS — R480 Dyslexia and alexia: Secondary | ICD-10-CM

## 2016-02-15 DIAGNOSIS — R4701 Aphasia: Secondary | ICD-10-CM | POA: Diagnosis not present

## 2016-02-15 NOTE — Progress Notes (Signed)
No ICM transmission received on 02/10/2016.  Next ICM remote transmission scheduled for 03/08/2016.

## 2016-02-15 NOTE — Telephone Encounter (Signed)
Spoke w/ pt wife and requested that she send a manual transmission w/ pt home monitor b/c it has not updated in at least 8 days. Pt wife verbalized understanding.

## 2016-02-15 NOTE — Therapy (Signed)
[place], [place], [address] Phone: [phone]   Fax:  [phone]  Speech Language Pathology Treatment  Patient Details  Name: Adam [name] MRN: [id number] Date of Birth: Feb 06, 1966 Referring Provider: Leandro Reasoner  Encounter Date: 02/15/2016      End of Session - 02/15/16 1629    Visit Number 4   Number of Visits 24   Date for SLP Re-Evaluation 04/04/16   Authorization Type [employer]   Authorization Time Period 02/04/2016-04/04/2016   SLP Start Time 1307   SLP Stop Time  1351   SLP Time Calculation (min) 44 min   Activity Tolerance Patient tolerated treatment well      Past Medical History  Diagnosis Date  . Mitral regurgitation   . NICM (nonischemic cardiomyopathy) ([place])     Nonischemic / catheterization October, 2011, normal coronary arteries  . Chronic systolic heart failure ([place])   . Ejection fraction < 50%     Echo 06/05/12: Mild LVH, EF 20%, posterior severe HK, anterolateral severe HK, anterior severe HK, mid to apical septal AK, AK of the true apex, apical inferior HK, restrictive physiology, trivial MR, moderate LAE, mildly reduced RV function, mild TR.  . ICD (implantable cardiac defibrillator) battery depletion 11/11/10    St. Jude, Dr.Allred  . Renal insufficiency     Creatinine 1.5 (GFR greater than 50)  . Headache(784.0)     Patient seen to have headaches from spironolactone.  Drug was stopped, August, 2012  . Sleepiness     has some sleepiness during the day  . 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 VT (ventricular tachycardia) ([facility])     VT storm 7/13 - Sotalol started  . Drug therapy     Sotalol started for VT storm  July, 2013  . Hypokalemia     There was some decrease in potassium with his admission in July, 2013. We will watch this very carefully.  . Disability examination     Discussion of disability September, 2013    Past Surgical History  Procedure  Laterality Date  . Tee with cardioversion  11/17/08    No LV clot  . Cardiac defibrillator placement  11/11/10    SJM Fortify DR ICD implanted by Dr Rayann Heman    There were no vitals filed for this visit.      Subjective Assessment - 02/15/16 1628    Subjective "I feel.....Marland Kitchenmy speech....is getting better."   Patient is accompained by: Family member   Special Tests Portions of Western Aphasia Battery   Currently in Pain? No/denies               ADULT SLP TREATMENT - 02/15/16 1628    General Information   Behavior/Cognition Alert;Cooperative;Pleasant mood   Patient Positioning Upright in chair   Oral care provided N/A   HPI Adam [name] is a 50 yo male from [place], [place] with a past medical history of atrial fibrillation, CHF, s/p cardiac pacemaker implantation, and gout who developed right hemiplegia and global aphasia on 01/09/2016. He was taken to [facility] where his studies showed left MCA stroke. He received IV TPA at [facility] and was airlifted to [facility] as stroke code. 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 He developed SOB and was started on BIPAP during that hospitalization. He was transferred to an inpatient rehabilitation facility on 01/15/16, but showed seizure  activity on 01/19/16 and was re-hospitalized. He was discharged home from that hospitalization on 01/20/2016. He has not received speech therapy since 01/19/16 unfortunately. He was referred by Dr. Alison Murray for outpatient SLP services due to aphasia from recent stroke. He was accompanied to the appointment by [name], his wife   Treatment Provided   Treatment provided Cognitive-Linquistic   Pain Assessment   Pain Assessment No/denies pain   Cognitive-Linquistic Treatment   Treatment focused on Aphasia;Patient/family/caregiver education   Skilled Treatment pt education, compensatory strategies, word finding activities, automatic  speech drills   Assessment / Recommendations / Plan   Plan Continue with current plan of care   Progression Toward Goals   Progression toward goals Progressing toward goals            SLP Short Term Goals - 02/15/16 1631    SLP SHORT TERM GOAL #1   Title Pt will increase naming of common objects/pictures to 90% acc when provided with mod/max multimodality cues    Baseline 72%   Time 8   Period Weeks   Status On-going   SLP SHORT TERM GOAL #2   Title Pt will complete single word sentence completion tasks (SWSC) with 90% acc when provided with mod multimodality cues.   Baseline 70%   Time 8   Period Weeks   Status On-going   SLP SHORT TERM GOAL #3   Title Pt will increase divergent naming to 5 items per concrete category with mod cues.   Baseline 1-2   Time 8   Period Weeks   Status On-going   SLP SHORT TERM GOAL #4   Title Pt will complete basic level reading comprehension tasks (picture cues for sentence level) with 70% acc with provided mod cues.   Baseline 25%   Time 8   Period Weeks   Status On-going   SLP SHORT TERM GOAL #5   Title Pt will answer moderate level auditory comprehension yes/no questions with 80% acc when provided mod cues.   Baseline 65%   Time 8   Period Weeks   Status On-going   SLP SHORT TERM GOAL #6   Title Pt will be able to write personal/bio information with 90% acc when provided mod cues   Baseline name only   Time 8   Period Weeks   Status On-going          SLP Long Term Goals - 02/15/16 1631    SLP LONG TERM GOAL #1   Title Pt will communicate moderate level wants/needs/thoughts to family and friends with use of multimodality communication strategies as needed.    Baseline mod/severe impairment   Time 3   Period Months   Status On-going          Plan - 02/15/16 1629    Clinical Impression Statement [name] was accompanied by his fiance today. SLP provided a notebook to keep his speech homework and communication  strategies in. Mood appeared improved today. Pt presented with sentence length reading comprehension task with multiple choice response (single word). He required mod SLP cues to cover text to assist with visual scanning. Pt able to read visible text with 75% acc when provided occasional mild/mod verbal cues. At times, he appeared to orally read the word correctly, but not register its meaning and at times he was verbalized a different word (perseveration) but did understand the meaning. Pt selected correct single word response on 6/8 trials. Pt also requested to name single objects (LARK kit)  which he did with 80% acc with allowance for delays and self corrections. Perseveration noted to decrease today and this was shared with pt/fiance who were pleased. SLP also demonstrated ways to increase word finding in conversation with use of strategies (letter board for first letter, gesture, draw, describe). Today, pt was most effective in providing first letter cue supplemented with SLP writing for clarification. He is making excellent progress.    Speech Therapy Frequency 3x / week   Duration --  8 weeks   Treatment/Interventions Language facilitation;Compensatory techniques;SLP instruction and feedback;Cueing hierarchy;Patient/family education;Compensatory strategies;Multimodal communcation approach;Functional tasks   Potential to Achieve Goals Good   Potential Considerations Severity of impairments   SLP Home Exercise Plan Pt will be independent with HEP to facilitate carryover of treatment strategies and techniques in home/community environment with assist from wife as needed.    Consulted and Agree with Plan of Care Patient;Family member/caregiver   Family Member Consulted Wife, Erskine Speed      Patient will benefit from skilled therapeutic intervention in order to improve the following deficits and impairments:   Aphasia  Dyslexia and alexia  Agraphia    Problem List Patient Active Problem  List   Diagnosis Date Noted  . Lightheadedness 01/24/2014  . Nonischemic cardiomyopathy ([place]) 10/29/2013  . Chronic systolic CHF (congestive heart failure) ([place]) 04/26/2013  . Chest discomfort 04/26/2013  . Bradycardia 04/26/2013  . Automatic implantable cardioverter-defibrillator in situ 09/04/2012  . Hypokalemia   . Disability examination   . Drug therapy   . Ventricular tachyarrhythmia ([place]) 06/06/2012  . Headache(784.0)   . Sleepiness   . Mitral regurgitation   . Cardiomyopathy ([place])   . Ejection fraction < 50%   . Renal insufficiency    Thank you,  Genene Churn, [place]  [facility] 02/15/2016, 4:31 PM  [place] [address] [place], [place], [address] Phone: [phone]   Fax:  [phone]   Name: Jalene Lacko Igo MRN: [id number] Date of Birth: Oct 22, 1966

## 2016-02-17 NOTE — Progress Notes (Signed)
Remote ICD transmission.   

## 2016-02-18 ENCOUNTER — Ambulatory Visit: Payer: Medicare HMO | Admitting: Nurse Practitioner

## 2016-02-18 ENCOUNTER — Ambulatory Visit (HOSPITAL_COMMUNITY): Payer: Medicare HMO | Admitting: Speech Pathology

## 2016-02-18 DIAGNOSIS — R480 Dyslexia and alexia: Secondary | ICD-10-CM

## 2016-02-18 DIAGNOSIS — R4701 Aphasia: Secondary | ICD-10-CM | POA: Diagnosis not present

## 2016-02-18 NOTE — Therapy (Signed)
[facility] [address] [place], [place], [address] Phone: [phone]   Fax:  [phone]  Speech Language Pathology Treatment  Patient Details  Name: Adam [name] MRN: [id number] Date of Birth: 1966/10/16 Referring Provider: Alison Murray  Encounter Date: 02/18/2016      End of Session - 02/18/16 1920    Visit Number 5   Number of Visits 24   Date for SLP Re-Evaluation 04/04/16   Authorization Type [employer]   Authorization Time Period 02/04/2016-04/04/2016   SLP Start Time 1520   SLP Stop Time  1600   SLP Time Calculation (min) 40 min   Activity Tolerance Patient tolerated treatment well      Past Medical History  Diagnosis Date  . Mitral regurgitation   . NICM (nonischemic cardiomyopathy) ([facility])     Nonischemic / catheterization October, 2011, normal coronary arteries  . Chronic systolic heart failure ([facility])   . Ejection fraction < 50%     Echo 06/05/12: Mild LVH, EF 20%, posterior severe HK, anterolateral severe HK, anterior severe HK, mid to apical septal AK, AK of the true apex, apical inferior HK, restrictive physiology, trivial MR, moderate LAE, mildly reduced RV function, mild TR.  . ICD (implantable cardiac defibrillator) battery depletion 11/11/10    St. Jude, Dr.Allred  . Renal insufficiency     Creatinine 1.5 (GFR greater than 50)  . Headache(784.0)     Patient seen to have headaches from spironolactone.  Drug was stopped, August, 2012  . Sleepiness     has some sleepiness during the day  . 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 VT (ventricular tachycardia) ([facility])     VT storm 7/13 - Sotalol started  . Drug therapy     Sotalol started for VT storm  July, 2013  . Hypokalemia     There was some decrease in potassium with his admission in July, 2013. We will watch this very carefully.  . Disability examination     Discussion of disability September, 2013    Past Surgical History  Procedure  Laterality Date  . Tee with cardioversion  11/17/08    No LV clot  . Cardiac defibrillator placement  11/11/10    SJM Fortify DR ICD implanted by Dr Johney Frame    There were no vitals filed for this visit.      Subjective Assessment - 02/18/16 1919    Subjective "I am just trying to get better."   Patient is accompained by: Family member   Special Tests Portions of Western Aphasia Battery   Currently in Pain? No/denies               ADULT SLP TREATMENT - 02/18/16 1919    General Information   Behavior/Cognition Alert;Cooperative;Pleasant mood   Patient Positioning Upright in chair   Oral care provided N/A   HPI [name] is a 50 yo male from [place], [place] with a past medical history of atrial fibrillation, CHF, s/p cardiac pacemaker implantation, and gout who developed right hemiplegia and global aphasia on 01/09/2016. He was taken to [facility] where his studies showed left MCA stroke. He received IV TPA at [facility] and was airlifted to [facility] as stroke code. 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 He developed SOB and was started on BIPAP during that hospitalization. He was transferred to an inpatient rehabilitation facility on 01/15/16, but  showed seizure activity on 01/19/16 and was re-hospitalized. He was discharged home from that hospitalization on 01/20/2016. He has not received speech therapy since 01/19/16 unfortunately. He was referred by Dr. Alison Murray for outpatient SLP services due to aphasia from recent stroke. He was accompanied to the appointment by Adam [name], his wife   Treatment Provided   Treatment provided Cognitive-Linquistic   Pain Assessment   Pain Assessment No/denies pain   Cognitive-Linquistic Treatment   Treatment focused on Aphasia;Patient/family/caregiver education   Skilled Treatment pt education, compensatory strategies, word finding activities, automatic speech  drills   Assessment / Recommendations / Plan   Plan Continue with current plan of care   Progression Toward Goals   Progression toward goals Progressing toward goals            SLP Short Term Goals - 02/18/16 1928    SLP SHORT TERM GOAL #1   Title Pt will increase naming of common objects/pictures to 90% acc when provided with mod/max multimodality cues    Baseline 72%   Time 8   Period Weeks   Status On-going   SLP SHORT TERM GOAL #2   Title Pt will complete single word sentence completion tasks (SWSC) with 90% acc when provided with mod multimodality cues.   Baseline 70%   Time 8   Period Weeks   Status On-going   SLP SHORT TERM GOAL #3   Title Pt will increase divergent naming to 5 items per concrete category with mod cues.   Baseline 1-2   Time 8   Period Weeks   Status On-going   SLP SHORT TERM GOAL #4   Title Pt will complete basic level reading comprehension tasks (picture cues for sentence level) with 70% acc with provided mod cues.   Baseline 25%   Time 8   Period Weeks   Status On-going   SLP SHORT TERM GOAL #5   Title Pt will answer moderate level auditory comprehension yes/no questions with 80% acc when provided mod cues.   Baseline 65%   Time 8   Period Weeks   Status On-going   SLP SHORT TERM GOAL #6   Title Pt will be able to write personal/bio information with 90% acc when provided mod cues   Baseline name only   Time 8   Period Weeks   Status On-going          SLP Long Term Goals - 02/18/16 1928    SLP LONG TERM GOAL #1   Title Pt will communicate moderate level wants/needs/thoughts to family and friends with use of multimodality communication strategies as needed.    Baseline mod/severe impairment   Time 3   Period Months   Status On-going          Plan - 02/18/16 1921    Clinical Impression Statement Adam [name] brought his binder today and had completed several pages of homework. He completed reading comprehension and word  finding tasks with moderate cues from SLP. He was able to orally read single words with 73% acc with mild/moderate amount of perseveration. He demonstrated improved awareness of perseverative errors, by immediately verbalizing, "no", but had difficulty breaking perseveration without SLP assist. He was able to identify appropriate words in concrete category with 80% acc when provided min cues. SLP introduced oral reading of single words when paired with the picture and pt read with 90% acc; when shown just the words (no picture), he completed with 85% acc but required increased time and  allowance for self corrections. SLP presented word repetition tasks with interjected confounds and pt able to inhibit perseverations 90% of the time. SLP reinforced his excellent progress at the end of the session and shared with his fiance. Continue plan of care.   Speech Therapy Frequency 3x / week   Duration --  8 weeks   Treatment/Interventions Language facilitation;Compensatory techniques;SLP instruction and feedback;Cueing hierarchy;Patient/family education;Compensatory strategies;Multimodal communcation approach;Functional tasks   Potential to Achieve Goals Good   Potential Considerations Severity of impairments   SLP Home Exercise Plan Pt will be independent with HEP to facilitate carryover of treatment strategies and techniques in home/community environment with assist from wife as needed.    Consulted and Agree with Plan of Care Patient;Family member/caregiver   Family Member Consulted Wife, Adam [name]      Patient will benefit from skilled therapeutic intervention in order to improve the following deficits and impairments:   Aphasia  Dyslexia and alexia    Problem List Patient Active Problem List   Diagnosis Date Noted  . Lightheadedness 01/24/2014  . Nonischemic cardiomyopathy ([facility]) 10/29/2013  . Chronic systolic CHF (congestive heart failure) ([facility]) 04/26/2013  . Chest discomfort 04/26/2013   . Bradycardia 04/26/2013  . Automatic implantable cardioverter-defibrillator in situ 09/04/2012  . Hypokalemia   . Disability examination   . Drug therapy   . Ventricular tachyarrhythmia ([facility]) 06/06/2012  . Headache(784.0)   . Sleepiness   . Mitral regurgitation   . Cardiomyopathy ([facility])   . Ejection fraction < 50%   . Renal insufficiency    Thank you,  Havery Moros, CCC-SLP [phone]  [facility] 02/18/2016, 7:29 PM  [facility] [address] [place], [place], [address] Phone: [phone]   Fax:  [phone]   Name: Adam [name] MRN: [id number] Date of Birth: Mar 13, 1966

## 2016-02-24 ENCOUNTER — Ambulatory Visit (HOSPITAL_COMMUNITY): Payer: Medicare HMO | Admitting: Speech Pathology

## 2016-02-24 DIAGNOSIS — R480 Dyslexia and alexia: Secondary | ICD-10-CM

## 2016-02-24 DIAGNOSIS — R4701 Aphasia: Secondary | ICD-10-CM

## 2016-02-24 DIAGNOSIS — R488 Other symbolic dysfunctions: Secondary | ICD-10-CM

## 2016-02-24 NOTE — Therapy (Signed)
[facility] [address] [place], [place], [address] Phone: [phone]   Fax:  [phone]  Speech Language Pathology Treatment  Patient Details  Name: Adam [name] MRN: [id number] Date of Birth: [birth year] Referring Provider: Alison Murray  Encounter Date: 02/24/2016      End of Session - 02/24/16 1357    Visit Number 6   Number of Visits 24   Date for SLP Re-Evaluation 04/04/16   Authorization Type [employer]   Authorization Time Period 02/04/2016-04/04/2016   SLP Start Time 1122   SLP Stop Time  1210   SLP Time Calculation (min) 48 min   Activity Tolerance Patient tolerated treatment well      Past Medical History  Diagnosis Date  . Mitral regurgitation   . NICM (nonischemic cardiomyopathy) (HCC)     Nonischemic / catheterization October, 2011, normal coronary arteries  . Chronic systolic heart failure (HCC)   . Ejection fraction < 50%     Echo 06/05/12: Mild LVH, EF 20%, posterior severe HK, anterolateral severe HK, anterior severe HK, mid to apical septal AK, AK of the true apex, apical inferior HK, restrictive physiology, trivial MR, moderate LAE, mildly reduced RV function, mild TR.  . ICD (implantable cardiac defibrillator) battery depletion 11/11/10    St. Jude, Dr.Allred  . Renal insufficiency     Creatinine 1.5 (GFR greater than 50)  . Headache(784.0)     Patient seen to have headaches from spironolactone.  Drug was stopped, August, 2012  . Sleepiness     has some sleepiness during the day  . 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 VT (ventricular tachycardia) (HCC)     VT storm 7/13 - Sotalol started  . Drug therapy     Sotalol started for VT storm  July, 2013  . Hypokalemia     There was some decrease in potassium with his admission in July, 2013. We will watch this very carefully.  . Disability examination     Discussion of disability September, 2013    Past Surgical History  Procedure  Laterality Date  . Tee with cardioversion  11/17/08    No LV clot  . Cardiac defibrillator placement  11/11/10    SJM Fortify DR ICD implanted by Dr Johney Frame    There were no vitals filed for this visit.      Subjective Assessment - 02/24/16 1355    Subjective "I just think my hand is weaker." (right hand during writing task)   Patient is accompained by: Family member   Special Tests Portions of Western Aphasia Battery   Currently in Pain? No/denies               ADULT SLP TREATMENT - 02/24/16 1356    General Information   Behavior/Cognition Alert;Cooperative;Pleasant mood   Patient Positioning Upright in chair   Oral care provided N/A   HPI [name] is a 50 yo male from [place], [place] with a past medical history of atrial fibrillation, CHF, s/p cardiac pacemaker implantation, and gout who developed right hemiplegia and global aphasia on 01/09/2016. He was taken to [facility] where his studies showed left MCA stroke. He received IV TPA at [facility] and was airlifted to [facility] as stroke code. 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 He developed SOB and was started on BIPAP during that hospitalization. He was transferred to an inpatient  rehabilitation facility on 01/15/16, but showed seizure activity on 01/19/16 and was re-hospitalized. He was discharged home from that hospitalization on 01/20/2016. He has not received speech therapy since 01/19/16 unfortunately. He was referred by Dr. Alison Murray for outpatient SLP services due to aphasia from recent stroke. He was accompanied to the appointment by [name], his wife   Treatment Provided   Treatment provided Cognitive-Linquistic   Pain Assessment   Pain Assessment No/denies pain   Cognitive-Linquistic Treatment   Treatment focused on Aphasia;Patient/family/caregiver education   Skilled Treatment pt education, compensatory strategies, word finding  activities, automatic speech drills   Assessment / Recommendations / Plan   Plan Continue with current plan of care   Progression Toward Goals   Progression toward goals Progressing toward goals          SLP Education - 02/24/16 1356    Education provided Yes   Education Details Discussed progress; pt/wife also expressed weakness in right hand and leg- will obtain order for PT/OT eval   Person(s) Educated Patient;Spouse   Methods Explanation   Comprehension Verbalized understanding          SLP Short Term Goals - 02/24/16 1407    SLP SHORT TERM GOAL #1   Title Pt will increase naming of common objects/pictures to 90% acc when provided with mod/max multimodality cues    Baseline 72%   Time 8   Period Weeks   Status On-going   SLP SHORT TERM GOAL #2   Title Pt will complete single word sentence completion tasks (SWSC) with 90% acc when provided with mod multimodality cues.   Baseline 70%   Time 8   Period Weeks   Status On-going   SLP SHORT TERM GOAL #3   Title Pt will increase divergent naming to 5 items per concrete category with mod cues.   Baseline 1-2   Time 8   Period Weeks   Status On-going   SLP SHORT TERM GOAL #4   Title Pt will complete basic level reading comprehension tasks (picture cues for sentence level) with 70% acc with provided mod cues.   Baseline 25%   Time 8   Period Weeks   Status On-going   SLP SHORT TERM GOAL #5   Title Pt will answer moderate level auditory comprehension yes/no questions with 80% acc when provided mod cues.   Baseline 65%   Time 8   Period Weeks   Status On-going   SLP SHORT TERM GOAL #6   Title Pt will be able to write personal/bio information with 90% acc when provided mod cues   Baseline name only   Time 8   Period Weeks   Status On-going          SLP Long Term Goals - 02/24/16 1407    SLP LONG TERM GOAL #1   Title Pt will communicate moderate level wants/needs/thoughts to family and friends with use of  multimodality communication strategies as needed.    Baseline mod/severe impairment   Time 3   Period Months   Status On-going          Plan - 02/24/16 1358    Clinical Impression Statement Mr. Mcelrath was alert and cooperative for SLP session this date; mood appears good. Homeword was reviewed. Pt was completed sentence level reading comprehension tasks in single word sentence completion form with f=3 word bank with 100% acc. He only wrote/copied 1 of the 20 words however and pt reports weakness in right hand. This  was discussed with pt/wife who endorse weakness in right hand and leg. SLP will ask MD for referral for outpatient OT/PT evaluation and pt/spouse in agreement. Pt asked to orally read sentences aloud and SLP provided visual cues by blocking extraneous text with paper. Pt did experience perseveration of utterances, but seemed to be aware ~80% of the time. He required mod/max cues to break perseveration during task. Pt is unable to sound out words at this time and appears to read by sight. His wife wrote list of family members and this was reviewed with pt and he was encouraged to review at home. SLP asked Mr. Mier closed ended questions regarding personal favorites and he verbalized appropriate response 88% of the time with min cues. Mr. Hibler continues to make excellent progress; continue POC.    Speech Therapy Frequency 3x / week   Duration --  8 weeks   Treatment/Interventions Language facilitation;Compensatory techniques;SLP instruction and feedback;Cueing hierarchy;Patient/family education;Compensatory strategies;Multimodal communcation approach;Functional tasks   Potential to Achieve Goals Good   Potential Considerations Severity of impairments   SLP Home Exercise Plan Pt will be independent with HEP to facilitate carryover of treatment strategies and techniques in home/community environment with assist from wife as needed.    Consulted and Agree with Plan of Care Patient;Family  member/caregiver   Family Member Consulted Wife, [name]      Patient will benefit from skilled therapeutic intervention in order to improve the following deficits and impairments:   Aphasia  Dyslexia and alexia  Agraphia    Problem List Patient Active Problem List   Diagnosis Date Noted  . Lightheadedness 01/24/2014  . Nonischemic cardiomyopathy (HCC) 10/29/2013  . Chronic systolic CHF (congestive heart failure) (HCC) 04/26/2013  . Chest discomfort 04/26/2013  . Bradycardia 04/26/2013  . Automatic implantable cardioverter-defibrillator in situ 09/04/2012  . Hypokalemia   . Disability examination   . Drug therapy   . Ventricular tachyarrhythmia (HCC) 06/06/2012  . Headache(784.0)   . Sleepiness   . Mitral regurgitation   . Cardiomyopathy (HCC)   . Ejection fraction < 50%   . Renal insufficiency    Thank you,  Havery Moros, CCC-SLP [phone]  [facility] 02/24/2016, 2:08 PM  [facility] [address] [place], [place], [address] Phone: [phone]   Fax:  [phone]   Name: Midas Daughety Moulder MRN: [id number] Date of Birth: October 05, 1966

## 2016-02-29 ENCOUNTER — Ambulatory Visit (HOSPITAL_COMMUNITY): Payer: Medicare HMO | Admitting: Speech Pathology

## 2016-02-29 ENCOUNTER — Encounter (HOSPITAL_COMMUNITY): Payer: Self-pay | Admitting: Speech Pathology

## 2016-02-29 DIAGNOSIS — R480 Dyslexia and alexia: Secondary | ICD-10-CM

## 2016-02-29 DIAGNOSIS — R4701 Aphasia: Secondary | ICD-10-CM | POA: Diagnosis not present

## 2016-02-29 DIAGNOSIS — R488 Other symbolic dysfunctions: Secondary | ICD-10-CM
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
## 2016-03-07 ENCOUNTER — Ambulatory Visit (HOSPITAL_COMMUNITY): Payer: Medicare HMO | Attending: Internal Medicine | Admitting: Speech Pathology

## 2016-03-07 DIAGNOSIS — R2681 Unsteadiness on feet: Secondary | ICD-10-CM | POA: Diagnosis present

## 2016-03-07 DIAGNOSIS — R488 Other symbolic dysfunctions: Secondary | ICD-10-CM

## 2016-03-07 DIAGNOSIS — R278 Other lack of coordination: Secondary | ICD-10-CM | POA: Diagnosis present

## 2016-03-07 DIAGNOSIS — Z7409 Other reduced mobility: Secondary | ICD-10-CM | POA: Diagnosis present

## 2016-03-07 DIAGNOSIS — R29898 Other symptoms and signs involving the musculoskeletal system: Secondary | ICD-10-CM | POA: Diagnosis present

## 2016-03-07 DIAGNOSIS — I639 Cerebral infarction, unspecified: Secondary | ICD-10-CM | POA: Diagnosis present

## 2016-03-07 DIAGNOSIS — R4701 Aphasia: Secondary | ICD-10-CM

## 2016-03-07 DIAGNOSIS — M6281 Muscle weakness (generalized): Secondary | ICD-10-CM | POA: Insufficient documentation

## 2016-03-07 DIAGNOSIS — R480 Dyslexia and alexia: Secondary | ICD-10-CM

## 2016-03-07 DIAGNOSIS — I69359 Hemiplegia and hemiparesis following cerebral infarction affecting unspecified side: Secondary | ICD-10-CM | POA: Diagnosis present

## 2016-03-07 NOTE — Therapy (Signed)
[place] [facility] [address] [place], [place], [address] Phone: [phone]   Fax:  [phone]  Speech Language Pathology Treatment  Patient Details  Name: Adam [name] MRN: [id number] Date of Birth: January 28, 1966 Referring Provider: Alison Murray  Encounter Date: 03/07/2016      End of Session - 03/07/16 1531    Visit Number 8   Number of Visits 24   Date for SLP Re-Evaluation 04/04/16   Authorization Type [employer]   Authorization Time Period 02/04/2016-04/04/2016   SLP Start Time 1345   SLP Stop Time  1430   SLP Time Calculation (min) 45 min   Activity Tolerance Patient tolerated treatment well      Past Medical History  Diagnosis Date  . Mitral regurgitation   . NICM (nonischemic cardiomyopathy) (HCC)     Nonischemic / catheterization October, 2011, normal coronary arteries  . Chronic systolic heart failure (HCC)   . Ejection fraction < 50%     Echo 06/05/12: Mild LVH, EF 20%, posterior severe HK, anterolateral severe HK, anterior severe HK, mid to apical septal AK, AK of the true apex, apical inferior HK, restrictive physiology, trivial MR, moderate LAE, mildly reduced RV function, mild TR.  . ICD (implantable cardiac defibrillator) battery depletion 11/11/10    [employer] Jude, Dr.Allred  . Renal insufficiency     Creatinine 1.5 (GFR greater than 50)  . Headache(784.0)     Patient seen to have headaches from spironolactone.  Drug was stopped, August, 2012  . Sleepiness     has some sleepiness during the day  . 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 VT (ventricular tachycardia) (HCC)     VT storm 7/13 - Sotalol started  . Drug therapy     Sotalol started for VT storm  July, 2013  . Hypokalemia     There was some decrease in potassium with his admission in July, 2013. We will watch this very carefully.  . Disability examination     Discussion of disability September, 2013    Past Surgical History  Procedure  Laterality Date  . Tee with cardioversion  11/17/08    No LV clot  . Cardiac defibrillator placement  11/11/10    SJM Fortify DR ICD implanted by Dr Johney Frame    There were no vitals filed for this visit.      Subjective Assessment - 03/07/16 1530    Subjective "Well, it is moving slowly."   Patient is accompained by: Family member   Currently in Pain? No/denies               ADULT SLP TREATMENT - 03/07/16 1530    General Information   Behavior/Cognition Alert;Cooperative;Pleasant mood   Patient Positioning Upright in chair   Oral care provided N/A   HPI [name] is a 50 yo male from [place], [place] with a past medical history of atrial fibrillation, CHF, s/p cardiac pacemaker implantation, and gout who developed right hemiplegia and global aphasia on 01/09/2016. He was taken to [facility] where his studies showed left MCA stroke. He received IV TPA at [facility] and was airlifted to [facility] as stroke code. 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 He developed SOB and was started on BIPAP during that hospitalization. He was transferred to an inpatient rehabilitation facility on 01/15/16, but showed seizure activity on 01/19/16 and was re-hospitalized. He was discharged  home from that hospitalization on 01/20/2016. He has not received speech therapy since 01/19/16 unfortunately. He was referred by Dr. Alison Murray for outpatient SLP services due to aphasia from recent stroke. He was accompanied to the appointment by [name], his wife   Treatment Provided   Treatment provided Cognitive-Linquistic   Pain Assessment   Pain Assessment No/denies pain   Cognitive-Linquistic Treatment   Treatment focused on Aphasia;Patient/family/caregiver education   Skilled Treatment pt education, compensatory strategies, word finding activities, automatic speech drills   Assessment / Recommendations / Plan   Plan Continue  with current plan of care   Progression Toward Goals   Progression toward goals Progressing toward goals            SLP Short Term Goals - 03/07/16 1532    SLP SHORT TERM GOAL #1   Title Pt will increase naming of common objects/pictures to 90% acc when provided with mod/max multimodality cues    Baseline 72%   Time 8   Period Weeks   Status On-going   SLP SHORT TERM GOAL #2   Title Pt will complete single word sentence completion tasks (SWSC) with 90% acc when provided with mod multimodality cues.   Baseline 70%   Time 8   Period Weeks   Status On-going   SLP SHORT TERM GOAL #3   Title Pt will increase divergent naming to 5 items per concrete category with mod cues.   Baseline 1-2   Time 8   Period Weeks   Status On-going   SLP SHORT TERM GOAL #4   Title Pt will complete basic level reading comprehension tasks (picture cues for sentence level) with 70% acc with provided mod cues.   Baseline 25%   Time 8   Period Weeks   Status On-going   SLP SHORT TERM GOAL #5   Title Pt will answer moderate level auditory comprehension yes/no questions with 80% acc when provided mod cues.   Baseline 65%   Time 8   Period Weeks   Status On-going   SLP SHORT TERM GOAL #6   Title Pt will be able to write personal/bio information with 90% acc when provided mod cues   Baseline name only   Time 8   Period Weeks   Status On-going          SLP Long Term Goals - 03/07/16 1532    SLP LONG TERM GOAL #1   Title Pt will communicate moderate level wants/needs/thoughts to family and friends with use of multimodality communication strategies as needed.    Baseline mod/severe impairment   Time 3   Period Months   Status On-going          Plan - 03/07/16 1531    Clinical Impression Statement Pt demonstrated increased verbal fluency today as evidenced by participating in conversation with SLP regarding NBA basketball tournament. When SLP started to cue him regarding a basketball  team, he stopped me and said, "what a minute" while he worked to find the word. When unable, SLP pointed to written choices used from previous session and he effectively named the team. He completed confrontation naming task with 100% acc when provided min cues. He spelled single words with letter space cue and 50% of letter provided with 80% acc when given mild/mod cues. He tended to have more difficulty with vowels and "ch". He was able to spell to dictation with 96% acc. Additional homework provided. He continues to make excellent progress. Continue poc.  Speech Therapy Frequency 3x / week   Duration --  8 weeks   Treatment/Interventions Language facilitation;Compensatory techniques;SLP instruction and feedback;Cueing hierarchy;Patient/family education;Compensatory strategies;Multimodal communcation approach;Functional tasks   Potential to Achieve Goals Good   Potential Considerations Severity of impairments   SLP Home Exercise Plan Pt will be independent with HEP to facilitate carryover of treatment strategies and techniques in home/community environment with assist from wife as needed.    Consulted and Agree with Plan of Care Patient;Family member/caregiver   Family Member Consulted Wife, [name]      Patient will benefit from skilled therapeutic intervention in order to improve the following deficits and impairments:   Aphasia  Dyslexia and alexia  Agraphia    Problem List Patient Active Problem List   Diagnosis Date Noted  . Lightheadedness 01/24/2014  . Nonischemic cardiomyopathy (HCC) 10/29/2013  . Chronic systolic CHF (congestive heart failure) (HCC) 04/26/2013  . Chest discomfort 04/26/2013  . Bradycardia 04/26/2013  . Automatic implantable cardioverter-defibrillator in situ 09/04/2012  . Hypokalemia   . Disability examination   . Drug therapy   . Ventricular tachyarrhythmia (HCC) 06/06/2012  . Headache(784.0)   . Sleepiness   . Mitral regurgitation   .  Cardiomyopathy (HCC)   . Ejection fraction < 50%   . Renal insufficiency     Thank you,  Havery Moros, CCC-SLP [phone]   [facility] 03/07/2016, 3:32 PM  [place] [facility] [address] [place], [place], [address] Phone: [phone]   Fax:  [phone]   Name: Mikale Silversmith Daddario MRN: [id number] Date of Birth: 23-Oct-1966

## 2016-03-08 ENCOUNTER — Telehealth: Payer: Self-pay

## 2016-03-08 NOTE — Telephone Encounter (Signed)
Call to patient.  Patients significant other answered phone and stated patient unable to talk due to stroke.  She stated she is listed as his contact person.  Advised I would call her back to check for DPR on file.

## 2016-03-09 NOTE — Telephone Encounter (Signed)
Call back to significant other and advised no DPR on file.  Explained I would mail form to patient and requested to complete and mail back to [facility], address provided.  No verbal response and apologized for any inconvenience.  Unable to review ICM transmission due to unable to speak with member.  ICM transmission rescheduled for 03/30/2016 to allow time of return DPR form.

## 2016-03-10 ENCOUNTER — Ambulatory Visit (HOSPITAL_COMMUNITY): Payer: Medicare HMO | Admitting: Speech Pathology

## 2016-03-10 ENCOUNTER — Ambulatory Visit (HOSPITAL_COMMUNITY): Payer: Medicare HMO

## 2016-03-10 ENCOUNTER — Ambulatory Visit (HOSPITAL_COMMUNITY): Payer: Medicare HMO | Admitting: Occupational Therapy

## 2016-03-10 ENCOUNTER — Encounter (HOSPITAL_COMMUNITY): Payer: Self-pay | Admitting: Occupational Therapy

## 2016-03-10 DIAGNOSIS — R480 Dyslexia and alexia: Secondary | ICD-10-CM

## 2016-03-10 DIAGNOSIS — R29898 Other symptoms and signs involving the musculoskeletal system: Secondary | ICD-10-CM

## 2016-03-10 DIAGNOSIS — R4701 Aphasia: Secondary | ICD-10-CM

## 2016-03-10 DIAGNOSIS — R2681 Unsteadiness on feet: Secondary | ICD-10-CM

## 2016-03-10 DIAGNOSIS — R488 Other symbolic dysfunctions: Secondary | ICD-10-CM

## 2016-03-10 DIAGNOSIS — I69359 Hemiplegia and hemiparesis following cerebral infarction affecting unspecified side: Secondary | ICD-10-CM

## 2016-03-10 DIAGNOSIS — R278 Other lack of coordination: Secondary | ICD-10-CM

## 2016-03-10 DIAGNOSIS — I639 Cerebral infarction, unspecified: Secondary | ICD-10-CM

## 2016-03-10 DIAGNOSIS — M6281 Muscle weakness (generalized): Secondary | ICD-10-CM

## 2016-03-10 DIAGNOSIS — Z7409 Other reduced mobility: Secondary | ICD-10-CM

## 2016-03-10 NOTE — Therapy (Signed)
[place] [facility] [address] [place], [place], [address] Phone: [phone]   Fax:  [phone]  Speech Language Pathology Treatment  Patient Details  Name: Adam [name] MRN: [id number] Date of Birth: 1966-05-12 Referring Provider: Alison Murray  Encounter Date: 03/10/2016      End of Session - 03/10/16 1818    Visit Number 9   Number of Visits 24   Date for SLP Re-Evaluation 04/04/16   Authorization Type [employer]   Authorization Time Period 02/04/2016-04/04/2016   SLP Start Time 1440   SLP Stop Time  1522   SLP Time Calculation (min) 42 min   Activity Tolerance Patient tolerated treatment well      Past Medical History  Diagnosis Date  . Mitral regurgitation   . NICM (nonischemic cardiomyopathy) (HCC)     Nonischemic / catheterization October, 2011, normal coronary arteries  . Chronic systolic heart failure (HCC)   . Ejection fraction < 50%     Echo 06/05/12: Mild LVH, EF 20%, posterior severe HK, anterolateral severe HK, anterior severe HK, mid to apical septal AK, AK of the true apex, apical inferior HK, restrictive physiology, trivial MR, moderate LAE, mildly reduced RV function, mild TR.  . ICD (implantable cardiac defibrillator) battery depletion 11/11/10    [employer]. Jude, Dr.Allred  . Renal insufficiency     Creatinine 1.5 (GFR greater than 50)  . Headache(784.0)     Patient seen to have headaches from spironolactone.  Drug was stopped, August, 2012  . Sleepiness     has some sleepiness during the day  . 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 VT (ventricular tachycardia) (HCC)     VT storm 7/13 - Sotalol started  . Drug therapy     Sotalol started for VT storm  July, 2013  . Hypokalemia     There was some decrease in potassium with his admission in July, 2013. We will watch this very carefully.  . Disability examination     Discussion of disability September, 2013    Past Surgical History  Procedure  Laterality Date  . Tee with cardioversion  11/17/08    No LV clot  . Cardiac defibrillator placement  11/11/10    SJM Fortify DR ICD implanted by Dr Johney Frame    There were no vitals filed for this visit.      Subjective Assessment - 03/10/16 1817    Subjective "I have the gout."   Patient is accompained by: Family member   Currently in Pain? No/denies               ADULT SLP TREATMENT - 03/10/16 1818    General Information   Behavior/Cognition Alert;Cooperative;Pleasant mood   Patient Positioning Upright in chair   Oral care provided N/A   HPI [name] is a 50 yo male from [place], [place] with a past medical history of atrial fibrillation, CHF, s/p cardiac pacemaker implantation, and gout who developed right hemiplegia and global aphasia on 01/09/2016. He was taken to [facility] where his studies showed left MCA stroke. He received IV TPA at [facility] and was airlifted to [facility] as stroke code. 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 He developed SOB and was started on BIPAP during that hospitalization. He was transferred to an inpatient rehabilitation facility on 01/15/16, but showed seizure activity on 01/19/16 and was re-hospitalized. He was discharged home  from that hospitalization on 01/20/2016. He has not received speech therapy since 01/19/16 unfortunately. He was referred by Dr. Alison Murray for outpatient SLP services due to aphasia from recent stroke. He was accompanied to the appointment by [name], his wife   Treatment Provided   Treatment provided Cognitive-Linquistic   Pain Assessment   Pain Assessment No/denies pain   Cognitive-Linquistic Treatment   Treatment focused on Aphasia;Patient/family/caregiver education   Skilled Treatment pt education, compensatory strategies, word finding activities, automatic speech drills   Assessment / Recommendations / Plan   Plan Continue with  current plan of care   Progression Toward Goals   Progression toward goals Progressing toward goals            SLP Short Term Goals - 03/10/16 1819    SLP SHORT TERM GOAL #1   Title Pt will increase naming of common objects/pictures to 90% acc when provided with mod/max multimodality cues    Baseline 72%   Time 8   Period Weeks   Status On-going   SLP SHORT TERM GOAL #2   Title Pt will complete single word sentence completion tasks (SWSC) with 90% acc when provided with mod multimodality cues.   Baseline 70%   Time 8   Period Weeks   Status On-going   SLP SHORT TERM GOAL #3   Title Pt will increase divergent naming to 5 items per concrete category with mod cues.   Baseline 1-2   Time 8   Period Weeks   Status On-going   SLP SHORT TERM GOAL #4   Title Pt will complete basic level reading comprehension tasks (picture cues for sentence level) with 70% acc with provided mod cues.   Baseline 25%   Time 8   Period Weeks   Status On-going   SLP SHORT TERM GOAL #5   Title Pt will answer moderate level auditory comprehension yes/no questions with 80% acc when provided mod cues.   Baseline 65%   Time 8   Period Weeks   Status On-going   SLP SHORT TERM GOAL #6   Title Pt will be able to write personal/bio information with 90% acc when provided mod cues   Baseline name only   Time 8   Period Weeks   Status On-going          SLP Long Term Goals - 03/10/16 1819    SLP LONG TERM GOAL #1   Title Pt will communicate moderate level wants/needs/thoughts to family and friends with use of multimodality communication strategies as needed.    Baseline mod/severe impairment   Time 3   Period Months   Status On-going          Plan - 03/10/16 1819    Clinical Impression Statement Pt reports being bothered by gout in his right ankle today. Confrontation naming, generational naming for concrete categories, conversational speech, and spelling were targeted today. He completed  basic level confrontation naming with 100% acc independently. He was able to spell single words when asked to complete 3/5 letters with 80% acc when provided mod cues. He had more difficulty with concrete generational naming task and was able to name an average of 3 items per category with min cues. SLP provided NBA bracket and assisted pt in filling it in with moderate cues. He was asked to update the bracket for homework. Continue plan of care.   Speech Therapy Frequency 3x / week   Duration --  8 weeks   Treatment/Interventions Language  facilitation;Compensatory techniques;SLP instruction and feedback;Cueing hierarchy;Patient/family education;Compensatory strategies;Multimodal communcation approach;Functional tasks   Potential to Achieve Goals Good   Potential Considerations Severity of impairments   SLP Home Exercise Plan Pt will be independent with HEP to facilitate carryover of treatment strategies and techniques in home/community environment with assist from wife as needed.    Consulted and Agree with Plan of Care Patient;Family member/caregiver   Family Member Consulted Wife, [name]      Patient will benefit from skilled therapeutic intervention in order to improve the following deficits and impairments:   Aphasia  Dyslexia and alexia  Agraphia    Problem List Patient Active Problem List   Diagnosis Date Noted  . Lightheadedness 01/24/2014  . Nonischemic cardiomyopathy (HCC) 10/29/2013  . Chronic systolic CHF (congestive heart failure) (HCC) 04/26/2013  . Chest discomfort 04/26/2013  . Bradycardia 04/26/2013  . Automatic implantable cardioverter-defibrillator in situ 09/04/2012  . Hypokalemia   . Disability examination   . Drug therapy   . Ventricular tachyarrhythmia (HCC) 06/06/2012  . Headache(784.0)   . Sleepiness   . Mitral regurgitation   . Cardiomyopathy (HCC)   . Ejection fraction < 50%   . Renal insufficiency    Thank you,  Havery Moros,  CCC-SLP [phone]   [facility] 03/10/2016, 6:20 PM  [place] [facility] [address] [place], [place], [address] Phone: [phone]   Fax:  [phone]   Name: Tahir Blank Umanzor MRN: [id number] Date of Birth: 1966/06/22

## 2016-03-10 NOTE — Therapy (Signed)
[facility] [address] [place], [place], [address] Phone: [phone]   Fax:  [phone]  Physical Therapy Evaluation  Patient Details  Name: Rickie Coye Kronberg MRN: [id number] Date of Birth: [birth year] Referring Provider: Kirstie Peri  Encounter Date: 03/10/2016      PT End of Session - 03/10/16 2029    Visit Number 1   Number of Visits 16   Date for PT Re-Evaluation 04/10/16   Authorization Type Medicaid   Authorization Time Period 03/10/16-05/10/16   Authorization - Visit Number 1   PT Start Time 1350   PT Stop Time 1446   PT Time Calculation (min) 56 min   Equipment Utilized During Treatment Gait belt   Activity Tolerance Patient tolerated treatment well   Behavior During Therapy [facility] for tasks assessed/performed      Past Medical History  Diagnosis Date  . Mitral regurgitation   . NICM (nonischemic cardiomyopathy) (HCC)     Nonischemic / catheterization October, 2011, normal coronary arteries  . Chronic systolic heart failure (HCC)   . Ejection fraction < 50%     Echo 06/05/12: Mild LVH, EF 20%, posterior severe HK, anterolateral severe HK, anterior severe HK, mid to apical septal AK, AK of the true apex, apical inferior HK, restrictive physiology, trivial MR, moderate LAE, mildly reduced RV function, mild TR.  . ICD (implantable cardiac defibrillator) battery depletion 11/11/10    [employer], Dr.Allred  . Renal insufficiency     Creatinine 1.5 (GFR greater than 50)  . Headache(784.0)     Patient seen to have headaches from spironolactone.  Drug was stopped, August, 2012  . Sleepiness     has some sleepiness during the day  . 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 VT (ventricular tachycardia) (HCC)     VT storm 7/13 - Sotalol started  . Drug therapy     Sotalol started for VT storm  July, 2013  . Hypokalemia     There was some decrease in potassium with his admission in July, 2013. We will watch this very  carefully.  . Disability examination     Discussion of disability September, 2013    Past Surgical History  Procedure Laterality Date  . Tee with cardioversion  11/17/08    No LV clot  . Cardiac defibrillator placement  11/11/10    SJM Fortify DR ICD implanted by Dr Johney Frame    There were no vitals filed for this visit.       Subjective Assessment - 03/10/16 2003    Subjective Patient sustained a CVA in the first week of March. He reports some RUE fatigue/fine motor control impairment and some instability with gait and balance, but reporting that his BLE are more effected by recurrent gout pain. The patient also experienced some expressive aphasia as a result of said stroke. The patient reports that he sustained a CVA about 4 years ago, wherein he experienced some L sided hemiplegia, and he took PT for 8 months and reports a full recovery.  The patient started SLP about 1 month ago and is starting PT/OT today. He says his speech is imporving but has patent word finding difficulty throught the session.    Pertinent History Prior CVA, extensive cardiac history.    Limitations Walking   How long can you sit comfortably? Unlimitied   How long can you stand comfortably? Unsure   How long can you walk comfortably? household distances only.    Currently  in Pain? No/denies            [facility] PT Assessment - 03/10/16 0001    Assessment   Medical Diagnosis s/p CVA, R hemiplegia    Referring Provider Kirstie Peri   Onset Date/Surgical Date 02/09/16   Hand Dominance Right   Prior Therapy 4 years ago   different stroke   Balance Screen   Has the patient fallen in the past 6 months No   Has the patient had a decrease in activity level because of a fear of falling?  No   Is the patient reluctant to leave their home because of a fear of falling?  No   Prior Function   Level of Independence Independent with household mobility with device;Independent with basic ADLs   Berg Balance Test   Sit to  Stand (p) Able to stand without using hands and stabilize independently   Standing Unsupported (p) Able to stand safely 2 minutes   Sitting with Back Unsupported but Feet Supported on Floor or Stool (p) Able to sit safely and securely 2 minutes   Stand to Sit (p) Sits safely with minimal use of hands   Transfers (p) Able to transfer safely, minor use of hands   Standing Unsupported with Eyes Closed (p) Able to stand 10 seconds safely   Standing Ubsupported with Feet Together (p) Able to place feet together independently and stand 1 minute safely   From Standing, Reach Forward with Outstretched Arm (p) Can reach confidently >25 cm (10")   From Standing Position, Pick up Object from Floor (p) Able to pick up shoe safely and easily   From Standing Position, Turn to Look Behind Over each Shoulder (p) Looks behind from both sides and weight shifts well   Turn 360 Degrees (p) Able to turn 360 degrees safely but slowly   Standing Unsupported, Alternately Place Feet on Step/Stool (p) Able to stand independently and complete 8 steps >20 seconds   Standing Unsupported, One Foot in Front (p) Able to take small step independently and hold 30 seconds   Standing on One Leg (p) Tries to lift leg/unable to hold 3 seconds but remains standing independently   Total Score (p) 48      TUG Test: 16s  98M Walk Test: 0.73m/s  SLS: R: 2s; L: 2s  5x STS: 27.8s  MMT:   Elbow Flexion: 5/5 bilat, 25% weakness on R  Elbow Extension: 5/5 bilat, 25-50% weakness on R   Hip Flexion: 5/5 Bilat  Knee Extension: 5/5 Bilat  Knee Flexion: L: 5/5; R:3+/5  Ankle Dorsiflexion: L:4+/5; R: 3/5   Ankle Plantarflexion (SLS heel raise): L: 20x; R: 0x  Hip Horizontal Abduction: 5/5 bilat  Hip Abduction: Not tested this session  Light Touch Sensation:   Appears intact.        PT Education - 03/10/16 2028    Education provided Yes   Education Details Explained the importance of frequent ambulation to help restore  overall function.    Person(s) Educated Patient   Methods Explanation   Comprehension Verbalized understanding          PT Short Term Goals - 03/10/16 2042    PT SHORT TERM GOAL #1   Title After 2 weeks pt will be indep in HEP.    PT SHORT TERM GOAL #2   Title After 4 weeks pt will demonstrate improved functional strength/mobility in 5x STS<18, TUG Test<13, and Gait speed>10.m/s.     PT SHORT TERM GOAL #3  Title After 4 weeks pt will demonstrate improved balance with BBT>53.    PT SHORT TERM GOAL #4   Title After 4 weeks pt will demonstrate >3+/5 strength in RLE.            PT Long Term Goals - 03/10/16 2041    PT LONG TERM GOAL #1   Title After 7 weeks pt will be indep in advanced HEP for DC.    PT LONG TERM GOAL #2   Title After 8 weeks pt will demonstrate improved functional strength/ mobility in 5x STS<12, TUG Test<10, and Gait speed>1.47m/s.     PT LONG TERM GOAL #3   Title After 8 weeks pt will demonstrate improved balance with SLS >15s bilat.    PT LONG TERM GOAL #4   Title After 8 weeks pt will demonstrate 4/5 strength or greater in RLE.                Plan - 03/10/16 2031    Clinical Impression Statement Pt demonstrating mild to moderate strength impairment in the RUE and moderate to severe strength impairment in the RLE, most significant in the R ankle. Balance is impaired bilaterally, resultant in decreased ability to perform safe ambulation at distances and speeds previously toelrated prior to CVA. Pt will benefit from skilled PT intervention to address the above impaitrments and limitations to iimprove patient safety and to restore to PLOF in community distance ambulation.    Rehab Potential Good   Clinical Impairments Affecting Rehab Potential Chronicity of CVA    PT Frequency 2x / week   PT Duration 8 weeks   PT Treatment/Interventions Gait training;DME Instruction;Stair training;Functional mobility training;Therapeutic activities;Therapeutic  exercise;Balance training;Neuromuscular re-education;Patient/family education   PT Next Visit Plan Review HEP, Perform if tolerated or if more tolerated.    PT Home Exercise Plan Issued: R heel slides, R seated soleus strengthening, R seated dorsiflexion, R SLS balance, Digital opposition, 3 walks daily.    Consulted and Agree with Plan of Care Patient      Patient will benefit from skilled therapeutic intervention in order to improve the following deficits and impairments:  Abnormal gait, Decreased activity tolerance, Decreased balance, Decreased endurance, Decreased mobility, Decreased range of motion, Decreased strength, Difficulty walking, Increased muscle spasms  Visit Diagnosis: Hemiplegia, dominant side S/P CVA (cerebrovascular accident) (HCC)  Right leg weakness  Impaired functional mobility, balance, gait, and endurance  Unsteadiness on feet     Problem List Patient Active Problem List   Diagnosis Date Noted  . Lightheadedness 01/24/2014  . Nonischemic cardiomyopathy (HCC) 10/29/2013  . Chronic systolic CHF (congestive heart failure) (HCC) 04/26/2013  . Chest discomfort 04/26/2013  . Bradycardia 04/26/2013  . Automatic implantable cardioverter-defibrillator in situ 09/04/2012  . Hypokalemia   . Disability examination   . Drug therapy   . Ventricular tachyarrhythmia (HCC) 06/06/2012  . Headache(784.0)   . Sleepiness   . Mitral regurgitation   . Cardiomyopathy (HCC)   . Ejection fraction < 50%   . Renal insufficiency     4:21 PM, 03/11/2016 Rosamaria Lints, PT, DPT PRN Physical Therapist - [facility] License # [id number] [phone] (mobile)       [facility] [address] [place], [place], [address] Phone: [phone]   Fax:  [phone]  Name: Tomio Kirk Nancarrow MRN: [id number] Date of Birth: [birth year]

## 2016-03-10 NOTE — Patient Instructions (Signed)
Home Exercises Program °Theraputty Exercises °   °Do the following exercises 1-2 times a day using your affected hand.  °1. Roll putty into a ball. ° °2. Make into a pancake. ° °3. Roll putty into a roll. ° °4. Pinch along log with first finger and thumb.  ° °5. Make into a ball. ° °6. Roll it back into a log.  ° °7. Pinch using thumb and side of first finger. ° °8. Roll into a ball, then flatten into a pancake. ° °9. Using your fingers, make putty into a mountain. ° °

## 2016-03-10 NOTE — Therapy (Signed)
[facility] [address] [place], [place], [address] Phone: [phone]   Fax:  [phone]  Occupational Therapy Evaluation  Patient Details  Name: Adam [name] MRN: [id number] Date of Birth: Jun 25, 1966 Referring Provider: Dr. Kirstie Peri  Encounter Date: 03/10/2016      OT End of Session - 03/10/16 1603    Visit Number 1   Number of Visits 8   Date for OT Re-Evaluation 04/09/16   Authorization Type [employer]   Authorization Time Period Before 10th visit   Authorization - Visit Number 1   Authorization - Number of Visits 10   OT Start Time 1523   OT Stop Time 1550   OT Time Calculation (min) 27 min   Behavior During Therapy [facility] for tasks assessed/performed      Past Medical History  Diagnosis Date  . Mitral regurgitation   . NICM (nonischemic cardiomyopathy) (HCC)     Nonischemic / catheterization October, 2011, normal coronary arteries  . Chronic systolic heart failure (HCC)   . Ejection fraction < 50%     Echo 06/05/12: Mild LVH, EF 20%, posterior severe HK, anterolateral severe HK, anterior severe HK, mid to apical septal AK, AK of the true apex, apical inferior HK, restrictive physiology, trivial MR, moderate LAE, mildly reduced RV function, mild TR.  . ICD (implantable cardiac defibrillator) battery depletion 11/11/10    [employer]. Jude, Dr.Allred  . Renal insufficiency     Creatinine 1.5 (GFR greater than 50)  . Headache(784.0)     Patient seen to have headaches from spironolactone.  Drug was stopped, August, 2012  . Sleepiness     has some sleepiness during the day  . 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 VT (ventricular tachycardia) (HCC)     VT storm 7/13 - Sotalol started  . Drug therapy     Sotalol started for VT storm  July, 2013  . Hypokalemia     There was some decrease in potassium with his admission in July, 2013. We will watch this very carefully.  . Disability examination     Discussion of  disability September, 2013    Past Surgical History  Procedure Laterality Date  . Tee with cardioversion  11/17/08    No LV clot  . Cardiac defibrillator placement  11/11/10    SJM Fortify DR ICD implanted by Dr Johney Frame    There were no vitals filed for this visit.      Subjective Assessment - 03/10/16 1553    Subjective  S: This hand tires easily.    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 Pt did not receive any rehab services after discharge on 01/20/16. Pt was referred to occupational therapy for evaluation and treatment by Dr. Kirstie Peri.    Patient Stated Goals To use this right hand without tiring.    Currently in Pain? No/denies           [facility] OT Assessment - 03/10/16 1525    Assessment   Diagnosis s/p left MCA CVA with right hemiplegia   Referring Provider Dr. Kirstie Peri   Onset Date 01/09/16   Prior Therapy none   Precautions   Precautions None   Restrictions   Weight Bearing Restrictions No   Balance Screen   Has the patient fallen in the past 6 months No   Has the patient had a decrease in activity level because  of a fear of falling?  No   Is the patient reluctant to leave their home because of a fear of falling?  No   Home  Environment   Family/patient expects to be discharged to: Private residence   Living Arrangements Spouse/significant other   Prior Function   Level of Independence Independent with household mobility with device;Independent with basic ADLs   Leisure working out, time with family   ADL   ADL comments Pt requires increased time to perform fine motor tasks such as tying shoes, difficulty grasping and holding onto weighted objects with right hand, reports difficulty using/controlling utensils at times, handwriting is difficult    Written Expression   Dominant Hand Right   Vision - History   Baseline Vision No visual deficits   Patient Visual Report Other (comment)  difficulty  focusing on words, blurry vision at times   Additional Comments Pt able to read a sentence from book demonstrates good scanning.    Vision Assessment   Eye Alignment Within Functional Limits   Vision Assessment Vision tested   Ocular Range of Motion Within Functional Limits   Alignment/Gaze Preference Within Defined Limits   Tracking/Visual Pursuits Able to track stimulus in all quads without difficulty   Saccades Within functional limits   Convergence Within functional limits   Visual Fields No apparent deficits   Comment Pt displays potential right inattention during visual field testing. Pt able to name which fingers were moving (right versus left) however with both fingers moving on right and left, only attends to left side   Sensation   Light Touch Appears Intact   Stereognosis Appears Intact   Hot/Cold Appears Intact   Proprioception Appears Intact   Coordination   9 Hole Peg Test Right;Left   Right 9 Hole Peg Test 36.88"   Left 9 Hole Peg Test 26.93"   ROM / Strength   AROM / PROM / Strength Strength   Strength   Overall Strength Comments Assessed seated   Strength Assessment Site Shoulder;Elbow;Hand   Right/Left Shoulder Right   Right Shoulder Flexion 5/5   Right Shoulder ABduction 5/5   Right Shoulder Internal Rotation 5/5   Right Shoulder External Rotation 5/5   Right/Left Elbow Right   Right Elbow Flexion 5/5   Right Elbow Extension 5/5   Right/Left hand Right;Left   Right Hand Gross Grasp Functional   Right Hand Grip (lbs) 76   Right Hand Lateral Pinch 20 lbs   Right Hand 3 Point Pinch 14 lbs   Left Hand Gross Grasp Functional   Left Hand Grip (lbs) 125   Left Hand Lateral Pinch 28 lbs   Left Hand 3 Point Pinch 22 lbs                         OT Education - 03/10/16 1602    Education provided Yes   Education Details red theraputty for grip and pinch strengthening   Person(s) Educated Patient   Methods Explanation;Demonstration;Handout    Comprehension Verbalized understanding          OT Short Term Goals - 03/10/16 1608    OT SHORT TERM GOAL #1   Title Pt will be provided with and educated on HEP.    Time 4   Period Weeks   Status New   OT SHORT TERM GOAL #2   Title Pt will return to prior level of functioning and independence during daily and leisure tasks.  Time 4   Period Weeks   Status New   OT SHORT TERM GOAL #3   Title Pt will increase right grip strength by 15# to increase ability to carry weighted objects such as a gallon of milk.    Time 4   Period Weeks   Status New   OT SHORT TERM GOAL #4   Title Pt will increase right pinch strength by 6# to increase ability to hold onto and use utensils with right hand as dominant.    Time 4   Period Weeks   Status New   OT SHORT TERM GOAL #5   Title Pt will increase fine motor coordination to improve ability to complete daily tasks such as tying shoes and completing handwriting tasks.    Time 4   Period Weeks   Status New                  Plan - Mar 13, 2016 1604    Clinical Impression Statement A: Pt is a 50 y/o male s/p left MCA CVA on 01/09/16, presenting with right hemiplegia affecting primarily the hand. Pt presents with decreased grip strength, decreased pinch strength, and decreased fine motor coordination limiting ability to use RUE during daily tasks. Pt reports vision issues including difficulty focusing and blurred vision, to be further assessed during treatment sessions. Pt provided with red theraputty for HEP.    Rehab Potential Good   OT Frequency 2x / week   OT Duration 4 weeks   OT Treatment/Interventions Self-care/ADL training;DME and/or AE instruction;Patient/family education;Cryotherapy;Electrical Stimulation;Moist Heat;Therapeutic exercise;Manual Therapy;Therapeutic activities   Plan P: Pt will benefit from skilled occupational therapy services to increase right grip strength, increase right pinch strength, and increase fine motor  coordination required to use right hand as dominant during daily tasks. Treatment plan: grip and pinch strengthening, fine motor coordination activities, bilateral coordination tasks.    OT Home Exercise Plan red theraputty HEP   Consulted and Agree with Plan of Care Patient      Patient will benefit from skilled therapeutic intervention in order to improve the following deficits and impairments:  Decreased strength, Impaired UE functional use  Visit Diagnosis: Cerebrovascular accident (CVA), unspecified mechanism (HCC)  Muscle weakness (generalized)  Other symptoms and signs involving the musculoskeletal system  Other lack of coordination      G-Codes - 2016/03/13 1613    Functional Assessment Tool Used clinical judgement   Functional Limitation Carrying, moving and handling objects   Carrying, Moving and Handling Objects Current Status (Z6109) At least 20 percent but less than 40 percent impaired, limited or restricted   Carrying, Moving and Handling Objects Goal Status (U0454) At least 1 percent but less than 20 percent impaired, limited or restricted      Problem List Patient Active Problem List   Diagnosis Date Noted  . Lightheadedness 01/24/2014  . Nonischemic cardiomyopathy (HCC) 10/29/2013  . Chronic systolic CHF (congestive heart failure) (HCC) 04/26/2013  . Chest discomfort 04/26/2013  . Bradycardia 04/26/2013  . Automatic implantable cardioverter-defibrillator in situ 09/04/2012  . Hypokalemia   . Disability examination   . Drug therapy   . Ventricular tachyarrhythmia (HCC) 06/06/2012  . Headache(784.0)   . Sleepiness   . Mitral regurgitation   . Cardiomyopathy (HCC)   . Ejection fraction < 50%   . Renal insufficiency     Ezra Sites, OTR/L  [id number]  Mar 13, 2016, 4:14 PM  [facility] [address] [place], [place], [address] Phone:  [phone]   Fax:  [phone]  Name: Adam [name] MRN:  [id number] Date of Birth: Nov 19, 1965

## 2016-03-12 ENCOUNTER — Other Ambulatory Visit: Payer: Self-pay | Admitting: Cardiology

## 2016-03-14 ENCOUNTER — Ambulatory Visit (HOSPITAL_COMMUNITY): Payer: Medicare HMO | Admitting: Speech Pathology

## 2016-03-14 DIAGNOSIS — R4701 Aphasia: Secondary | ICD-10-CM

## 2016-03-14 DIAGNOSIS — R480 Dyslexia and alexia: Secondary | ICD-10-CM

## 2016-03-14 DIAGNOSIS — R488 Other symbolic dysfunctions: Secondary | ICD-10-CM

## 2016-03-14 NOTE — Therapy (Signed)
[facility] [address] [place], [place], [address] Phone: [phone]   Fax:  [phone]  Speech Language Pathology Treatment  Patient Details  Name: Adam [name] MRN: [id number] Date of Birth: 05-31-1966 Referring Provider: Alison Murray  Encounter Date: 03/14/2016      End of Session - 03/14/16 1527    Visit Number 10   Number of Visits 24   Date for SLP Re-Evaluation 04/04/16   Authorization Type [employer]   Authorization Time Period 02/04/2016-04/04/2016   SLP Start Time 1345   SLP Stop Time  1430   SLP Time Calculation (min) 45 min   Activity Tolerance Patient tolerated treatment well      Past Medical History  Diagnosis Date  . Mitral regurgitation   . NICM (nonischemic cardiomyopathy) (HCC)     Nonischemic / catheterization October, 2011, normal coronary arteries  . Chronic systolic heart failure (HCC)   . Ejection fraction < 50%     Echo 06/05/12: Mild LVH, EF 20%, posterior severe HK, anterolateral severe HK, anterior severe HK, mid to apical septal AK, AK of the true apex, apical inferior HK, restrictive physiology, trivial MR, moderate LAE, mildly reduced RV function, mild TR.  . ICD (implantable cardiac defibrillator) battery depletion 11/11/10    [employer] Jude, Dr.Allred  . Renal insufficiency     Creatinine 1.5 (GFR greater than 50)  . Headache(784.0)     Patient seen to have headaches from spironolactone.  Drug was stopped, August, 2012  . Sleepiness     has some sleepiness during the day  . 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 VT (ventricular tachycardia) (HCC)     VT storm 7/13 - Sotalol started  . Drug therapy     Sotalol started for VT storm  July, 2013  . Hypokalemia     There was some decrease in potassium with his admission in July, 2013. We will watch this very carefully.  . Disability examination     Discussion of disability September, 2013    Past Surgical History  Procedure  Laterality Date  . Tee with cardioversion  11/17/08    No LV clot  . Cardiac defibrillator placement  11/11/10    SJM Fortify DR ICD implanted by Dr Johney Frame    There were no vitals filed for this visit.      Subjective Assessment - 03/14/16 1527    Subjective "I am doing alright."   Patient is accompained by: Family member   Currently in Pain? No/denies               ADULT SLP TREATMENT - 03/14/16 1527    General Information   Behavior/Cognition Alert;Cooperative;Pleasant mood   Patient Positioning Upright in chair   Oral care provided N/A   HPI [name] is a 50 yo male from [place], [place] with a past medical history of atrial fibrillation, CHF, s/p cardiac pacemaker implantation, and gout who developed right hemiplegia and global aphasia on 01/09/2016. He was taken to [facility] where his studies showed left MCA stroke. He received IV TPA at [facility] and was airlifted to [facility] as stroke code. 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 He developed SOB and was started on BIPAP during that hospitalization. He was transferred to an inpatient rehabilitation facility on 01/15/16, but showed seizure activity on 01/19/16 and was re-hospitalized. He was discharged home  from that hospitalization on 01/20/2016. He has not received speech therapy since 01/19/16 unfortunately. He was referred by Dr. Alison Murray for outpatient SLP services due to aphasia from recent stroke. He was accompanied to the appointment by Adam [name], his wife   Treatment Provided   Treatment provided Cognitive-Linquistic   Pain Assessment   Pain Assessment No/denies pain   Cognitive-Linquistic Treatment   Treatment focused on Aphasia;Patient/family/caregiver education   Skilled Treatment pt education, compensatory strategies, word finding activities, automatic speech drills   Assessment / Recommendations / Plan   Plan Continue with  current plan of care   Progression Toward Goals   Progression toward goals Progressing toward goals            SLP Short Term Goals - 03/14/16 1528    SLP SHORT TERM GOAL #1   Title Pt will increase naming of common objects/pictures to 90% acc when provided with mod/max multimodality cues    Baseline 72%   Time 8   Period Weeks   Status On-going   SLP SHORT TERM GOAL #2   Title Pt will complete single word sentence completion tasks (SWSC) with 90% acc when provided with mod multimodality cues.   Baseline 70%   Time 8   Period Weeks   Status On-going   SLP SHORT TERM GOAL #3   Title Pt will increase divergent naming to 5 items per concrete category with mod cues.   Baseline 1-2   Time 8   Period Weeks   Status On-going   SLP SHORT TERM GOAL #4   Title Pt will complete basic level reading comprehension tasks (picture cues for sentence level) with 70% acc with provided mod cues.   Baseline 25%   Time 8   Period Weeks   Status On-going   SLP SHORT TERM GOAL #5   Title Pt will answer moderate level auditory comprehension yes/no questions with 80% acc when provided mod cues.   Baseline 65%   Time 8   Period Weeks   Status On-going   SLP SHORT TERM GOAL #6   Title Pt will be able to write personal/bio information with 90% acc when provided mod cues   Baseline name only   Time 8   Period Weeks   Status On-going          SLP Long Term Goals - 03/14/16 1528    SLP LONG TERM GOAL #1   Title Pt will communicate moderate level wants/needs/thoughts to family and friends with use of multimodality communication strategies as needed.    Baseline mod/severe impairment   Time 3   Period Months   Status On-going          Plan - 03/14/16 1528    Clinical Impression Statement Pt discussed his weekend with SLP when provided structure and probe cues for details. He attempts to use word finding strategies in conversation, but still requires assist to "describe" the  object/thought in his mind. Today's session focused on phonemic awareness of consonant sounds. He was able to write individual letters presented by SLP to dictation with 100% acc when allowed occasional repetitions and extra time. He acknowledged that it was not an easy task and required thought. SLP then presented phonemes and asked pt to write the corresponding letter, which he completed with 88% acc; finally he was requested to produce the sound/phoneme of individual letters when SLP pointed to each. This was more difficult for him and he completed with 79% acc when provided  with mi/mod cues. Expanding utterances were targeted by showing Mr. Cowdery a picture of an action and having him tell SLP a complete sentence. He benefited from occasional initiation cues to answer "who" and "what" to create a sentence. Continue with this exercise next session. He continues to make excellent progress.   Speech Therapy Frequency 3x / week   Duration --  8 weeks   Treatment/Interventions Language facilitation;Compensatory techniques;SLP instruction and feedback;Cueing hierarchy;Patient/family education;Compensatory strategies;Multimodal communcation approach;Functional tasks   Potential to Achieve Goals Good   Potential Considerations Severity of impairments   SLP Home Exercise Plan Pt will be independent with HEP to facilitate carryover of treatment strategies and techniques in home/community environment with assist from wife as needed.    Consulted and Agree with Plan of Care Patient;Family member/caregiver   Family Member Consulted Wife, Adam [name]      Patient will benefit from skilled therapeutic intervention in order to improve the following deficits and impairments:   Aphasia  Dyslexia and alexia  Agraphia      G-Codes - Mar 25, 2016 1529    Functional Assessment Tool Used clinical judgment   Functional Limitations Spoken language expressive   Spoken Language Expression Current Status [phone]) At  least 40 percent but less than 60 percent impaired, limited or restricted   Spoken Language Expression Goal Status (U0454) At least 1 percent but less than 20 percent impaired, limited or restricted      Problem List Patient Active Problem List   Diagnosis Date Noted  . Lightheadedness 01/24/2014  . Nonischemic cardiomyopathy (HCC) 10/29/2013  . Chronic systolic CHF (congestive heart failure) (HCC) 04/26/2013  . Chest discomfort 04/26/2013  . Bradycardia 04/26/2013  . Automatic implantable cardioverter-defibrillator in situ 09/04/2012  . Hypokalemia   . Disability examination   . Drug therapy   . Ventricular tachyarrhythmia (HCC) 06/06/2012  . Headache(784.0)   . Sleepiness   . Mitral regurgitation   . Cardiomyopathy (HCC)   . Ejection fraction < 50%   . Renal insufficiency    Thank you,  Havery Moros, CCC-SLP [phone]  [facility] Mar 25, 2016, 3:29 PM  [facility] [address] [place], [place], [address] Phone: [phone]   Fax:  [phone]   Name: Nealy Karapetian Stetler MRN: [id number] Date of Birth: 02/19/1966

## 2016-03-16 ENCOUNTER — Encounter (HOSPITAL_COMMUNITY): Payer: Self-pay | Admitting: Speech Pathology

## 2016-03-16 ENCOUNTER — Ambulatory Visit (HOSPITAL_COMMUNITY): Payer: Medicare HMO | Admitting: Speech Pathology

## 2016-03-16 DIAGNOSIS — R480 Dyslexia and alexia: Secondary | ICD-10-CM

## 2016-03-16 DIAGNOSIS — R488 Other symbolic dysfunctions: Secondary | ICD-10-CM

## 2016-03-16 DIAGNOSIS — R4701 Aphasia: Secondary | ICD-10-CM

## 2016-03-16 NOTE — Therapy (Signed)
[facility] [address] [place], [place], [address] Phone: [phone]   Fax:  [phone]  Speech Language Pathology Treatment  Patient Details  Name: Adam [name] MRN: [id number] Date of Birth: 03-May-1966 Referring Provider: Alison Murray  Encounter Date: 03/16/2016      End of Session - 03/16/16 2214    Visit Number 11   Number of Visits 24   Date for SLP Re-Evaluation 04/04/16   Authorization Type [employer]   Authorization Time Period 02/04/2016-04/04/2016   SLP Start Time 0907   SLP Stop Time  1002   SLP Time Calculation (min) 55 min   Activity Tolerance Patient tolerated treatment well      Past Medical History  Diagnosis Date  . Mitral regurgitation   . NICM (nonischemic cardiomyopathy) (HCC)     Nonischemic / catheterization October, 2011, normal coronary arteries  . Chronic systolic heart failure (HCC)   . Ejection fraction < 50%     Echo 06/05/12: Mild LVH, EF 20%, posterior severe HK, anterolateral severe HK, anterior severe HK, mid to apical septal AK, AK of the true apex, apical inferior HK, restrictive physiology, trivial MR, moderate LAE, mildly reduced RV function, mild TR.  . ICD (implantable cardiac defibrillator) battery depletion 11/11/10    [employer] Jude, Dr.Allred  . Renal insufficiency     Creatinine 1.5 (GFR greater than 50)  . Headache(784.0)     Patient seen to have headaches from spironolactone.  Drug was stopped, August, 2012  . Sleepiness     has some sleepiness during the day  . 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 VT (ventricular tachycardia) (HCC)     VT storm 7/13 - Sotalol started  . Drug therapy     Sotalol started for VT storm  July, 2013  . Hypokalemia     There was some decrease in potassium with his admission in July, 2013. We will watch this very carefully.  . Disability examination     Discussion of disability September, 2013    Past Surgical History  Procedure  Laterality Date  . Tee with cardioversion  11/17/08    No LV clot  . Cardiac defibrillator placement  11/11/10    SJM Fortify DR ICD implanted by Dr Johney Frame    There were no vitals filed for this visit.      Subjective Assessment - 03/16/16 2212    Subjective "Good!"   Patient is accompained by: Family member   Currently in Pain? No/denies               ADULT SLP TREATMENT - 03/16/16 1713    General Information   Behavior/Cognition Alert;Cooperative;Pleasant mood   Patient Positioning Upright in chair   Oral care provided N/A   HPI Adam [name] is a 50 yo male from [place], [place] with a past medical history of atrial fibrillation, CHF, s/p cardiac pacemaker implantation, and gout who developed right hemiplegia and global aphasia on 01/09/2016. He was taken to [facility] where his studies showed left MCA stroke. He received IV TPA at [facility] and was airlifted to [facility] as stroke code. 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 He developed SOB and was started on BIPAP during that hospitalization. He was transferred to an inpatient rehabilitation facility on 01/15/16, but showed seizure activity on 01/19/16 and was re-hospitalized. He was discharged home from that hospitalization  on 01/20/2016. He has not received speech therapy since 01/19/16 unfortunately. He was referred by Dr. Alison Murray for outpatient SLP services due to aphasia from recent stroke. He was accompanied to the appointment by [name], his wife   Treatment Provided   Treatment provided Cognitive-Linquistic   Pain Assessment   Pain Assessment No/denies pain   Cognitive-Linquistic Treatment   Treatment focused on Aphasia;Patient/family/caregiver education   Skilled Treatment pt education, compensatory strategies, word finding activities, automatic speech drills   Assessment / Recommendations / Plan   Plan Continue with current plan of  care   Progression Toward Goals   Progression toward goals Progressing toward goals            SLP Short Term Goals - 03/16/16 2218    SLP SHORT TERM GOAL #1   Title Pt will increase naming of common objects/pictures to 90% acc when provided with mod/max multimodality cues    Baseline 72%   Time 8   Period Weeks   Status On-going   SLP SHORT TERM GOAL #2   Title Pt will complete single word sentence completion tasks [facility]) with 90% acc when provided with mod multimodality cues.   Baseline 70%   Time 8   Period Weeks   Status On-going   SLP SHORT TERM GOAL #3   Title Pt will increase divergent naming to 5 items per concrete category with mod cues.   Baseline 1-2   Time 8   Period Weeks   Status On-going   SLP SHORT TERM GOAL #4   Title Pt will complete basic level reading comprehension tasks (picture cues for sentence level) with 70% acc with provided mod cues.   Baseline 25%   Time 8   Period Weeks   Status On-going   SLP SHORT TERM GOAL #5   Title Pt will answer moderate level auditory comprehension yes/no questions with 80% acc when provided mod cues.   Baseline 65%   Time 8   Period Weeks   Status On-going   SLP SHORT TERM GOAL #6   Title Pt will be able to write personal/bio information with 90% acc when provided mod cues   Baseline name only   Time 8   Period Weeks   Status On-going          SLP Long Term Goals - 03/16/16 2219    SLP LONG TERM GOAL #1   Title Pt will communicate moderate level wants/needs/thoughts to family and friends with use of multimodality communication strategies as needed.    Baseline mod/severe impairment   Time 3   Period Months   Status On-going          Plan - 03/16/16 2217    Clinical Impression Statement [facility] Naming Test) was administered to pt this date. Pt achieved 29/60 and had more difficulty with less frequently used words (he named the first 19 correctly). Errors were characterized by perseverative  responses and phonemic paraphasias (real words and nonwords). Pt also produced semantically related paraphasias (moose/rhino). Pt was generally aware of errors when they occurred, but had difficulty self cuing without use of written choice responses. Continue plan of care.   Speech Therapy Frequency 2x / week   Duration --  8 weeks   Treatment/Interventions Language facilitation;Compensatory techniques;SLP instruction and feedback;Cueing hierarchy;Patient/family education;Compensatory strategies;Multimodal communcation approach;Functional tasks   Potential to Achieve Goals Good   Potential Considerations Severity of impairments   SLP Home Exercise Plan Pt will be independent with HEP  to facilitate carryover of treatment strategies and techniques in home/community environment with assist from wife as needed.    Consulted and Agree with Plan of Care Patient;Family member/caregiver   Family Member Consulted Wife, [name]      Patient will benefit from skilled therapeutic intervention in order to improve the following deficits and impairments:   Aphasia  Dyslexia and alexia  Agraphia    Problem List Patient Active Problem List   Diagnosis Date Noted  . Lightheadedness 01/24/2014  . Nonischemic cardiomyopathy (HCC) 10/29/2013  . Chronic systolic CHF (congestive heart failure) (HCC) 04/26/2013  . Chest discomfort 04/26/2013  . Bradycardia 04/26/2013  . Automatic implantable cardioverter-defibrillator in situ 09/04/2012  . Hypokalemia   . Disability examination   . Drug therapy   . Ventricular tachyarrhythmia (HCC) 06/06/2012  . Headache(784.0)   . Sleepiness   . Mitral regurgitation   . Cardiomyopathy (HCC)   . Ejection fraction < 50%   . Renal insufficiency    Thank you,  Havery Moros, CCC-SLP [phone]   [facility] 03/16/2016, 10:22 PM  [facility] [address] [place], [place], [address] Phone: [phone]    Fax:  [phone]   Name: Trequon Paone Tropea MRN: [id number] Date of Birth: November 18, 1965

## 2016-03-21 ENCOUNTER — Ambulatory Visit (HOSPITAL_COMMUNITY): Payer: Medicare HMO | Admitting: Speech Pathology

## 2016-03-21 ENCOUNTER — Ambulatory Visit (HOSPITAL_COMMUNITY): Payer: Medicare HMO | Admitting: Physical Therapy

## 2016-03-21 DIAGNOSIS — I639 Cerebral infarction, unspecified: Secondary | ICD-10-CM

## 2016-03-21 DIAGNOSIS — R29898 Other symptoms and signs involving the musculoskeletal system: Secondary | ICD-10-CM

## 2016-03-21 DIAGNOSIS — R4701 Aphasia: Secondary | ICD-10-CM | POA: Diagnosis not present

## 2016-03-21 DIAGNOSIS — R480 Dyslexia and alexia: Secondary | ICD-10-CM

## 2016-03-21 DIAGNOSIS — M6281 Muscle weakness (generalized): Secondary | ICD-10-CM

## 2016-03-21 DIAGNOSIS — R488 Other symbolic dysfunctions: Secondary | ICD-10-CM

## 2016-03-21 NOTE — Therapy (Signed)
[facility] [address] [place], [place], [address] Phone: [phone]   Fax:  [phone]  Speech Language Pathology Treatment  Patient Details  Name: Adam [name] MRN: [id number] Date of Birth: 01-17-1966 Referring Provider: Alison Murray  Encounter Date: 03/21/2016      End of Session - 03/21/16 1511    Visit Number 12   Number of Visits 24   Date for SLP Re-Evaluation 04/04/16   Authorization Type [employer]   Authorization Time Period 02/04/2016-04/04/2016   SLP Start Time 1345   SLP Stop Time  1437   SLP Time Calculation (min) 52 min   Activity Tolerance Patient tolerated treatment well      Past Medical History  Diagnosis Date  . Mitral regurgitation   . NICM (nonischemic cardiomyopathy) (HCC)     Nonischemic / catheterization October, 2011, normal coronary arteries  . Chronic systolic heart failure (HCC)   . Ejection fraction < 50%     Echo 06/05/12: Mild LVH, EF 20%, posterior severe HK, anterolateral severe HK, anterior severe HK, mid to apical septal AK, AK of the true apex, apical inferior HK, restrictive physiology, trivial MR, moderate LAE, mildly reduced RV function, mild TR.  . ICD (implantable cardiac defibrillator) battery depletion 11/11/10    [employer]. Jude, Dr.Allred  . Renal insufficiency     Creatinine 1.5 (GFR greater than 50)  . Headache(784.0)     Patient seen to have headaches from spironolactone.  Drug was stopped, August, 2012  . Sleepiness     has some sleepiness during the day  . 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 VT (ventricular tachycardia) (HCC)     VT storm 7/13 - Sotalol started  . Drug therapy     Sotalol started for VT storm  July, 2013  . Hypokalemia     There was some decrease in potassium with his admission in July, 2013. We will watch this very carefully.  . Disability examination     Discussion of disability September, 2013    Past Surgical History  Procedure  Laterality Date  . Tee with cardioversion  11/17/08    No LV clot  . Cardiac defibrillator placement  11/11/10    SJM Fortify DR ICD implanted by Dr Johney Frame    There were no vitals filed for this visit.      Subjective Assessment - 03/21/16 1510    Subjective "We had another friend pass away."   Currently in Pain? No/denies               ADULT SLP TREATMENT - 03/21/16 1510    General Information   Behavior/Cognition Alert;Cooperative;Pleasant mood   Patient Positioning Upright in chair   Oral care provided N/A   HPI [name] is a 50 yo male from [place], [place] with a past medical history of atrial fibrillation, CHF, s/p cardiac pacemaker implantation, and gout who developed right hemiplegia and global aphasia on 01/09/2016. He was taken to [facility] where his studies showed left MCA stroke. He received IV TPA at [facility] and was airlifted to [facility] as stroke code. 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 He developed SOB and was started on BIPAP during that hospitalization. He was transferred to an inpatient rehabilitation facility on 01/15/16, but showed seizure activity on 01/19/16 and was re-hospitalized. He was discharged home from that hospitalization on 01/20/2016. He  has not received speech therapy since 01/19/16 unfortunately. He was referred by Dr. Alison Murray for outpatient SLP services due to aphasia from recent stroke. He was accompanied to the appointment by Adam [name], his wife   Treatment Provided   Treatment provided Cognitive-Linquistic   Pain Assessment   Pain Assessment No/denies pain   Cognitive-Linquistic Treatment   Treatment focused on Aphasia;Patient/family/caregiver education   Skilled Treatment pt education, compensatory strategies, word finding activities, automatic speech drills   Assessment / Recommendations / Plan   Plan Continue with current plan of care    Progression Toward Goals   Progression toward goals Progressing toward goals          SLP Education - 03/21/16 1511    Education provided Yes   Education Details Discussed home program and talking with his PCP about considering an antidepressant   Person(s) Educated Patient;Spouse   Methods Explanation;Verbal cues   Comprehension Verbalized understanding          SLP Short Term Goals - 03/21/16 1519    SLP SHORT TERM GOAL #1   Title Pt will increase naming of common objects/pictures to 90% acc when provided with mod/max multimodality cues    Baseline 72%   Time 8   Period Weeks   Status On-going   SLP SHORT TERM GOAL #2   Title Pt will complete single word sentence completion tasks (SWSC) with 90% acc when provided with mod multimodality cues.   Baseline 70%   Time 8   Period Weeks   Status On-going   SLP SHORT TERM GOAL #3   Title Pt will increase divergent naming to 5 items per concrete category with mod cues.   Baseline 1-2   Time 8   Period Weeks   Status On-going   SLP SHORT TERM GOAL #4   Title Pt will complete basic level reading comprehension tasks (picture cues for sentence level) with 70% acc with provided mod cues.   Baseline 25%   Time 8   Period Weeks   Status On-going   SLP SHORT TERM GOAL #5   Title Pt will answer moderate level auditory comprehension yes/no questions with 80% acc when provided mod cues.   Baseline 65%   Time 8   Period Weeks   Status On-going   SLP SHORT TERM GOAL #6   Title Pt will be able to write personal/bio information with 90% acc when provided mod cues   Baseline name only   Time 8   Period Weeks   Status On-going          SLP Long Term Goals - 03/21/16 1520    SLP LONG TERM GOAL #1   Title Pt will communicate moderate level wants/needs/thoughts to family and friends with use of multimodality communication strategies as needed.    Baseline mod/severe impairment   Time 3   Period Months   Status On-going           Plan - 03/21/16 1512    Clinical Impression Statement [name] was tearful at times during therapy today. He initiated a conversation regarding his progress and frustration that he seems to be talking well in therapy, but is not able to talk as well in home and community. His fiance shared previously that Adam [name] was gregarious and talkative prior to his stroke, so his aphasia has been a tremendous adjustment for them. SLP inquired about completing home exercise program and his fiance stated that he will read through the material at  home, but frequently preferred to complete the work in session with SLP. In session, [name] was encouraged to use his notebook with written cues to aid in communication regarding personal interests (football, basketball, church, and family). He continues to have difficulty retrieving names of objects, people, and places during conversation and he needs reminders to use his book to look for the information. He seemed to enjoy talking about the [place] and was able to name seven offensive positions with min cues from SLP. He had more difficulty naming particular players, which he typically would not have difficulty doing. Session reviewed with wife and discussed the possibility of asking his doctor about trying an antidepressant which may help with mood, outlook, and initiation.   Speech Therapy Frequency 2x / week   Duration --  8 weeks   Treatment/Interventions Language facilitation;Compensatory techniques;SLP instruction and feedback;Cueing hierarchy;Patient/family education;Compensatory strategies;Multimodal communcation approach;Functional tasks   Potential to Achieve Goals Good   Potential Considerations Severity of impairments   SLP Home Exercise Plan Pt will be independent with HEP to facilitate carryover of treatment strategies and techniques in home/community environment with assist from wife as needed.    Consulted and Agree with Plan of  Care Patient;Family member/caregiver   Family Member Consulted Wife, Adam [name]      Patient will benefit from skilled therapeutic intervention in order to improve the following deficits and impairments:   Aphasia  Dyslexia and alexia  Agraphia    Problem List Patient Active Problem List   Diagnosis Date Noted  . Lightheadedness 01/24/2014  . Nonischemic cardiomyopathy (HCC) 10/29/2013  . Chronic systolic CHF (congestive heart failure) (HCC) 04/26/2013  . Chest discomfort 04/26/2013  . Bradycardia 04/26/2013  . Automatic implantable cardioverter-defibrillator in situ 09/04/2012  . Hypokalemia   . Disability examination   . Drug therapy   . Ventricular tachyarrhythmia (HCC) 06/06/2012  . Headache(784.0)   . Sleepiness   . Mitral regurgitation   . Cardiomyopathy (HCC)   . Ejection fraction < 50%   . Renal insufficiency    Thank you,  Havery Moros, CCC-SLP [phone]  [facility] 03/21/2016, 3:21 PM  [facility] [address] [place], [place], [address] Phone: [phone]   Fax:  [phone]   Name: Adam [name] MRN: [id number] Date of Birth: 11/06/1966

## 2016-03-21 NOTE — Patient Instructions (Signed)
Toe / Heel Raise    Gently rock back on heels and raise toes. Then rock forward on toes and raise heels. Repeat sequence __10__ times per session. Do __2__ sets a day.  Extension    Lift leg up in the air and bring it back down.  Repeat with other leg.  Repeat __10__ times. Do __2__ sessions per day.      Abduction: Side Leg Lift (Eccentric) - Side-Lying    Lie on side. Lift top leg slightly higher than shoulder level. Keep top leg straight with body, toes pointing forward. Slowly lower for 3-5 seconds. _10__ reps per set, __2_ sets per day     Straight Leg Raise    Tighten stomach and slowly raise locked right leg _15___ inches from floor. Repeat __10__ times per set. Do _2___ sets per session. Do __2__ sessions per day.     Bridge [employer] small of back into mat, maintain pelvic tilt, roll up one vertebrae at a time. Focus on engaging posterior hip muscles.  Hold for  _3_ seconds. Repeat __10__ times.

## 2016-03-21 NOTE — Therapy (Signed)
[facility] [address] [place], [place], [address] Phone: [phone]   Fax:  [phone]  Physical Therapy Treatment  Patient Details  Name: Adam [name] MRN: [id number] Date of Birth: 1966/10/22 Referring Provider: Kirstie Peri  Encounter Date: 03/21/2016      PT End of Session - 03/21/16 1611    Visit Number 2   Number of Visits 16   Date for PT Re-Evaluation 04/10/16   Authorization Type Medicaid   Authorization Time Period 03/10/16-05/10/16   Authorization - Visit Number 2   PT Start Time 1515   PT Stop Time 1600   PT Time Calculation (min) 45 min   Equipment Utilized During Treatment Gait belt   Activity Tolerance Patient tolerated treatment well   Behavior During Therapy [facility] for tasks assessed/performed      Past Medical History  Diagnosis Date  . Mitral regurgitation   . NICM (nonischemic cardiomyopathy) (HCC)     Nonischemic / catheterization October, 2011, normal coronary arteries  . Chronic systolic heart failure (HCC)   . Ejection fraction < 50%     Echo 06/05/12: Mild LVH, EF 20%, posterior severe HK, anterolateral severe HK, anterior severe HK, mid to apical septal AK, AK of the true apex, apical inferior HK, restrictive physiology, trivial MR, moderate LAE, mildly reduced RV function, mild TR.  . ICD (implantable cardiac defibrillator) battery depletion 11/11/10    St. Jude, Dr.Allred  . Renal insufficiency     Creatinine 1.5 (GFR greater than 50)  . Headache(784.0)     Patient seen to have headaches from spironolactone.  Drug was stopped, August, 2012  . Sleepiness     has some sleepiness during the day  . 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 VT (ventricular tachycardia) (HCC)     VT storm 7/13 - Sotalol started  . Drug therapy     Sotalol started for VT storm  July, 2013  . Hypokalemia     There was some decrease in potassium with his admission in July, 2013. We will watch this very  carefully.  . Disability examination     Discussion of disability September, 2013    Past Surgical History  Procedure Laterality Date  . Tee with cardioversion  11/17/08    No LV clot  . Cardiac defibrillator placement  11/11/10    SJM Fortify DR ICD implanted by Dr Johney Frame    There were no vitals filed for this visit.      Subjective Assessment - 03/21/16 1601    Subjective Pt states he is doing well and doesn't have any gout currently.  Reports complaince with HEP an walking daily.   Currently in Pain? No/denies            [facility] PT Assessment - 03/21/16 0001    Assessment   Medical Diagnosis s/p CVA, R hemiplegia    6 Minute Walk- Baseline   6 Minute Walk- Baseline --   6 minute walk test results    Aerobic Endurance Distance Walked 1356   Endurance additional comments 3.7 feet/sec                     [facility] Adult PT Treatment/Exercise - 03/21/16 0001    Ambulation/Gait   Ambulation/Gait Yes   Ambulation/Gait Assistance 7: Independent   Ambulation Distance (Feet) 1356 Feet   Assistive device None   Gait Pattern Within Functional Limits   Ambulation Surface Level;Indoor   Gait  velocity 3.7 feet/sec   Knee/Hip Exercises: Standing   Heel Raises Both;10 reps   Heel Raises Limitations toeraises 10reps bilaterally fingertips only   Knee/Hip Exercises: Supine   Bridges 10 reps   Bridges Limitations with leg extension/flexion each rep   Straight Leg Raises Both;10 reps;2 sets   Knee/Hip Exercises: Sidelying   Hip ABduction Right;2 sets;10 reps   Knee/Hip Exercises: Prone   Hamstring Curl 2 sets;10 reps   Hip Extension Both;2 sets;10 reps                PT Education - 03/21/16 1615    Education provided Yes   Education Details reviewed initial evaluation and HEP.  updated HEP   Person(s) Educated Patient   Methods Explanation;Demonstration;Tactile cues;Verbal cues;Handout   Comprehension Verbalized understanding;Returned demonstration;Verbal  cues required;Tactile cues required          PT Short Term Goals - 03/21/16 1620    PT SHORT TERM GOAL #1   Title After 2 weeks pt will be indep in HEP.    Time 2   Period Weeks   Status Achieved   PT SHORT TERM GOAL #2   Title After 4 weeks pt will demonstrate improved functional strength mobility in 5x STS, TUG Test, and Gait speed.    Time 4   Period Weeks   Status On-going   PT SHORT TERM GOAL #3   Title After 4 weeks pt will demonstrate improved balance with BBT>53.    Time 4   Period Weeks   Status On-going   PT SHORT TERM GOAL #4   Title After 4 weeks pt will demonstrate >3+/5 strength in RLE.    Time 4   Period Weeks   Status On-going           PT Long Term Goals - 03/21/16 1621    PT LONG TERM GOAL #1   Title After 7 weeks pt will be indep in advanced HEP for DC.    Time 7   Period Weeks   Status On-going   PT LONG TERM GOAL #2   Title After 8 weeks pt will demonstrate improved functional strength mobility in 5x STS, TUG Test, and Gait speed.    Time 8   Period Weeks   Status On-going   PT LONG TERM GOAL #3   Title After 8 weeks pt will demonstrate improved balance with SLS >15s bilat.    Time 8   Period Weeks   Status On-going   PT LONG TERM GOAL #4   Title After 8 weeks pt will demonstrate 4/5 strength or greater in RLE.    Time 8   Period Weeks   Status On-going               Plan - 03/21/16 1615    Clinical Impression Statement Pt able to complete HEP with multimodal cues, however no noted difficulty or challenge with previous HEP.  Pt also with difficulty keeping count of exercises, often completing in excess.  Progressed difficulty of therex this session with standing heel/toe raises and increased challenge of LE therex.  Pt able to compelte without c/o but with noted fatigue.  6 minute walk test performed with 1356 feet completed.  PT is progressing well.   Rehab Potential Good   Clinical Impairments Affecting Rehab Potential  Chronicity of CVA    PT Frequency 2x / week   PT Duration 8 weeks   PT Treatment/Interventions Gait training;DME Instruction;Stair training;Functional mobility training;Therapeutic activities;Therapeutic  exercise;Balance training;Neuromuscular re-education;Patient/family education   PT Next Visit Plan Progress therex to include standing lunges, squats and stairs.  Progress balance adding dynamic balance actvitieis.     PT Home Exercise Plan updated to include standing heel and toe raises, supine SLR and bride, sidelying hip abduction and prone hip extension.   Consulted and Agree with Plan of Care Patient      Patient will benefit from skilled therapeutic intervention in order to improve the following deficits and impairments:  Abnormal gait, Decreased activity tolerance, Decreased balance, Decreased endurance, Decreased mobility, Decreased range of motion, Decreased strength, Difficulty walking, Increased muscle spasms  Visit Diagnosis: Cerebrovascular accident (CVA), unspecified mechanism (HCC)  Muscle weakness (generalized)  Other symptoms and signs involving the musculoskeletal system     Problem List Patient Active Problem List   Diagnosis Date Noted  . Lightheadedness 01/24/2014  . Nonischemic cardiomyopathy (HCC) 10/29/2013  . Chronic systolic CHF (congestive heart failure) (HCC) 04/26/2013  . Chest discomfort 04/26/2013  . Bradycardia 04/26/2013  . Automatic implantable cardioverter-defibrillator in situ 09/04/2012  . Hypokalemia   . Disability examination   . Drug therapy   . Ventricular tachyarrhythmia (HCC) 06/06/2012  . Headache(784.0)   . Sleepiness   . Mitral regurgitation   . Cardiomyopathy (HCC)   . Ejection fraction < 50%   . Renal insufficiency     Lurena Nida, PTA/CLT [phone]  03/21/2016, 4:21 PM  [facility] [address] [place], [place], [address] Phone: [phone]   Fax:  [phone]  Name:  Pope Brunty Can MRN: [id number] Date of Birth: [birth year]

## 2016-03-23 ENCOUNTER — Ambulatory Visit (HOSPITAL_COMMUNITY): Payer: Medicare HMO | Admitting: Speech Pathology

## 2016-03-23 ENCOUNTER — Ambulatory Visit (HOSPITAL_COMMUNITY): Payer: Medicare HMO | Admitting: Physical Therapy

## 2016-03-23 ENCOUNTER — Encounter (HOSPITAL_COMMUNITY): Payer: Self-pay | Admitting: Speech Pathology

## 2016-03-23 DIAGNOSIS — R4701 Aphasia: Secondary | ICD-10-CM

## 2016-03-23 DIAGNOSIS — R488 Other symbolic dysfunctions: Secondary | ICD-10-CM

## 2016-03-23 DIAGNOSIS — R29898 Other symptoms and signs involving the musculoskeletal system: Secondary | ICD-10-CM

## 2016-03-23 DIAGNOSIS — M6281 Muscle weakness (generalized): Secondary | ICD-10-CM

## 2016-03-23 DIAGNOSIS — R480 Dyslexia and alexia: Secondary | ICD-10-CM

## 2016-03-23 NOTE — Therapy (Signed)
[facility] [address] [place], [place], [address] Phone: [phone]   Fax:  [phone]  Speech Language Pathology Treatment  Patient Details  Name: Adam [name] MRN: [id number] Date of Birth: May 23, 1966 Referring Provider: Alison Murray  Encounter Date: 03/23/2016      End of Session - 03/23/16 1336    Visit Number 13   Number of Visits 24   Date for SLP Re-Evaluation 04/04/16   Authorization Type [employer]   Authorization Time Period 02/04/2016-04/04/2016   SLP Start Time 0950   SLP Stop Time  1035   SLP Time Calculation (min) 45 min   Activity Tolerance Patient tolerated treatment well      Past Medical History  Diagnosis Date  . Mitral regurgitation   . NICM (nonischemic cardiomyopathy) (HCC)     Nonischemic / catheterization October, 2011, normal coronary arteries  . Chronic systolic heart failure (HCC)   . Ejection fraction < 50%     Echo 06/05/12: Mild LVH, EF 20%, posterior severe HK, anterolateral severe HK, anterior severe HK, mid to apical septal AK, AK of the true apex, apical inferior HK, restrictive physiology, trivial MR, moderate LAE, mildly reduced RV function, mild TR.  . ICD (implantable cardiac defibrillator) battery depletion 11/11/10    [employer] Jude, Dr.Allred  . Renal insufficiency     Creatinine 1.5 (GFR greater than 50)  . Headache(784.0)     Patient seen to have headaches from spironolactone.  Drug was stopped, August, 2012  . Sleepiness     has some sleepiness during the day  . 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 VT (ventricular tachycardia) (HCC)     VT storm 7/13 - Sotalol started  . Drug therapy     Sotalol started for VT storm  July, 2013  . Hypokalemia     There was some decrease in potassium with his admission in July, 2013. We will watch this very carefully.  . Disability examination     Discussion of disability September, 2013    Past Surgical History  Procedure  Laterality Date  . Tee with cardioversion  11/17/08    No LV clot  . Cardiac defibrillator placement  11/11/10    SJM Fortify DR ICD implanted by Dr Johney Frame    There were no vitals filed for this visit.      Subjective Assessment - 03/23/16 1331    Subjective "I watched the Warriors last night."   Currently in Pain? No/denies               ADULT SLP TREATMENT - 03/23/16 1335    General Information   Behavior/Cognition Alert;Cooperative;Pleasant mood   Patient Positioning Upright in chair   Oral care provided N/A   HPI [name] is a 50 yo male from [place], [place] with a past medical history of atrial fibrillation, CHF, s/p cardiac pacemaker implantation, and gout who developed right hemiplegia and global aphasia on 01/09/2016. He was taken to [facility] where his studies showed left MCA stroke. He received IV TPA at [facility] and was airlifted to [facility] as stroke code. 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 He developed SOB and was started on BIPAP during that hospitalization. He was transferred to an inpatient rehabilitation facility on 01/15/16, but showed seizure activity on 01/19/16 and was re-hospitalized. He was discharged home from that hospitalization on 01/20/2016. He  has not received speech therapy since 01/19/16 unfortunately. He was referred by Dr. Alison Murray for outpatient SLP services due to aphasia from recent stroke. He was accompanied to the appointment by [name], his wife   Treatment Provided   Treatment provided Cognitive-Linquistic   Pain Assessment   Pain Assessment No/denies pain   Cognitive-Linquistic Treatment   Treatment focused on Aphasia;Patient/family/caregiver education   Skilled Treatment pt education, compensatory strategies, word finding activities, automatic speech drills   Assessment / Recommendations / Plan   Plan Continue with current plan of care    Progression Toward Goals   Progression toward goals Progressing toward goals            SLP Short Term Goals - 03/23/16 1337    SLP SHORT TERM GOAL #1   Title Pt will increase naming of common objects/pictures to 90% acc when provided with mod/max multimodality cues    Baseline 72%   Time 8   Period Weeks   Status On-going   SLP SHORT TERM GOAL #2   Title Pt will complete single word sentence completion tasks (SWSC) with 90% acc when provided with mod multimodality cues.   Baseline 70%   Time 8   Period Weeks   Status On-going   SLP SHORT TERM GOAL #3   Title Pt will increase divergent naming to 5 items per concrete category with mod cues.   Baseline 1-2   Time 8   Period Weeks   Status On-going   SLP SHORT TERM GOAL #4   Title Pt will complete basic level reading comprehension tasks (picture cues for sentence level) with 70% acc with provided mod cues.   Baseline 25%   Time 8   Period Weeks   Status On-going   SLP SHORT TERM GOAL #5   Title Pt will answer moderate level auditory comprehension yes/no questions with 80% acc when provided mod cues.   Baseline 65%   Time 8   Period Weeks   Status On-going   SLP SHORT TERM GOAL #6   Title Pt will be able to write personal/bio information with 90% acc when provided mod cues   Baseline name only   Time 8   Period Weeks   Status On-going          SLP Long Term Goals - 03/23/16 1337    SLP LONG TERM GOAL #1   Title Pt will communicate moderate level wants/needs/thoughts to family and friends with use of multimodality communication strategies as needed.    Baseline mod/severe impairment   Time 3   Period Months   Status On-going          Plan - 03/23/16 1337    Clinical Impression Statement [name] was in good spirits today. He initiated a conversation about watching basketball last night. He named 4/4 teams correctly without cues. Session then focused on sentence generation tasks after looking at picture  scenes (verbs). SLP started a word bank of appropriate verbs (typing, washing, jumping, building, cutting, etc) in written form for pt to refer to. He generated sentences with 100% acc when SLP provided mod/max cues initially. SLP cues faded to moderate after rehearsal and with pt self cuing with use of gesture to help elicit the verb. This strategy proved extremely effective for him, however difficult to implement with nouns (understandably). Session reviewed with spouse. She inquired about him seeing a neurologist before returning to driving. I will send a fax to his doctor to request neurology  follow up if indicated.    Speech Therapy Frequency 2x / week   Duration --  8 weeks   Treatment/Interventions Language facilitation;Compensatory techniques;SLP instruction and feedback;Cueing hierarchy;Patient/family education;Compensatory strategies;Multimodal communcation approach;Functional tasks   Potential to Achieve Goals Good   Potential Considerations Severity of impairments   SLP Home Exercise Plan Pt will be independent with HEP to facilitate carryover of treatment strategies and techniques in home/community environment with assist from wife as needed.    Consulted and Agree with Plan of Care Patient;Family member/caregiver   Family Member Consulted Wife, [name]      Patient will benefit from skilled therapeutic intervention in order to improve the following deficits and impairments:   Aphasia  Dyslexia and alexia  Agraphia    Problem List Patient Active Problem List   Diagnosis Date Noted  . Lightheadedness 01/24/2014  . Nonischemic cardiomyopathy (HCC) 10/29/2013  . Chronic systolic CHF (congestive heart failure) (HCC) 04/26/2013  . Chest discomfort 04/26/2013  . Bradycardia 04/26/2013  . Automatic implantable cardioverter-defibrillator in situ 09/04/2012  . Hypokalemia   . Disability examination   . Drug therapy   . Ventricular tachyarrhythmia (HCC) 06/06/2012  .  Headache(784.0)   . Sleepiness   . Mitral regurgitation   . Cardiomyopathy (HCC)   . Ejection fraction < 50%   . Renal insufficiency    Thank you,  Havery Moros, CCC-SLP [phone]  [facility] 03/23/2016, 1:38 PM   [facility] [address] [place], [place], [address] Phone: [phone]   Fax:  [phone]   Name: Adam [name] MRN: [id number] Date of Birth: [birth year]

## 2016-03-23 NOTE — Therapy (Signed)
[place] [address] [place], [place], [address] Phone: [phone]   Fax:  [phone]  Physical Therapy Treatment  Patient Details  Name: Adam [name] MRN: [id number] Date of Birth: 18-Aug-1966 Referring Provider: Monico Blitz  Encounter Date: 03/23/2016      PT End of Session - 03/23/16 1155    Visit Number 3   Number of Visits 3   Authorization Type medicare    PT Start Time 1115   PT Stop Time 1147   PT Time Calculation (min) 32 min   Activity Tolerance Patient tolerated treatment well   Behavior During Therapy [facility] for tasks assessed/performed      Past Medical History  Diagnosis Date  . Mitral regurgitation   . NICM (nonischemic cardiomyopathy) ([place])     Nonischemic / catheterization October, 2011, normal coronary arteries  . Chronic systolic heart failure ([place])   . Ejection fraction < 50%     Echo 06/05/12: Mild LVH, EF 20%, posterior severe HK, anterolateral severe HK, anterior severe HK, mid to apical septal AK, AK of the true apex, apical inferior HK, restrictive physiology, trivial MR, moderate LAE, mildly reduced RV function, mild TR.  . ICD (implantable cardiac defibrillator) battery depletion 11/11/10    St. Jude, Dr.Allred  . Renal insufficiency     Creatinine 1.5 (GFR greater than 50)  . Headache(784.0)     Patient seen to have headaches from spironolactone.  Drug was stopped, August, 2012  . Sleepiness     has some sleepiness during the day  . 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 VT (ventricular tachycardia) ([place])     VT storm 7/13 - Sotalol started  . Drug therapy     Sotalol started for VT storm  July, 2013  . Hypokalemia     There was some decrease in potassium with his admission in July, 2013. We will watch this very carefully.  . Disability examination     Discussion of disability September, 2013    Past Surgical History  Procedure Laterality Date  . Tee with cardioversion   11/17/08    No LV clot  . Cardiac defibrillator placement  11/11/10    SJM Fortify DR ICD implanted by Dr Rayann Heman    There were no vitals filed for this visit.      Subjective Assessment - 03/23/16 1120    Subjective Adam [name] states that he feels he is doing his exercise at home.  He feels he is doing about 100% of his normal activity.  He is carrying his child in the car seat in and out of therapy    Currently in Pain? No/denies            [facility] PT Assessment - 03/23/16 0001    Assessment   Medical Diagnosis s/p CVA, R hemiplegia    Onset Date/Surgical Date 02/09/16   Hand Dominance Right   Prior Therapy 4 years ago   different stroke   Prior Function   Level of Independence Independent with household mobility with device;Independent with basic ADLs   Functional Tests   Functional tests Sit to Stand   Sit to Stand   Comments 5 sit to stand 9:12    ROM / Strength   AROM / PROM / Strength Strength   Strength   Strength Assessment Site Hip;Knee;Ankle   Right/Left Hip Right   Right Hip Flexion 5/5   Right Hip Extension 4/5  Lt is 4/5 as  well   Right Hip ABduction 5/5   Right/Left Knee Right   Right Knee Flexion 4/5  Lt is 4/5 as well    Right Knee Extension 5/5   Right/Left Ankle Right   Right Ankle Dorsiflexion 4+/5   Right Ankle Plantar Flexion 4+/5   Ambulation/Gait   Ambulation/Gait Yes   Ambulation/Gait Assistance 7: Independent   Ambulation Distance (Feet) 1532 Feet   Assistive device None   Gait Pattern Within Functional Limits   Gait velocity 4.2 feet/sec    6 minute walk test results    Aerobic Endurance Distance Walked 1356   Endurance additional comments 3.7 feet/sec   Berg Balance Test   Sit to Stand Able to stand without using hands and stabilize independently   Standing Unsupported Able to stand safely 2 minutes   Sitting with Back Unsupported but Feet Supported on Floor or Stool Able to sit safely and securely 2 minutes   Stand to Sit Sits  safely with minimal use of hands   Transfers Able to transfer safely, minor use of hands   Standing Unsupported with Eyes Closed Able to stand 10 seconds safely   Standing Ubsupported with Feet Together Able to place feet together independently and stand 1 minute safely   From Standing, Reach Forward with Outstretched Arm Can reach confidently >25 cm (10")   From Standing Position, Pick up Object from Floor Able to pick up shoe safely and easily   From Standing Position, Turn to Look Behind Over each Shoulder Looks behind from both sides and weight shifts well   Turn 360 Degrees Able to turn 360 degrees safely in 4 seconds or less   Standing Unsupported, Alternately Place Feet on Step/Stool Able to stand independently and safely and complete 8 steps in 20 seconds   Standing Unsupported, One Foot in Front Able to place foot tandem independently and hold 30 seconds   Standing on One Leg Able to lift leg independently and hold equal to or more than 3 seconds   Total Score 54                     [facility] Adult PT Treatment/Exercise - 03/23/16 0001    Transfers   Comments pt able to get up from floor without   independently    Knee/Hip Exercises: Standing   Stairs 2 round trips with no UE assist    SLS Rt 4 seconds Lt 17 seconds                   PT Short Term Goals - 03/23/16 1130    PT SHORT TERM GOAL #1   Title After 2 weeks pt will be indep in HEP.    Time 2   Period --   Status Achieved   PT SHORT TERM GOAL #2   Title After 4 weeks pt will demonstrate improved functional strength mobility in 5x STS, TUG Test, and Gait speed.    Time 4   Status Achieved   PT SHORT TERM GOAL #3   Title After 4 weeks pt will demonstrate improved balance with BBT>53.    Time 4   Status Achieved    PT SHORT TERM GOAL #4   Title After 4 weeks pt will demonstrate >3+/5 strength in RLE.    Time 4   Status Achieved           PT Long Term Goals - 03/23/16 1142    PT LONG TERM  GOAL #  1   Title After 7 weeks pt will be indep in advanced HEP for DC.    Time 7   Period Weeks   Status Achieved   PT LONG TERM GOAL #2   Title After 8 weeks pt will demonstrate improved functional strength mobility in 5x STS, TUG Test, and Gait speed.    Time 8   Period Weeks   Status Achieved   PT LONG TERM GOAL #3   Title After 8 weeks pt will demonstrate improved balance with SLS >15s bilat.    Time 8   Period Weeks   Status Partially Met   PT LONG TERM GOAL #4   Title After 8 weeks pt will demonstrate 4/5 strength or greater in RLE.    Time 8   Period Weeks   Status Achieved               Plan - Apr 15, 2016 1156    Clinical Impression Statement Pt reassessed.  Both therapist and patient agree that pt no longer needs skilled physical therapy.  He states that he is having no difficulty with any functional task at home.     PT Next Visit Plan discharge pt as he no longer needs skilled physcial therapy.     Consulted and Agree with Plan of Care Patient      Patient will benefit from skilled therapeutic intervention in order to improve the following deficits and impairments:  Abnormal gait, Decreased activity tolerance, Decreased balance, Decreased endurance, Decreased mobility, Decreased range of motion, Decreased strength, Difficulty walking, Increased muscle spasms  Visit Diagnosis: Muscle weakness (generalized)  Other symptoms and signs involving the musculoskeletal system       G-Codes - Apr 15, 2016 1158    Functional Assessment Tool Used clinical judgement    Functional Limitation Mobility: Walking and moving around   Mobility: Walking and Moving Around Goal Status [phone]) At least 1 percent but less than 20 percent impaired, limited or restricted   Mobility: Walking and Moving Around Discharge Status [phone]) At least 1 percent but less than 20 percent impaired, limited or restricted      Problem List Patient Active Problem List   Diagnosis Date Noted  .  Lightheadedness 01/24/2014  . Nonischemic cardiomyopathy ([place]) 10/29/2013  . Chronic systolic CHF (congestive heart failure) ([place]) 04/26/2013  . Chest discomfort 04/26/2013  . Bradycardia 04/26/2013  . Automatic implantable cardioverter-defibrillator in situ 09/04/2012  . Hypokalemia   . Disability examination   . Drug therapy   . Ventricular tachyarrhythmia ([place]) 06/06/2012  . Headache(784.0)   . Sleepiness   . Mitral regurgitation   . Cardiomyopathy ([place])   . Ejection fraction < 50%   . Renal insufficiency    Rayetta Humphrey, [place] CLT [phone] 04/15/16, 12:01 PM  [place] [address] [place], [place], [address] Phone: [phone]   Fax:  [phone]  Name: Adam [name] MRN: [id number] Date of Birth: [birth year]  PHYSICAL THERAPY DISCHARGE SUMMARY  Visits from Start of Care: 3  Current functional level related to goals / functional outcomes: See above    Remaining deficits: Balance    Education / Equipment: HEP  Plan: Patient agrees to discharge.  Patient goals were partially met. Patient is being discharged due to being pleased with the current functional level.  ?????       Rayetta Humphrey, [place] CLT [phone]

## 2016-03-25 ENCOUNTER — Ambulatory Visit (HOSPITAL_COMMUNITY): Payer: Medicare HMO | Admitting: Physical Therapy

## 2016-03-28 ENCOUNTER — Ambulatory Visit (HOSPITAL_COMMUNITY): Payer: Medicare HMO | Admitting: Speech Pathology

## 2016-03-28 ENCOUNTER — Telehealth (HOSPITAL_COMMUNITY): Payer: Self-pay

## 2016-03-28 DIAGNOSIS — R488 Other symbolic dysfunctions: Secondary | ICD-10-CM

## 2016-03-28 DIAGNOSIS — R4701 Aphasia: Secondary | ICD-10-CM | POA: Diagnosis not present

## 2016-03-28 DIAGNOSIS — R480 Dyslexia and alexia: Secondary | ICD-10-CM

## 2016-03-28 NOTE — Therapy (Signed)
[facility] [address] [place], [place], [address] Phone: [phone]   Fax:  [phone]  Speech Language Pathology Treatment  Patient Details  Name: Adam [name] MRN: [id number] Date of Birth: [birth year] Referring Provider: Alison Murray  Encounter Date: 03/28/2016      End of Session - 03/28/16 1507    Visit Number 14   Number of Visits 24   Date for SLP Re-Evaluation 04/04/16   Authorization Type [employer]   Authorization Time Period 02/04/2016-04/04/2016   SLP Start Time 1350   SLP Stop Time  1455   SLP Time Calculation (min) 65 min   Activity Tolerance Patient tolerated treatment well      Past Medical History  Diagnosis Date  . Mitral regurgitation   . NICM (nonischemic cardiomyopathy) (HCC)     Nonischemic / catheterization October, 2011, normal coronary arteries  . Chronic systolic heart failure (HCC)   . Ejection fraction < 50%     Echo 06/05/12: Mild LVH, EF 20%, posterior severe HK, anterolateral severe HK, anterior severe HK, mid to apical septal AK, AK of the true apex, apical inferior HK, restrictive physiology, trivial MR, moderate LAE, mildly reduced RV function, mild TR.  . ICD (implantable cardiac defibrillator) battery depletion 11/11/10    [employer] Jude, Dr.Allred  . Renal insufficiency     Creatinine 1.5 (GFR greater than 50)  . Headache(784.0)     Patient seen to have headaches from spironolactone.  Drug was stopped, August, 2012  . Sleepiness     has some sleepiness during the day  . 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 VT (ventricular tachycardia) (HCC)     VT storm 7/13 - Sotalol started  . Drug therapy     Sotalol started for VT storm  July, 2013  . Hypokalemia     There was some decrease in potassium with his admission in July, 2013. We will watch this very carefully.  . Disability examination     Discussion of disability September, 2013    Past Surgical History  Procedure  Laterality Date  . Tee with cardioversion  11/17/08    No LV clot  . Cardiac defibrillator placement  11/11/10    SJM Fortify DR ICD implanted by Dr Johney Frame    There were no vitals filed for this visit.      Subjective Assessment - 03/28/16 1504    Subjective "Looks like [name] is going to win."   Currently in Pain? No/denies               ADULT SLP TREATMENT - 03/28/16 1505    General Information   Behavior/Cognition Alert;Cooperative;Pleasant mood   Patient Positioning Upright in chair   Oral care provided N/A   HPI [name] is a 50 yo male from [place], [place] with a past medical history of atrial fibrillation, CHF, s/p cardiac pacemaker implantation, and gout who developed right hemiplegia and global aphasia on 01/09/2016. He was taken to [facility] where his studies showed left MCA stroke. He received IV TPA at [facility] and was airlifted to [facility] as stroke code. 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 He developed SOB and was started on BIPAP during that hospitalization. He was transferred to an inpatient rehabilitation facility on 01/15/16, but showed seizure activity on 01/19/16 and was re-hospitalized. He was discharged home from that hospitalization on  01/20/2016. He has not received speech therapy since 01/19/16 unfortunately. He was referred by Dr. Alison Murray for outpatient SLP services due to aphasia from recent stroke. He was accompanied to the appointment by [name], his wife   Treatment Provided   Treatment provided Cognitive-Linquistic   Pain Assessment   Pain Assessment No/denies pain   Cognitive-Linquistic Treatment   Treatment focused on Aphasia;Patient/family/caregiver education   Skilled Treatment pt education, compensatory strategies, word finding activities, automatic speech drills   Assessment / Recommendations / Plan   Plan Continue with current plan of care    Progression Toward Goals   Progression toward goals Progressing toward goals          SLP Education - 03/28/16 1506    Education provided Yes   Education Details Sent message to Dr. Sherryll Burger requesting neurology follow up as recommended by discharging MD from [facility]; D/w pt/wife   Person(s) Educated Patient;Spouse   Methods Explanation;Handout   Comprehension Verbalized understanding          SLP Short Term Goals - 03/28/16 1520    SLP SHORT TERM GOAL #1   Title Pt will increase naming of common objects/pictures to 90% acc when provided with mod/max multimodality cues    Baseline 72%   Time 8   Period Weeks   Status On-going   SLP SHORT TERM GOAL #2   Title Pt will complete single word sentence completion tasks [facility]) with 90% acc when provided with mod multimodality cues.   Baseline 70%   Time 8   Period Weeks   Status On-going   SLP SHORT TERM GOAL #3   Title Pt will increase divergent naming to 5 items per concrete category with mod cues.   Baseline 1-2   Time 8   Period Weeks   Status On-going   SLP SHORT TERM GOAL #4   Title Pt will complete basic level reading comprehension tasks (picture cues for sentence level) with 70% acc with provided mod cues.   Baseline 25%   Time 8   Period Weeks   Status On-going   SLP SHORT TERM GOAL #5   Title Pt will answer moderate level auditory comprehension yes/no questions with 80% acc when provided mod cues.   Baseline 65%   Time 8   Period Weeks   Status On-going   SLP SHORT TERM GOAL #6   Title Pt will be able to write personal/bio information with 90% acc when provided mod cues   Baseline name only   Time 8   Period Weeks   Status On-going          SLP Long Term Goals - 03/28/16 1521    SLP LONG TERM GOAL #1   Title Pt will communicate moderate level wants/needs/thoughts to family and friends with use of multimodality communication strategies as needed.    Baseline mod/severe impairment   Time 3   Period  Months   Status On-going          Plan - 03/28/16 1508    Clinical Impression Statement Mr. Ofarrell expressed that he was still trying to get an appointment with a neurologist. I faxed via EPIC my last note with a request for neurology referral and assess for benefit of antidepressant to his PCP, Dr. Sherryll Burger. I also recommended that pt/fiance call Dr. Sherryll Burger directly to request. This was recommended by the discharging physician from [facility] (per pt). I also recommended that Mr. Philipson carry a written card identifying that he had  aphasia from a stroke to carry in his wallet (provided sample for patient today). Pt completed concrete category naming activity by listing 5 items in each category with 100% acc when provided mi/mod cues from SLP. He continues to "get stuck" on thinking of/naming an item without attempts to gesture or describe. He did independently describe the item on 1 of 7 opportunities and was effective with his attempt. Paragraph length (3 sentences) story provided and pt able to read with moderate cues from SLP and answered reading comprehension questions with 80% acc when provided min cues. He occasionally voice the wrong answer, but pointed to the corret answer. Continue targeting goals.   Speech Therapy Frequency 2x / week   Duration --  8 weeks   Treatment/Interventions Language facilitation;Compensatory techniques;SLP instruction and feedback;Cueing hierarchy;Patient/family education;Compensatory strategies;Multimodal communcation approach;Functional tasks   Potential to Achieve Goals Good   Potential Considerations Severity of impairments   SLP Home Exercise Plan Pt will be independent with HEP to facilitate carryover of treatment strategies and techniques in home/community environment with assist from wife as needed.    Consulted and Agree with Plan of Care Patient;Family member/caregiver   Family Member Consulted Wife, [name]      Patient will benefit from skilled  therapeutic intervention in order to improve the following deficits and impairments:   Aphasia  Dyslexia and alexia  Agraphia    Problem List Patient Active Problem List   Diagnosis Date Noted  . Lightheadedness 01/24/2014  . Nonischemic cardiomyopathy (HCC) 10/29/2013  . Chronic systolic CHF (congestive heart failure) (HCC) 04/26/2013  . Chest discomfort 04/26/2013  . Bradycardia 04/26/2013  . Automatic implantable cardioverter-defibrillator in situ 09/04/2012  . Hypokalemia   . Disability examination   . Drug therapy   . Ventricular tachyarrhythmia (HCC) 06/06/2012  . Headache(784.0)   . Sleepiness   . Mitral regurgitation   . Cardiomyopathy (HCC)   . Ejection fraction < 50%   . Renal insufficiency    Thank you,  Havery Moros, CCC-SLP [phone]  [facility] 03/28/2016, 3:22 PM  [facility] [address] [place], [place], [address] Phone: [phone]   Fax:  [phone]   Name: Absalom Aro Stachnik MRN: [id number] Date of Birth: 1966/06/21

## 2016-03-30 ENCOUNTER — Ambulatory Visit (HOSPITAL_COMMUNITY): Payer: Medicare HMO | Admitting: Speech Pathology

## 2016-03-30 ENCOUNTER — Ambulatory Visit (HOSPITAL_COMMUNITY): Payer: Medicare HMO

## 2016-03-30 ENCOUNTER — Encounter (HOSPITAL_COMMUNITY): Payer: Self-pay | Admitting: Occupational Therapy

## 2016-03-30 ENCOUNTER — Ambulatory Visit (HOSPITAL_COMMUNITY): Payer: Medicare HMO | Admitting: Occupational Therapy

## 2016-03-30 DIAGNOSIS — R480 Dyslexia and alexia: Secondary | ICD-10-CM

## 2016-03-30 DIAGNOSIS — R488 Other symbolic dysfunctions: Secondary | ICD-10-CM

## 2016-03-30 DIAGNOSIS — R4701 Aphasia: Secondary | ICD-10-CM | POA: Diagnosis not present

## 2016-03-30 DIAGNOSIS — R278 Other lack of coordination: Secondary | ICD-10-CM

## 2016-03-30 DIAGNOSIS — R29898 Other symptoms and signs involving the musculoskeletal system: Secondary | ICD-10-CM

## 2016-03-30 NOTE — Therapy (Signed)
[facility] [address] [place], [place], [address] Phone: [phone]   Fax:  [phone]  Occupational Therapy Treatment  Patient Details  Name: Adam [name] MRN: [id number] Date of Birth: 1966-01-17 Referring Provider: Dr. Kirstie Peri  Encounter Date: 03/30/2016      OT End of Session - 03/30/16 1128    Visit Number 2   Number of Visits 8   Date for OT Re-Evaluation 04/09/16   Authorization Type [employer]   Authorization Time Period Before 10th visit   Authorization - Visit Number 2   Authorization - Number of Visits 10   OT Start Time 1035   OT Stop Time 1118   OT Time Calculation (min) 43 min   Behavior During Therapy [facility] for tasks assessed/performed      Past Medical History  Diagnosis Date  . Mitral regurgitation   . NICM (nonischemic cardiomyopathy) (HCC)     Nonischemic / catheterization October, 2011, normal coronary arteries  . Chronic systolic heart failure (HCC)   . Ejection fraction < 50%     Echo 06/05/12: Mild LVH, EF 20%, posterior severe HK, anterolateral severe HK, anterior severe HK, mid to apical septal AK, AK of the true apex, apical inferior HK, restrictive physiology, trivial MR, moderate LAE, mildly reduced RV function, mild TR.  . ICD (implantable cardiac defibrillator) battery depletion 11/11/10    [employer]. Jude, Dr.Allred  . Renal insufficiency     Creatinine 1.5 (GFR greater than 50)  . Headache(784.0)     Patient seen to have headaches from spironolactone.  Drug was stopped, August, 2012  . Sleepiness     has some sleepiness during the day  . 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 VT (ventricular tachycardia) (HCC)     VT storm 7/13 - Sotalol started  . Drug therapy     Sotalol started for VT storm  July, 2013  . Hypokalemia     There was some decrease in potassium with his admission in July, 2013. We will watch this very carefully.  . Disability examination     Discussion of  disability September, 2013    Past Surgical History  Procedure Laterality Date  . Tee with cardioversion  11/17/08    No LV clot  . Cardiac defibrillator placement  11/11/10    SJM Fortify DR ICD implanted by Dr Johney Frame    There were no vitals filed for this visit.      Subjective Assessment - 03/30/16 1039    Currently in Pain? No/denies            [facility] OT Assessment - 03/30/16 1038    Assessment   Diagnosis s/p left MCA CVA with right hemiplegia   Precautions   Precautions None                  OT Treatments/Exercises (OP) - 03/30/16 1039    Exercises   Exercises Hand   Hand Exercises   Hand Gripper with Large Beads 6/6 gripper at 35#   Hand Gripper with Medium Beads 13/13 gripper set at 35#   Hand Gripper with Small Beads 17/17 gripper set at #35  gripper held level with table   Small Pegboard Pt completed small pegboard task using tweezers to place pegs into board according to pattern provided. Pt initially grasped peg around circumference of peg, requiring increased time to complete. OT cued pt to grasp peg with tweezers inside and outside of the  top of the peg. Pt with increased success placing pegs.    Fine Motor Coordination   Fine Motor Coordination Picking up coins;Manipulating coins   Picking up coins Pt picked up coins from tabletop using right thumb and first finger, min difficulty. Picked up in sets of 5 and 10.    Manipulating coins Pt held groups of coins in sets of 5 and 10, in palm and worked on palm to fingertip translation of coins. Mod difficulty, OT cued pt to work on using thumb and digits for translation versus shaking hand. Pt then placed coins into slotted container, min difficulty.                   OT Short Term Goals - 03/30/16 1131    OT SHORT TERM GOAL #1   Title Pt will be provided with and educated on HEP.    Time 4   Period Weeks   Status On-going   OT SHORT TERM GOAL #2   Title Pt will return to prior level of  functioning and independence during daily and leisure tasks.    Time 4   Period Weeks   Status On-going   OT SHORT TERM GOAL #3   Title Pt will increase right grip strength by 15# to increase ability to carry weighted objects such as a gallon of milk.    Time 4   Period Weeks   Status On-going   OT SHORT TERM GOAL #4   Title Pt will increase right pinch strength by 6# to increase ability to hold onto and use utensils with right hand as dominant.    Time 4   Period Weeks   Status On-going   OT SHORT TERM GOAL #5   Title Pt will increase fine motor coordination to improve ability to complete daily tasks such as tying shoes and completing handwriting tasks.    Time 4   Period Weeks   Status On-going                  Plan - 03/30/16 1128    Clinical Impression Statement A: Initated grip strengthening, fine motor coordination activities, and in-hand manipulation this session. Pt reports he is not having difficulty with daily tasks at home, however required significantly increased time to perform fine motor coordination tasks this session. Pt attempted gripper set to 45# initially this session, however was unable to complete. OT cued pt for technique during fine motor and grip strengthening tasks.    Rehab Potential Good   OT Frequency 2x / week   OT Duration 4 weeks   OT Treatment/Interventions Self-care/ADL training;DME and/or AE instruction;Patient/family education;Cryotherapy;Electrical Stimulation;Moist Heat;Therapeutic exercise;Manual Therapy;Therapeutic activities   Plan P: Add pinch task with high resistance sponges and clothespins, handwriting task.    Consulted and Agree with Plan of Care Patient      Patient will benefit from skilled therapeutic intervention in order to improve the following deficits and impairments:  Decreased strength, Impaired UE functional use  Visit Diagnosis: Other symptoms and signs involving the musculoskeletal system  Other lack of  coordination    Problem List Patient Active Problem List   Diagnosis Date Noted  . Lightheadedness 01/24/2014  . Nonischemic cardiomyopathy (HCC) 10/29/2013  . Chronic systolic CHF (congestive heart failure) (HCC) 04/26/2013  . Chest discomfort 04/26/2013  . Bradycardia 04/26/2013  . Automatic implantable cardioverter-defibrillator in situ 09/04/2012  . Hypokalemia   . Disability examination   . Drug therapy   . Ventricular tachyarrhythmia (  HCC) 06/06/2012  . Headache(784.0)   . Sleepiness   . Mitral regurgitation   . Cardiomyopathy (HCC)   . Ejection fraction < 50%   . Renal insufficiency     Ezra Sites, OTR/L  [phone]  03/30/2016, 11:32 AM  [facility] [address] [place], [place], [address] Phone: [phone]   Fax:  [phone]  Name: Adam [name] MRN: [id number] Date of Birth: 07/30/1966

## 2016-03-30 NOTE — Therapy (Signed)
[place], [place], [address] Phone: [phone]   Fax:  [phone]  Speech Language Pathology Treatment  Patient Details  Name: Adam [name] MRN: [id number] Date of Birth: 02/08/1966 Referring Provider: Leandro Reasoner  Encounter Date: 03/30/2016      End of Session - 03/30/16 1340    Visit Number 15   Number of Visits 24   Date for SLP Re-Evaluation 04/04/16   Authorization Type [employer]   Authorization Time Period 02/04/2016-04/04/2016   SLP Start Time 0945   SLP Stop Time  1033   SLP Time Calculation (min) 48 min   Activity Tolerance Patient tolerated treatment well      Past Medical History  Diagnosis Date  . Mitral regurgitation   . NICM (nonischemic cardiomyopathy) ([place])     Nonischemic / catheterization October, 2011, normal coronary arteries  . Chronic systolic heart failure ([place])   . Ejection fraction < 50%     Echo 06/05/12: Mild LVH, EF 20%, posterior severe HK, anterolateral severe HK, anterior severe HK, mid to apical septal AK, AK of the true apex, apical inferior HK, restrictive physiology, trivial MR, moderate LAE, mildly reduced RV function, mild TR.  . ICD (implantable cardiac defibrillator) battery depletion 11/11/10    St. Jude, Dr.Allred  . Renal insufficiency     Creatinine 1.5 (GFR greater than 50)  . Headache(784.0)     Patient seen to have headaches from spironolactone.  Drug was stopped, August, 2012  . Sleepiness     has some sleepiness during the day  . 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 VT (ventricular tachycardia) ([place])     VT storm 7/13 - Sotalol started  . Drug therapy     Sotalol started for VT storm  July, 2013  . Hypokalemia     There was some decrease in potassium with his admission in July, 2013. We will watch this very carefully.  . Disability examination     Discussion of disability September, 2013    Past Surgical History  Procedure  Laterality Date  . Tee with cardioversion  11/17/08    No LV clot  . Cardiac defibrillator placement  11/11/10    SJM Fortify DR ICD implanted by Dr Rayann Heman    There were no vitals filed for this visit.      Subjective Assessment - 03/30/16 1339    Subjective "I am doing alright."   Currently in Pain? No/denies               ADULT SLP TREATMENT - 03/30/16 1339    General Information   Behavior/Cognition Alert;Cooperative;Pleasant mood   Patient Positioning Upright in chair   Oral care provided N/A   HPI [name] is a 50 yo male from [place], [place] with a past medical history of atrial fibrillation, CHF, s/p cardiac pacemaker implantation, and gout who developed right hemiplegia and global aphasia on 01/09/2016. He was taken to [facility] where his studies showed left MCA stroke. He received IV TPA at [facility] and was airlifted to [facility] as stroke code. 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 He developed SOB and was started on BIPAP during that hospitalization. He was transferred to an inpatient rehabilitation facility on 01/15/16, but showed seizure activity on 01/19/16 and was re-hospitalized. He was discharged home from that hospitalization on 01/20/2016. He has not  received speech therapy since 01/19/16 unfortunately. He was referred by Dr. Leandro Reasoner for outpatient SLP services due to aphasia from recent stroke. He was accompanied to the appointment by Adam [name], his wife   Treatment Provided   Treatment provided Cognitive-Linquistic   Pain Assessment   Pain Assessment No/denies pain   Cognitive-Linquistic Treatment   Treatment focused on Aphasia;Patient/family/caregiver education   Skilled Treatment pt education, compensatory strategies, word finding activities, automatic speech drills   Assessment / Recommendations / [place] with current plan of care   Progression Toward  Goals   Progression toward goals Progressing toward goals            SLP Short Term Goals - 03/30/16 1342    SLP SHORT TERM GOAL #1   Title Pt will increase naming of common objects/pictures to 90% acc when provided with mod/max multimodality cues   change to 90% acc with min assist   Baseline 72%   Time 8   Period Weeks   Status Achieved   SLP SHORT TERM GOAL #2   Title Pt will complete single word sentence completion tasks ([place]) with 90% acc when provided with mod multimodality cues.   Baseline 70%   Time 8   Period Weeks   Status Achieved   SLP SHORT TERM GOAL #3   Title Pt will increase divergent naming to 5 items per concrete category with mod cues.  change to 5-7 items with min cues   Baseline 1-2   Time 8   Period Weeks   Status Achieved   SLP SHORT TERM GOAL #4   Title Pt will complete basic level reading comprehension tasks (picture cues for sentence level) with 70% acc with provided mod cues.  change to basic sentence level with multiple choice response with 90% with mi/mod cues   Baseline 25%   Time 8   Period Weeks   Status Achieved   SLP SHORT TERM GOAL #5   Title Pt will answer moderate level auditory comprehension yes/no questions with 80% acc when provided mod cues.   Baseline 65%   Time 8   Period Weeks   Status Achieved   Additional Short Term Goals   Additional Short Term Goals Yes   SLP SHORT TERM GOAL #6   Title Pt will be able to write personal/bio information with 90% acc when provided mod cues  Continue goal   Baseline name only   Time 8   Period Weeks   Status Partially Met   SLP SHORT TERM GOAL #7   Title Pt will generate 5-7 sentence when looking at dynamic picture with 80% acc when provided mod cues.   Baseline 75% acc with mod/max assist   Time 8   Period Weeks   Status New   SLP SHORT TERM GOAL #8   Title Pt will use compensatory strategies (gesture, spell, write, describe) with min prompts on 75% of opportunities in  conversations   Baseline mod/max prompts to use total communication strategies   Time 8   Period Weeks   Status New          SLP Long Term Goals - 03/30/16 1354    SLP LONG TERM GOAL #1   Title Pt will communicate moderate level wants/needs/thoughts to family and friends with use of multimodality communication strategies as needed.    Baseline mod/severe impairment   Time 3   Period Months   Status On-going  Plan - 03/30/16 1340    Clinical Impression Statement [name] reports that his fiance left a message for Dr. Manuella [name] regarding neurology referral. Goals updated this session. Pt demonstrates excellent progress toward all goals. He met 5/6 goals. Deficits in verbal expression, written expression, and reading comprehension persist. Pt would benefit from SLP therapy 3x/week if he is able to do so. He has difficulty completing exercises at home without assist from SLP due to severity and nature of deficits. Perseveration continues to be a hindrance toward independence with home exercise program. He works very hard and continues to be extremely motivated. He was told that he needs to see a neurologist to release him to drive, but does not have an appointment with one. He would like to see a neurologist in [place]. Neurology consult may also be helpful (or follow up with physiatrist) to determine whether antidepressant may be beneficial. In session today, [name] was able to read single words aloud with 75% acc when given mi/mod cues. He was able to identify the appropriate concrete category (vegetable, occupation, etc) with 75% acc when SLP provided mod phonemic and sentence completion cues. [name] was able to add a novel item to concrete category with 100% acc when provided mod cues. Spelling continues to be challenging, but he was able to correctly state the first letter of desired words with 100% acc when given min cues. Continued skilled SLP services are strongly  recommended to increase independence with communication across all domains.    Speech Therapy Frequency 2x / week   Duration --  8 weeks   Treatment/Interventions Language facilitation;Compensatory techniques;SLP instruction and feedback;Cueing hierarchy;Patient/family education;Compensatory strategies;Multimodal communcation approach;Functional tasks   Potential to Achieve Goals Good   Potential Considerations Severity of impairments   SLP Home Exercise Plan Pt will be independent with HEP to facilitate carryover of treatment strategies and techniques in home/community environment with assist from wife as needed.    Consulted and Agree with Plan of Care Patient;Family member/caregiver   Family Member Consulted Wife, Adam [name]      Patient will benefit from skilled therapeutic intervention in order to improve the following deficits and impairments:   Aphasia  Dyslexia and alexia  Agraphia    Problem List Patient Active Problem List   Diagnosis Date Noted  . Lightheadedness 01/24/2014  . Nonischemic cardiomyopathy ([place]) 10/29/2013  . Chronic systolic CHF (congestive heart failure) ([place]) 04/26/2013  . Chest discomfort 04/26/2013  . Bradycardia 04/26/2013  . Automatic implantable cardioverter-defibrillator in situ 09/04/2012  . Hypokalemia   . Disability examination   . Drug therapy   . Ventricular tachyarrhythmia ([place]) 06/06/2012  . Headache(784.0)   . Sleepiness   . Mitral regurgitation   . Cardiomyopathy ([place])   . Ejection fraction < 50%   . Renal insufficiency    Thank you,  Genene Churn, [place]  [facility] 03/30/2016, 1:55 PM  [place] [address] [place], [place], [address] Phone: [phone]   Fax:  [phone]   Name: Adam [name] MRN: [id number] Date of Birth: 1966/08/28

## 2016-03-31 ENCOUNTER — Encounter: Payer: Self-pay | Admitting: Cardiology

## 2016-03-31 LAB — CUP PACEART REMOTE DEVICE CHECK
Battery Remaining Percentage: 49 %
Battery Voltage: 2.93 V
Brady Statistic AP VP Percent: 1 %
Brady Statistic RA Percent Paced: 2.7 %
Brady Statistic RV Percent Paced: 1 %
Date Time Interrogation Session: 20170405064257
HIGH POWER IMPEDANCE MEASURED VALUE: 72 Ohm
HighPow Impedance: 72 Ohm
Implantable Lead Implant Date: 20120105
Implantable Lead Location: 753859
Lead Channel Impedance Value: 400 Ohm
Lead Channel Pacing Threshold Amplitude: 0.75 V
Lead Channel Pacing Threshold Pulse Width: 0.4 ms
Lead Channel Sensing Intrinsic Amplitude: 11.2 mV
Lead Channel Setting Pacing Amplitude: 2 V
Lead Channel Setting Pacing Amplitude: 2.5 V
Lead Channel Setting Pacing Pulse Width: 0.4 ms
MDC IDC LEAD IMPLANT DT: 20120105
MDC IDC LEAD LOCATION: 753860
MDC IDC LEAD MODEL: 7122
MDC IDC MSMT BATTERY REMAINING LONGEVITY: 57 mo
MDC IDC MSMT LEADCHNL RA IMPEDANCE VALUE: 440 Ohm
MDC IDC MSMT LEADCHNL RA PACING THRESHOLD AMPLITUDE: 1 V
MDC IDC MSMT LEADCHNL RA PACING THRESHOLD PULSEWIDTH: 0.4 ms
MDC IDC MSMT LEADCHNL RA SENSING INTR AMPL: 4.3 mV
MDC IDC SET LEADCHNL RV SENSING SENSITIVITY: 0.5 mV
MDC IDC STAT BRADY AP VS PERCENT: 3 %
MDC IDC STAT BRADY AS VP PERCENT: 1 %
MDC IDC STAT BRADY AS VS PERCENT: 95 %
Pulse Gen Serial Number: 622431

## 2016-03-31 NOTE — Progress Notes (Signed)
Unable to continue monthly ICM follow up due to patient unable to speak due to stroke.  No DPR returned after mailing to patient.

## 2016-04-01 ENCOUNTER — Ambulatory Visit (HOSPITAL_COMMUNITY): Payer: Medicare HMO | Admitting: Occupational Therapy

## 2016-04-01 ENCOUNTER — Ambulatory Visit (HOSPITAL_COMMUNITY): Payer: Medicare HMO

## 2016-04-01 ENCOUNTER — Encounter (HOSPITAL_COMMUNITY): Payer: Self-pay | Admitting: Occupational Therapy

## 2016-04-01 DIAGNOSIS — R278 Other lack of coordination: Secondary | ICD-10-CM

## 2016-04-01 DIAGNOSIS — R4701 Aphasia: Secondary | ICD-10-CM | POA: Diagnosis not present

## 2016-04-01 DIAGNOSIS — R29898 Other symptoms and signs involving the musculoskeletal system: Secondary | ICD-10-CM

## 2016-04-01 NOTE — Therapy (Signed)
[facility] [address] [place], [place], [address] Phone: [phone]   Fax:  [phone]  Occupational Therapy Treatment  Patient Details  Name: Adam [name] MRN: [id number] Date of Birth: February 21, 1966 Referring Provider: Dr. Kirstie Peri  Encounter Date: 04/01/2016      OT End of Session - 04/01/16 1518    Visit Number 3   Number of Visits 8   Date for OT Re-Evaluation 04/09/16   Authorization Type [employer]   Authorization Time Period Before 10th visit   Authorization - Visit Number 3   Authorization - Number of Visits 10   OT Start Time 1430   OT Stop Time 1513   OT Time Calculation (min) 43 min   Behavior During Therapy [facility] for tasks assessed/performed      Past Medical History  Diagnosis Date  . Mitral regurgitation   . NICM (nonischemic cardiomyopathy) ([facility])     Nonischemic / catheterization October, 2011, normal coronary arteries  . Chronic systolic heart failure ([facility])   . Ejection fraction < 50%     Echo 06/05/12: Mild LVH, EF 20%, posterior severe HK, anterolateral severe HK, anterior severe HK, mid to apical septal AK, AK of the true apex, apical inferior HK, restrictive physiology, trivial MR, moderate LAE, mildly reduced RV function, mild TR.  . ICD (implantable cardiac defibrillator) battery depletion 11/11/10    St. Jude, Dr.Allred  . Renal insufficiency     Creatinine 1.5 (GFR greater than 50)  . Headache(784.0)     Patient seen to have headaches from spironolactone.  Drug was stopped, August, 2012  . Sleepiness     has some sleepiness during the day  . 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 VT (ventricular tachycardia) ([facility])     VT storm 7/13 - Sotalol started  . Drug therapy     Sotalol started for VT storm  July, 2013  . Hypokalemia     There was some decrease in potassium with his admission in July, 2013. We will watch this very carefully.  . Disability examination     Discussion of  disability September, 2013    Past Surgical History  Procedure Laterality Date  . Tee with cardioversion  11/17/08    No LV clot  . Cardiac defibrillator placement  11/11/10    SJM Fortify DR ICD implanted by Dr Johney Frame    There were no vitals filed for this visit.      Subjective Assessment - 04/01/16 1431    Subjective  S: I went to the doctor and he is sending [place] to the neurologist.    Currently in Pain? No/denies            [facility] OT Assessment - 04/01/16 1431    Assessment   Diagnosis s/p left MCA CVA with right hemiplegia   Precautions   Precautions None                  OT Treatments/Exercises (OP) - 04/01/16 1432    Exercises   Exercises Hand   Hand Exercises   Hand Gripper with Large Beads 6/6 gripper at 42#   Hand Gripper with Medium Beads 13/13 gripper set at 42#   Hand Gripper with Small Beads 8/17 gripper set at 42#, 9/17 gripper set at 35#   Other Hand Exercises Pt used green clothespin to grasp high resistance sponges and place into bucket. Pt required verbal cuing to open clothespin all the way  and grasp entire sponge versus just a corner. Attempted blue clothespin, pt unable to complete.    Fine Motor Coordination   Fine Motor Coordination Grooved pegs;Handwriting;Dealing card with thumb   Dealing card with thumb Pt slid cards out of left hand with right thumb, translating to a holding position between index and middle fingers. No difficulty with this task. Pt then broke deck and shuffled quickly with no difficulty.    Handwriting Pt provided with boldly lined paper for handwriting task. Pt asked to focus on using as much space between lines as he could and watching to ensure letters are written of similar size, as pt letters get smaller as hand fatigues. Line 1-pt letters gradually get smaller, line 2-pt letters are of similar size and legibility improved   Grooved pegs Pt completed grooved pegboard task sliding 5 pegs at a time into palm,  translating from palm to digits and placing into holes. Pt with min difficulty with translation, slightly increased time required to manipulate pegs into holes.                OT Education - 04/01/16 1517    Education provided Yes   Education Details Educated pt on practicing handwriting at home, copying a paragraph from a book or magazine, focusing on maintaining letter size.    Person(s) Educated Patient   Methods Explanation   Comprehension Verbalized understanding          OT Short Term Goals - 03/30/16 1131    OT SHORT TERM GOAL #1   Title Pt will be provided with and educated on HEP.    Time 4   Period Weeks   Status On-going   OT SHORT TERM GOAL #2   Title Pt will return to prior level of functioning and independence during daily and leisure tasks.    Time 4   Period Weeks   Status On-going   OT SHORT TERM GOAL #3   Title Pt will increase right grip strength by 15# to increase ability to carry weighted objects such as a gallon of milk.    Time 4   Period Weeks   Status On-going   OT SHORT TERM GOAL #4   Title Pt will increase right pinch strength by 6# to increase ability to hold onto and use utensils with right hand as dominant.    Time 4   Period Weeks   Status On-going   OT SHORT TERM GOAL #5   Title Pt will increase fine motor coordination to improve ability to complete daily tasks such as tying shoes and completing handwriting tasks.    Time 4   Period Weeks   Status On-going                  Plan - 04/01/16 1518    Clinical Impression Statement A: Added pinch task, pt unable to complete with blue clothespin but able to complete with green, verbal cuing for technique. Added handwriting task, pt began with letter size gradually lessening, ended with letters of approximately same size. Pt with min/mod difficulty with grooved pegboard, verbal cuing during pegboard and clothespin task for keeping elbow and shoulder adducted.    Rehab Potential  Good   OT Frequency 2x / week   OT Duration 4 weeks   OT Treatment/Interventions Self-care/ADL training;DME and/or AE instruction;Patient/family education;Cryotherapy;Electrical Stimulation;Moist Heat;Therapeutic exercise;Manual Therapy;Therapeutic activities   Plan P: Add fine motor board. Follow up on handwriting practice at home  Patient will benefit from skilled therapeutic intervention in order to improve the following deficits and impairments:  Decreased strength, Impaired UE functional use  Visit Diagnosis: Other symptoms and signs involving the musculoskeletal system  Other lack of coordination    Problem List Patient Active Problem List   Diagnosis Date Noted  . Lightheadedness 01/24/2014  . Nonischemic cardiomyopathy ([facility]) 10/29/2013  . Chronic systolic CHF (congestive heart failure) ([facility]) 04/26/2013  . Chest discomfort 04/26/2013  . Bradycardia 04/26/2013  . Automatic implantable cardioverter-defibrillator in situ 09/04/2012  . Hypokalemia   . Disability examination   . Drug therapy   . Ventricular tachyarrhythmia ([facility]) 06/06/2012  . Headache(784.0)   . Sleepiness   . Mitral regurgitation   . Cardiomyopathy ([facility])   . Ejection fraction < 50%   . Renal insufficiency     Ezra Sites, OTR/L  [phone]  04/01/2016, 3:21 PM  [facility] [address] [place], [place], [address] Phone: [phone]   Fax:  [phone]  Name: Adam [name] MRN: [id number] Date of Birth: May 02, 1966

## 2016-04-05 ENCOUNTER — Ambulatory Visit (HOSPITAL_COMMUNITY): Payer: Medicare HMO | Admitting: Speech Pathology

## 2016-04-05 ENCOUNTER — Ambulatory Visit (HOSPITAL_COMMUNITY): Payer: Medicare HMO

## 2016-04-05 ENCOUNTER — Encounter (HOSPITAL_COMMUNITY): Payer: Self-pay

## 2016-04-05 DIAGNOSIS — R488 Other symbolic dysfunctions: Secondary | ICD-10-CM

## 2016-04-05 DIAGNOSIS — R4701 Aphasia: Secondary | ICD-10-CM | POA: Diagnosis not present

## 2016-04-05 DIAGNOSIS — R278 Other lack of coordination: Secondary | ICD-10-CM

## 2016-04-05 DIAGNOSIS — R29898 Other symptoms and signs involving the musculoskeletal system: Secondary | ICD-10-CM

## 2016-04-05 DIAGNOSIS — R480 Dyslexia and alexia: Secondary | ICD-10-CM

## 2016-04-05 NOTE — Therapy (Signed)
[facility] [address] [place], [place], [address] Phone: [phone]   Fax:  [phone]  Speech Language Pathology Treatment  Patient Details  Name: Adam [name] MRN: [id number] Date of Birth: Nov 19, 1965 Referring Provider: Alison Murray  Encounter Date: 04/05/2016      End of Session - 04/05/16 1807    Visit Number 16   Number of Visits 24   Date for SLP Re-Evaluation 04/04/16   Authorization Type [employer]   Authorization Time Period 04/04/2016- 06/04/2016   Authorization - Visit Number 2   SLP Start Time 1432   SLP Stop Time  1516   SLP Time Calculation (min) 44 min   Activity Tolerance Patient tolerated treatment well      Past Medical History  Diagnosis Date  . Mitral regurgitation   . NICM (nonischemic cardiomyopathy) (HCC)     Nonischemic / catheterization October, 2011, normal coronary arteries  . Chronic systolic heart failure (HCC)   . Ejection fraction < 50%     Echo 06/05/12: Mild LVH, EF 20%, posterior severe HK, anterolateral severe HK, anterior severe HK, mid to apical septal AK, AK of the true apex, apical inferior HK, restrictive physiology, trivial MR, moderate LAE, mildly reduced RV function, mild TR.  . ICD (implantable cardiac defibrillator) battery depletion 11/11/10    St. Jude, Dr.Allred  . Renal insufficiency     Creatinine 1.5 (GFR greater than 50)  . Headache(784.0)     Patient seen to have headaches from spironolactone.  Drug was stopped, August, 2012  . Sleepiness     has some sleepiness during the day  . 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 VT (ventricular tachycardia) (HCC)     VT storm 7/13 - Sotalol started  . Drug therapy     Sotalol started for VT storm  July, 2013  . Hypokalemia     There was some decrease in potassium with his admission in July, 2013. We will watch this very carefully.  . Disability examination     Discussion of disability September, 2013     Past Surgical History  Procedure Laterality Date  . Tee with cardioversion  11/17/08    No LV clot  . Cardiac defibrillator placement  11/11/10    SJM Fortify DR ICD implanted by Dr Johney Frame    There were no vitals filed for this visit.      Subjective Assessment - 04/05/16 1741    Subjective "I go to see the neurologist tomorrow."   Currently in Pain? No/denies               ADULT SLP TREATMENT - 04/05/16 1741    General Information   Behavior/Cognition Alert;Cooperative;Pleasant mood   Patient Positioning Upright in chair   Oral care provided N/A   HPI [name] is a 50 yo male from [place], [place] with a past medical history of atrial fibrillation, CHF, s/p cardiac pacemaker implantation, and gout who developed right hemiplegia and global aphasia on 01/09/2016. He was taken to [facility] where his studies showed left MCA stroke. He received IV TPA at [facility] and was airlifted to [facility] as stroke code. 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 He developed SOB and was started on BIPAP during that hospitalization. He was transferred to an inpatient rehabilitation facility on 01/15/16, but showed seizure activity on 01/19/16 and was re-hospitalized. He  was discharged home from that hospitalization on 01/20/2016. He has not received speech therapy since 01/19/16 unfortunately. He was referred by Dr. Alison Murray for outpatient SLP services due to aphasia from recent stroke. He was accompanied to the appointment by [name], his wife   Treatment Provided   Treatment provided Cognitive-Linquistic   Pain Assessment   Pain Assessment No/denies pain   Cognitive-Linquistic Treatment   Treatment focused on Aphasia;Patient/family/caregiver education   Skilled Treatment pt education, compensatory strategies, word finding activities, automatic speech drills   Assessment / Recommendations / Plan   Plan  Continue with current plan of care   Progression Toward Goals   Progression toward goals Progressing toward goals            SLP Short Term Goals - 04/05/16 1827    SLP SHORT TERM GOAL #1   Title Pt will increase naming of common objects/pictures to 90% acc when provided with min assist  change to 90% acc with min assist   Baseline 72%   Time 8   Period Weeks   Status On-going   SLP SHORT TERM GOAL #2   Title Pt will complete single word sentence completion tasks (SWSC) with 90% acc when provided with mod multimodality cues.   Baseline 70%   Time 8   Period Weeks   Status Achieved   SLP SHORT TERM GOAL #3   Title Pt will increase divergent naming to 5-7 items per concrete category with min cues.  change to 5-7 items with min cues   Baseline 1-2   Time 8   Period Weeks   Status On-going   SLP SHORT TERM GOAL #4   Title Pt will complete basic sentence level reading comprehension tasks with 90% acc with provided mi/mod cues.  change to basic sentence level with multiple choice response with 90% with mi/mod cues   Baseline 25%   Time 8   Period Weeks   Status On-going   SLP SHORT TERM GOAL #5   Title Pt will answer moderate level auditory comprehension yes/no questions with 80% acc when provided mod cues.   Baseline 65%   Time 8   Period Weeks   Status Achieved   SLP SHORT TERM GOAL #6   Title Pt will be able to write personal/bio information with 90% acc when provided mod cues  Continue goal   Baseline name only   Time 8   Period Weeks   Status On-going   SLP SHORT TERM GOAL #7   Title Pt will generate 5-7 sentence when looking at dynamic picture with 80% acc when provided mod cues.   Baseline 75% acc with mod/max assist   Time 8   Period Weeks   Status On-going   SLP SHORT TERM GOAL #8   Title Pt will use compensatory strategies (gesture, spell, write, describe) with min prompts on 75% of opportunities in conversations   Baseline mod/max prompts to use total  communication strategies   Time 8   Period Weeks   Status On-going          SLP Long Term Goals - 04/05/16 1829    SLP LONG TERM GOAL #1   Title Pt will communicate moderate level wants/needs/thoughts to family and friends with use of multimodality communication strategies as needed.    Baseline mod/severe impairment   Time 3   Period Months   Status On-going          Plan - 04/05/16 1809  Clinical Impression Statement Mr. Coupe verbalized that he saw Dr. Sherryll Burger last week and has an appointment with Dr. Everlena Cooper in [place] tomorrow. He is hoping that he will be released to drive. SLP gave pt Trails A (connecting #s 1-25) and he completed with 100% acc in 85 seconds (average completion time is about 30 seconds). Trails B attempted, however inappropriate due to aphasia/alexia. He did not appear to demonstrate an visual inattention during the task. Pt and SLP role-played getting pulled over by a police officer and pt able to verbalize his name and where he was heading. He did not mention that he had a stroke and we discussed whether this would be prudent to do should he get pulled over. He verbalized that he probably would tell the officer that he had a stroke. He also has an index card that I wrote a message about having had a stroke. Pt's fiance pulled me aside today and expressed concerns about pt being irritable with her at home. She feels like he is avoiding social situations with friends and then taking things out on her. I attempted to broach this subject today with Mr. Oleson and he stated "everything is fine". He did admit to not socializing with friends as much as before stroke. SLP inquired about trying an antidepressant if MD recommended and he said that he would absolutely consider it. He continues to make tremendous progress toward expressive language goals, but would benefit from increased frequency of SLP visits (from 2-3x/week) if able. He completed confrontation naming task with  100% acc with occasional/min cue, filled in missing letters of common words with 100% acc and min cue and was able to complete reading comprehension activity of identifying correct word in category with 94% acc. Continue plan of care.    Speech Therapy Frequency 3x / week   Duration --  8 weeks   Treatment/Interventions Language facilitation;Compensatory techniques;SLP instruction and feedback;Cueing hierarchy;Patient/family education;Compensatory strategies;Multimodal communcation approach;Functional tasks   Potential to Achieve Goals Good   Potential Considerations Severity of impairments   SLP Home Exercise Plan Pt will be independent with HEP to facilitate carryover of treatment strategies and techniques in home/community environment with assist from wife as needed.    Consulted and Agree with Plan of Care Patient;Family member/caregiver   Family Member Consulted Wife, [name]      Patient will benefit from skilled therapeutic intervention in order to improve the following deficits and impairments:   Aphasia  Dyslexia and alexia  Agraphia    Problem List Patient Active Problem List   Diagnosis Date Noted  . Lightheadedness 01/24/2014  . Nonischemic cardiomyopathy (HCC) 10/29/2013  . Chronic systolic CHF (congestive heart failure) (HCC) 04/26/2013  . Chest discomfort 04/26/2013  . Bradycardia 04/26/2013  . Automatic implantable cardioverter-defibrillator in situ 09/04/2012  . Hypokalemia   . Disability examination   . Drug therapy   . Ventricular tachyarrhythmia (HCC) 06/06/2012  . Headache(784.0)   . Sleepiness   . Mitral regurgitation   . Cardiomyopathy (HCC)   . Ejection fraction < 50%   . Renal insufficiency    Thank you,  Havery Moros, CCC-SLP [id number]  [facility] 04/05/2016, 6:30 PM  [facility] [address] [place], [place], [address] Phone: [phone]   Fax:  [phone]   Name: Miykael Sarte  Sterry MRN: [id number] Date of Birth: 05/14/1966

## 2016-04-05 NOTE — Therapy (Signed)
[facility] [address] [place], [place], [address] Phone: [phone]   Fax:  [phone]  Occupational Therapy Treatment  Patient Details  Name: Adam [name] MRN: [id number] Date of Birth: 1966-05-18 Referring Provider: Dr. Kirstie Peri  Encounter Date: 04/05/2016      OT End of Session - 04/05/16 1504    Visit Number 4   Number of Visits 8   Date for OT Re-Evaluation 04/09/16   Authorization Type [employer]   Authorization Time Period Before 10th visit   Authorization - Visit Number 4   Authorization - Number of Visits 10   OT Start Time 1345   OT Stop Time 1430   OT Time Calculation (min) 45 min   Activity Tolerance Patient tolerated treatment well   Behavior During Therapy [facility] for tasks assessed/performed      Past Medical History  Diagnosis Date  . Mitral regurgitation   . NICM (nonischemic cardiomyopathy) (HCC)     Nonischemic / catheterization October, 2011, normal coronary arteries  . Chronic systolic heart failure (HCC)   . Ejection fraction < 50%     Echo 06/05/12: Mild LVH, EF 20%, posterior severe HK, anterolateral severe HK, anterior severe HK, mid to apical septal AK, AK of the true apex, apical inferior HK, restrictive physiology, trivial MR, moderate LAE, mildly reduced RV function, mild TR.  . ICD (implantable cardiac defibrillator) battery depletion 11/11/10    St. Jude, Dr.Allred  . Renal insufficiency     Creatinine 1.5 (GFR greater than 50)  . Headache(784.0)     Patient seen to have headaches from spironolactone.  Drug was stopped, August, 2012  . Sleepiness     has some sleepiness during the day  . 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 VT (ventricular tachycardia) (HCC)     VT storm 7/13 - Sotalol started  . Drug therapy     Sotalol started for VT storm  July, 2013  . Hypokalemia     There was some decrease in potassium with his admission in July, 2013. We will watch this very  carefully.  . Disability examination     Discussion of disability September, 2013    Past Surgical History  Procedure Laterality Date  . Tee with cardioversion  11/17/08    No LV clot  . Cardiac defibrillator placement  11/11/10    SJM Fortify DR ICD implanted by Dr Johney Frame    There were no vitals filed for this visit.      Subjective Assessment - 04/05/16 1355    Subjective  S: My hand is doing well.   Currently in Pain? No/denies            [facility] OT Assessment - 04/05/16 1355    Assessment   Diagnosis s/p left MCA CVA with right hemiplegia   Precautions   Precautions None                  OT Treatments/Exercises (OP) - 04/05/16 1355    Exercises   Exercises Hand   Hand Exercises   Hand Gripper with Large Beads 6/6 gripper at 42#   Hand Gripper with Medium Beads 13/13 gripper set at 42#   Hand Gripper with Small Beads 8/17 beads with gripper at 42#,    Other Hand Exercises Pt completed fine motor coordination board with min difficulty with the exception of the push clamps holding the keys. Pt had mod-max difficulty with manipulation.  Other Hand Exercises Pt complete Purdue Pegboard using tweezers in right hand to place small items. Mod-max difficulty with coordination requiring increased time.                   OT Short Term Goals - 03/30/16 1131    OT SHORT TERM GOAL #1   Title Pt will be provided with and educated on HEP.    Time 4   Period Weeks   Status On-going   OT SHORT TERM GOAL #2   Title Pt will return to prior level of functioning and independence during daily and leisure tasks.    Time 4   Period Weeks   Status On-going   OT SHORT TERM GOAL #3   Title Pt will increase right grip strength by 15# to increase ability to carry weighted objects such as a gallon of milk.    Time 4   Period Weeks   Status On-going   OT SHORT TERM GOAL #4   Title Pt will increase right pinch strength by 6# to increase ability to hold onto and use  utensils with right hand as dominant.    Time 4   Period Weeks   Status On-going   OT SHORT TERM GOAL #5   Title Pt will increase fine motor coordination to improve ability to complete daily tasks such as tying shoes and completing handwriting tasks.    Time 4   Period Weeks   Status On-going                  Plan - 04/05/16 1505    Clinical Impression Statement A: Pt had increased difficulty with fine motor task using tweezers as well as picking up small beads with gripper. VC for form and technique to aid in completion.    Plan P: Continue to work on fine motor coordination and grip strengthening.       Patient will benefit from skilled therapeutic intervention in order to improve the following deficits and impairments:  Decreased strength, Impaired UE functional use  Visit Diagnosis: Other symptoms and signs involving the musculoskeletal system  Other lack of coordination    Problem List Patient Active Problem List   Diagnosis Date Noted  . Lightheadedness 01/24/2014  . Nonischemic cardiomyopathy (HCC) 10/29/2013  . Chronic systolic CHF (congestive heart failure) (HCC) 04/26/2013  . Chest discomfort 04/26/2013  . Bradycardia 04/26/2013  . Automatic implantable cardioverter-defibrillator in situ 09/04/2012  . Hypokalemia   . Disability examination   . Drug therapy   . Ventricular tachyarrhythmia (HCC) 06/06/2012  . Headache(784.0)   . Sleepiness   . Mitral regurgitation   . Cardiomyopathy (HCC)   . Ejection fraction < 50%   . Renal insufficiency     Limmie Patricia, OTR/L,CBIS  [phone]  04/05/2016, 3:10 PM  [facility] [address] [place], [place], [address] Phone: [phone]   Fax:  [phone]  Name: Adam [name] MRN: [id number] Date of Birth: 1966/01/27

## 2016-04-06 ENCOUNTER — Ambulatory Visit (HOSPITAL_COMMUNITY): Payer: Medicare HMO | Admitting: Occupational Therapy

## 2016-04-06 ENCOUNTER — Ambulatory Visit (INDEPENDENT_AMBULATORY_CARE_PROVIDER_SITE_OTHER): Payer: Medicare HMO | Admitting: Neurology

## 2016-04-06 ENCOUNTER — Ambulatory Visit (HOSPITAL_COMMUNITY): Payer: Medicare HMO

## 2016-04-06 ENCOUNTER — Encounter: Payer: Self-pay | Admitting: Neurology

## 2016-04-06 VITALS — BP 122/64 | HR 64 | Ht 71.0 in | Wt 235.0 lb

## 2016-04-06 DIAGNOSIS — R29898 Other symptoms and signs involving the musculoskeletal system: Secondary | ICD-10-CM

## 2016-04-06 DIAGNOSIS — I69359 Hemiplegia and hemiparesis following cerebral infarction affecting unspecified side: Secondary | ICD-10-CM | POA: Diagnosis not present

## 2016-04-06 DIAGNOSIS — I69920 Aphasia following unspecified cerebrovascular disease: Secondary | ICD-10-CM | POA: Diagnosis not present

## 2016-04-06 DIAGNOSIS — R569 Unspecified convulsions: Secondary | ICD-10-CM

## 2016-04-06 DIAGNOSIS — I4891 Unspecified atrial fibrillation: Secondary | ICD-10-CM | POA: Diagnosis not present

## 2016-04-06 DIAGNOSIS — I69320 Aphasia following cerebral infarction: Secondary | ICD-10-CM

## 2016-04-06 DIAGNOSIS — R4701 Aphasia: Secondary | ICD-10-CM

## 2016-04-06 DIAGNOSIS — R278 Other lack of coordination: Secondary | ICD-10-CM

## 2016-04-06 NOTE — Therapy (Signed)
[facility] [address] [place], [place], [address] Phone: [phone]   Fax:  [phone]  Occupational Therapy Treatment  Patient Details  Name: Adam [name] MRN: [id number] Date of Birth: July 06, 1966 Referring Provider: Dr. Kirstie Peri  Encounter Date: 04/06/2016      OT End of Session - 04/06/16 1407    Visit Number 5   Number of Visits 8   Date for OT Re-Evaluation 04/09/16   Authorization Type [employer]   Authorization Time Period Before 10th visit   Authorization - Visit Number 5   Authorization - Number of Visits 10   OT Start Time 1308   OT Stop Time 1355   OT Time Calculation (min) 47 min   Activity Tolerance Patient tolerated treatment well   Behavior During Therapy [facility] for tasks assessed/performed      Past Medical History  Diagnosis Date  . Mitral regurgitation   . NICM (nonischemic cardiomyopathy) (HCC)     Nonischemic / catheterization October, 2011, normal coronary arteries  . Chronic systolic heart failure (HCC)   . Ejection fraction < 50%     Echo 06/05/12: Mild LVH, EF 20%, posterior severe HK, anterolateral severe HK, anterior severe HK, mid to apical septal AK, AK of the true apex, apical inferior HK, restrictive physiology, trivial MR, moderate LAE, mildly reduced RV function, mild TR.  . ICD (implantable cardiac defibrillator) battery depletion 11/11/10    St. Jude, Dr.Allred  . Renal insufficiency     Creatinine 1.5 (GFR greater than 50)  . Headache(784.0)     Patient seen to have headaches from spironolactone.  Drug was stopped, August, 2012  . Sleepiness     has some sleepiness during the day  . 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 VT (ventricular tachycardia) (HCC)     VT storm 7/13 - Sotalol started  . Drug therapy     Sotalol started for VT storm  July, 2013  . Hypokalemia     There was some decrease in potassium with his admission in July, 2013. We will watch this very  carefully.  . Disability examination     Discussion of disability September, 2013    Past Surgical History  Procedure Laterality Date  . Tee with cardioversion  11/17/08    No LV clot  . Cardiac defibrillator placement  11/11/10    SJM Fortify DR ICD implanted by Dr Johney Frame    There were no vitals filed for this visit.      Subjective Assessment - 04/06/16 1311    Subjective  S: Doing pretty good    Pertinent History Pt is a 50 y/o male s/p left MCA CVA on 01/09/16 with resulting right hemiplegia. Pt did not receive any rehab services after discharge on 01/20/16. Pt was referred to occupational therapy for evaluation and treatment by Dr. Kirstie Peri.    Patient Stated Goals To use this right hand without tiring.    Currently in Pain? No/denies            [facility] OT Assessment - 04/05/16 1355    Assessment   Diagnosis s/p left MCA CVA with right hemiplegia   Precautions   Precautions None                  OT Treatments/Exercises (OP) - 04/06/16 0001    Cognitive Exercises   Listing Asked patient questions and asked him to write down his answers to focus on problem  solving, handwritting, attention, and sequencing. (1) where would you go to find groceries - pt able to answer "[employer]", but with increased difficulty writing "[employer]" & spelling on paper. (2) Where would you go to see animals  - pts immediate response "zoo", he wrote "zoom" on paper (3) Where can you go to shop for clothes - pt responded "roses" with increased time, pt wrote "roses" with increased time and min cueing. (4) Where do you get your drivers license - pt responded "[employer]" and independently wrote this.    Other Cognitive Exercises 1 Started "Dictionary" cognitive worksheet to work on critical thinking, problem solving, and handwriting. Would like to continue this worksheet. Gave worksheet to patient's next treating therapist.    Fine Motor Coordination   Fine Motor Coordination Picking up coins;Stacking  coins;Manipulating coins   Picking up coins Pt picked up pennies, 3 at a time   Manipulating coins Pt picked up stacked coins (3) and engaged in in-hand manipulation to put pennies in container   Stacking coins Pt stacked coins in sets of 3   Handwriting Pt traced squiggly/unorganized lines for focus on attention, problem solving, strength, and functional use of right hand. Pt also engaged in handwriting activity of writing his name, address, phone number - focusing on consistency with space and size as well as functional use and strength of RUE  Pt unable to remember address, pulled out ID - max difficulty   Other Fine Motor Exercises Pt squeezed blue and black (graded) pins using pincer grasp and reached low, mid, high levels   Other Fine Motor Exercises Pt squeezed blue and black graded pins using three point pinch to take pins down from low, mid, high levels   Neurological Re-education Exercises   Hand Gripper with Small Beads Pt picked up small beads using black hole gripper with minimal spillage                   OT Short Term Goals - 04/06/16 1406    OT SHORT TERM GOAL #1   Title Pt will be provided with and educated on HEP.    Time 4   Period Weeks   Status On-going   OT SHORT TERM GOAL #2   Title Pt will return to prior level of functioning and independence during daily and leisure tasks.    Time 4   Period Weeks   Status On-going   OT SHORT TERM GOAL #3   Title Pt will increase right grip strength by 15# to increase ability to carry weighted objects such as a gallon of milk.    Time 4   Period Weeks   Status On-going   OT SHORT TERM GOAL #4   Title Pt will increase right pinch strength by 6# to increase ability to hold onto and use utensils with right hand as dominant.    Time 4   Period Weeks   Status On-going   OT SHORT TERM GOAL #5   Title Pt will increase fine motor coordination to improve ability to complete daily tasks such as tying shoes and completing  handwriting tasks.    Time 4   Period Weeks   Status On-going                  Plan - 04/06/16 1404    Clinical Impression Statement A: Pt with increased difficulty with handwriting tasks from a cognitive standpoint. Pt with difficulty writing down what he was saying. Pt with difficulty sequencing when  attempting to write his address.    Rehab Potential Good   OT Frequency 2x / week   OT Duration 4 weeks   OT Treatment/Interventions Self-care/ADL training;DME and/or AE instruction;Patient/family education;Cryotherapy;Electrical Stimulation;Moist Heat;Therapeutic exercise;Manual Therapy;Therapeutic activities   Plan P: Continue working on fine motor coordination and grip strengthening. Continue "Dictionary" worksheet and writing down responses to questions (he had difficult with this)    Consulted and Agree with Plan of Care Patient      Patient will benefit from skilled therapeutic intervention in order to improve the following deficits and impairments:  Decreased strength, Impaired UE functional use  Visit Diagnosis: Other symptoms and signs involving the musculoskeletal system  Other lack of coordination  Aphasia    Problem List Patient Active Problem List   Diagnosis Date Noted  . Lightheadedness 01/24/2014  . Nonischemic cardiomyopathy (HCC) 10/29/2013  . Chronic systolic CHF (congestive heart failure) (HCC) 04/26/2013  . Chest discomfort 04/26/2013  . Bradycardia 04/26/2013  . Automatic implantable cardioverter-defibrillator in situ 09/04/2012  . Hypokalemia   . Disability examination   . Drug therapy   . Ventricular tachyarrhythmia (HCC) 06/06/2012  . Headache(784.0)   . Sleepiness   . Mitral regurgitation   . Cardiomyopathy (HCC)   . Ejection fraction < 50%   . Renal insufficiency     Edwin Cap , MS, OTR/L, CLT  04/06/2016, 2:09 PM  [facility] [address] [place], [place], [address] Phone:  [phone]   Fax:  [phone]  Name: Adam [name] MRN: [id number] Date of Birth: Mar 23, 1966

## 2016-04-06 NOTE — Progress Notes (Signed)
NEUROLOGY CONSULTATION NOTE  Adam [name] MRN: [id number] DOB: [birth year]  Referring provider: Dr. Sherryll Burger Primary care provider: Dr. Sherryll Burger  Reason for consult:  stroke  HISTORY OF PRESENT ILLNESS: Adam [name] is a 50 year old right-handed man with CKD stage 3, atrial fibrillation status post implantable pacemaker, OSA, gout, and CHF who presents for stroke.  History obtained by patient, his wife and hospital notes.  He was admitted to [facility] from 01/15/16 to 01/19/16 for stroke.  He presented with right sided hemiplegia and global aphasia on day of admission.  He was first taken to [facility] where he had a NIHSS of 15 and CT of head revealed left MCA territory ischemic stroke.  He received IV tPA and was then transferred to [facility].  On arrival, NIHSS was 3.  He has atrial fibrillation with pacemaker but was not on anticoagulation.  He was started on aspirin.  Telemetry had revealed atrial fibrillation.  CXR showed bilateral infiltrate and edema, which improved on Lasix.  Echocardiogram revealed cardiomyopathy with EF 25-30% and no PFO.  Hgb A1c from 01/10/16 was 6.2.  LDL was 164.  He was subsequently discharged on Eliquis to the [facility].  On 01/19/16, he was readmitted to the hospital for evaluation of possible seizure.  He was in the bathroom when family heard a thump.  When his family entered the bathroom, he had fallen backwards, leaning on the wall and exhibited stiffness of limbs with eyes rolled back and was unresponsive for a few minutes.  He immediately recovered.  Repeat head CT that day again revealed recent left MCA stroke and remote right PCA stroke but no new findings.  EEG was limited due to significant electrode and motion artifact, but the most readable segments were reportedly normal.  He was started on Keppra 500mg  twice daily.  He has been doing well.  He is continuing speech therapy and has made significant improvement.  He is  understanding others now.  He has some trouble getting words out with mild difficulty with reading.  He has not had any recurrent seizure.  PAST MEDICAL HISTORY: Past Medical History  Diagnosis Date  . Mitral regurgitation   . NICM (nonischemic cardiomyopathy) (HCC)     Nonischemic / catheterization October, 2011, normal coronary arteries  . Chronic systolic heart failure (HCC)   . Ejection fraction < 50%     Echo 06/05/12: Mild LVH, EF 20%, posterior severe HK, anterolateral severe HK, anterior severe HK, mid to apical septal AK, AK of the true apex, apical inferior HK, restrictive physiology, trivial MR, moderate LAE, mildly reduced RV function, mild TR.  . ICD (implantable cardiac defibrillator) battery depletion 11/11/10    St. Jude, Dr.Allred  . Renal insufficiency     Creatinine 1.5 (GFR greater than 50)  . Headache(784.0)     Patient seen to have headaches from spironolactone.  Drug was stopped, August, 2012  . Sleepiness     has some sleepiness during the day  . 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 VT (ventricular tachycardia) (HCC)     VT storm 7/13 - Sotalol started  . Drug therapy     Sotalol started for VT storm  July, 2013  . Hypokalemia     There was some decrease in potassium with his admission in July, 2013. We will watch this very carefully.  . Disability examination     Discussion of disability September, 2013    PAST SURGICAL  HISTORY: Past Surgical History  Procedure Laterality Date  . Tee with cardioversion  11/17/08    No LV clot  . Cardiac defibrillator placement  11/11/10    SJM Fortify DR ICD implanted by Dr Johney Frame    MEDICATIONS: Current Outpatient Prescriptions on File Prior to Visit  Medication Sig Dispense Refill  . atorvastatin (LIPITOR) 80 MG tablet Take 80 mg by mouth at bedtime.  0  . benazepril (LOTENSIN) 5 MG tablet TAKE ONE TABLET BY MOUTH ONCE DAILY 30 tablet 6  . carvedilol (COREG) 6.25 MG tablet TAKE ONE TABLET BY MOUTH TWICE  DAILY 60 tablet 6  . ELIQUIS 5 MG TABS tablet Take 5 mg by mouth 2 (two) times daily.  0  . esomeprazole (NEXIUM) 40 MG capsule Take 40 mg by mouth 2 (two) times daily.  0  . furosemide (LASIX) 40 MG tablet Take 1 tablet (40 mg total) by mouth 2 (two) times daily. 60 tablet 6  . levETIRAcetam (KEPPRA) 500 MG tablet Take 500 mg by mouth 2 (two) times daily.  0  . sotalol (BETAPACE) 120 MG tablet TAKE ONE TABLET BY MOUTH EVERY TWELVE HOURS. 60 tablet 6  . STOOL SOFTENER 100 MG capsule Take 100 mg by mouth 2 (two) times daily.  0   No current facility-administered medications on file prior to visit.    ALLERGIES: Allergies  Allergen Reactions  . Atorvastatin Other (See Comments)    headaches headaches    FAMILY HISTORY: Family History  Problem Relation Age of Onset  . Heart attack Father 61    Multiple MIs  . Stroke Father   . Heart attack Brother   . Heart failure Brother     CHF  . Hypertension Mother   . Stroke Mother   . Heart attack Paternal Grandfather     SOCIAL HISTORY: Social History   Social History  . Marital Status: Single    Spouse Name: N/A  . Number of Children: 5  . Years of Education: N/A   Occupational History  . [employer]     Full time   Social History Main Topics  . Smoking status: Never Smoker   . Smokeless tobacco: Never Used  . Alcohol Use: 0.0 oz/week    0 Standard drinks or equivalent per week     Comment: OCCASIONAL  (NOT HEAVY)  . Drug Use: No  . Sexual Activity: Not on file   Other Topics Concern  . Not on file   Social History Narrative   Lives in [place], [place] with fiance.   No regular exercise.    REVIEW OF SYSTEMS: Constitutional: No fevers, chills, or sweats, no generalized fatigue, change in appetite Eyes: No visual changes, double vision, eye pain Ear, nose and throat: No hearing loss, ear pain, nasal congestion, sore throat Cardiovascular: No chest pain, palpitations Respiratory:  No shortness of breath at rest or with  exertion, wheezes GastrointestinaI: No nausea, vomiting, diarrhea, abdominal pain, fecal incontinence Genitourinary:  No dysuria, urinary retention or frequency Musculoskeletal:  No neck pain, back pain Integumentary: No rash, pruritus, skin lesions Neurological: as above Psychiatric: No depression, insomnia, anxiety Endocrine: No palpitations, fatigue, diaphoresis, mood swings, change in appetite, change in weight, increased thirst Hematologic/Lymphatic:  No purpura, petechiae. Allergic/Immunologic: no itchy/runny eyes, nasal congestion, recent allergic reactions, rashes  PHYSICAL EXAM: Filed Vitals:   04/06/16 1010  BP: 122/64  Pulse: 64   General: No acute distress.  Patient appears well-groomed.  Head:  Normocephalic/atraumatic Eyes:  fundi examined  but not visualized Neck: supple, no paraspinal tenderness, full range of motion Back: No paraspinal tenderness Heart: regular rate and rhythm Lungs: Clear to auscultation bilaterally. Vascular: No carotid bruits. Neurological Exam: Mental status: alert and oriented to person, place, and time, recent and remote memory intact, fund of knowledge intact, attention and concentration intact, speech fluent and not dysarthric, language intact. Cranial nerves: CN I: not tested CN II: pupils equal, round and reactive to light, visual fields intact CN III, IV, VI:  full range of motion, no nystagmus, no ptosis CN V: facial sensation intact CN VII: upper and lower face symmetric CN VIII: hearing intact CN IX, X: gag intact, uvula midline CN XI: sternocleidomastoid and trapezius muscles intact CN XII: tongue midline Bulk & Tone: normal, no fasciculations. Motor:  Decreased finger tapping on the right.  5-/5 right grip.  Otherwise, 5/5 throughout  Sensation:  Pinprick sensation reduced in right upper extremity, vibration sensation intact. . Deep Tendon Reflexes:  2+ throughout, toes downgoing. Finger to nose testing:  Without dysmetria.    Heel to shin:  Without dysmetria.  Gait:  Normal station and stride.  Able to turn and tandem walk. Romberg negative.  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  Continue Keppra  twice daily.  Discussed [address] law stating he must be seizure-free for 6 months before he can resume driving. 2.  Eliquis for secondary stroke prevention as per cardiology 3.  Atorvastatin  as managed by PCP.  LDL goal should be less than 70. 4.  Speech therapy 5.  Follow up in mid-September (on or after the 14th) to reassess for driving  Thank you for allowing me to take part in the care of this patient.  Shon Millet, DO  CC:  Kirstie Peri, MD

## 2016-04-06 NOTE — Progress Notes (Signed)
Chart forwarded.  

## 2016-04-06 NOTE — Patient Instructions (Signed)
1. Continue Keppra (leviteracetam)  twice daily 2.  Continue Eliquis and atorvastatin  daily 3.  As per [facility], you must not have had a seizure for 6 months until you are able to drive again. 4.  Follow up in mid-September (the 6 month mark since stroke/seizure, after the 14th) 5.  Mediterranean diet    Why follow it? Research shows. . Those who follow the Mediterranean diet have a reduced risk of heart disease  . The diet is associated with a reduced incidence of Parkinson's and Alzheimer's diseases . People following the diet may have longer life expectancies and lower rates of chronic diseases  . The Dietary Guidelines for Americans recommends the Mediterranean diet as an eating plan to promote health and prevent disease  What Is the Mediterranean Diet?  . Healthy eating plan based on typical foods and recipes of Mediterranean-style cooking . The diet is primarily a plant based diet; these foods should make up a majority of meals   Starches - Plant based foods should make up a majority of meals - They are an important sources of vitamins, minerals, energy, antioxidants, and fiber - Choose whole grains, foods high in fiber and minimally processed items  - Typical grain sources include wheat, oats, barley, corn, brown rice, bulgar, farro, millet, polenta, couscous  - Various types of beans include chickpeas, lentils, fava beans, black beans, white beans   Fruits  Veggies - Large quantities of antioxidant rich fruits & veggies; 6 or more servings  - Vegetables can be eaten raw or lightly drizzled with oil and cooked  - Vegetables common to the traditional Mediterranean Diet include: artichokes, arugula, beets, broccoli, brussel sprouts, cabbage, carrots, celery, collard greens, cucumbers, eggplant, kale, leeks, lemons, lettuce, mushrooms, okra, onions, peas, peppers, potatoes, pumpkin, radishes, rutabaga, shallots, spinach, sweet potatoes, turnips, zucchini - Fruits common  to the Mediterranean Diet include: apples, apricots, avocados, cherries, clementines, dates, figs, grapefruits, grapes, melons, nectarines, oranges, peaches, pears, pomegranates, strawberries, tangerines  Fats - Replace butter and margarine with healthy oils, such as olive oil, canola oil, and tahini  - Limit nuts to no more than a handful a day  - Nuts include walnuts, almonds, pecans, pistachios, pine nuts  - Limit or avoid candied, honey roasted or heavily salted nuts - Olives are central to the [employer] - can be eaten whole or used in a variety of dishes   Meats Protein - Limiting red meat: no more than a few times a month - When eating red meat: choose lean cuts and keep the portion to the size of deck of cards - Eggs: approx. 0 to 4 times a week  - Fish and lean poultry: at least 2 a week  - Healthy protein sources include, chicken, [place], lean beef, lamb - Increase intake of seafood such as tuna, salmon, trout, mackerel, shrimp, scallops - Avoid or limit high fat processed meats such as sausage and bacon  Dairy - Include moderate amounts of low fat dairy products  - Focus on healthy dairy such as fat free yogurt, skim milk, low or reduced fat cheese - Limit dairy products higher in fat such as whole or 2% milk, cheese, ice cream  Alcohol - Moderate amounts of red wine is ok  - No more than 5 oz daily for women (all ages) and men older than age 43  - No more than 10 oz of wine daily for men younger than 10  Other - Limit sweets and other  desserts  - Use herbs and spices instead of salt to flavor foods  - Herbs and spices common to the traditional Mediterranean Diet include: basil, bay leaves, chives, cloves, cumin, fennel, garlic, lavender, marjoram, mint, oregano, parsley, pepper, rosemary, sage, savory, sumac, tarragon, thyme   It's not just a diet, it's a lifestyle:  . The Mediterranean diet includes lifestyle factors typical of those in the region  . Foods, drinks and  meals are best eaten with others and savored . Daily physical activity is important for overall good health . This could be strenuous exercise like running and aerobics . This could also be more leisurely activities such as walking, housework, yard-work, or taking the stairs . Moderation is the key; a balanced and healthy diet accommodates most foods and drinks . Consider portion sizes and frequency of consumption of certain foods   Meal Ideas & Options:  . Breakfast:  o Whole wheat toast or whole wheat English muffins with peanut butter & hard boiled egg o Steel cut oats topped with apples & cinnamon and skim milk  o Fresh fruit: banana, strawberries, melon, berries, peaches  o Smoothies: strawberries, bananas, greek yogurt, peanut butter o Low fat greek yogurt with blueberries and granola  o Egg white omelet with spinach and mushrooms o Breakfast couscous: whole wheat couscous, apricots, skim milk, cranberries  . Sandwiches:  o Hummus and grilled vegetables (peppers, zucchini, squash) on whole wheat bread   o Grilled chicken on whole wheat pita with lettuce, tomatoes, cucumbers or tzatziki  o Tuna salad on whole wheat bread: tuna salad made with greek yogurt, olives, red peppers, capers, green onions o Garlic rosemary lamb pita: lamb sauted with garlic, rosemary, salt & pepper; add lettuce, cucumber, greek yogurt to pita - flavor with lemon juice and black pepper  . Seafood:  o Mediterranean grilled salmon, seasoned with garlic, basil, parsley, lemon juice and black pepper o Shrimp, lemon, and spinach whole-grain pasta salad made with low fat greek yogurt  o Seared scallops with lemon orzo  o Seared tuna steaks seasoned salt, pepper, coriander topped with tomato mixture of olives, tomatoes, olive oil, minced garlic, parsley, green onions and cappers  . Meats:  o Herbed greek chicken salad with kalamata olives, cucumber, feta  o Red bell peppers stuffed with spinach, bulgur, lean ground  beef (or lentils) & topped with feta   o Kebabs: skewers of chicken, tomatoes, onions, zucchini, squash  o [place] burgers: made with red onions, mint, dill, lemon juice, feta cheese topped with roasted red peppers . Vegetarian o Cucumber salad: cucumbers, artichoke hearts, celery, red onion, feta cheese, tossed in olive oil & lemon juice  o Hummus and whole grain pita points with a greek salad (lettuce, tomato, feta, olives, cucumbers, red onion) o Lentil soup with celery, carrots made with vegetable broth, garlic, salt and pepper  o Tabouli salad: parsley, bulgur, mint, scallions, cucumbers, tomato, radishes, lemon juice, olive oil, salt and pepper.

## 2016-04-08 ENCOUNTER — Ambulatory Visit (HOSPITAL_COMMUNITY): Payer: Medicare HMO

## 2016-04-11 ENCOUNTER — Ambulatory Visit (HOSPITAL_COMMUNITY): Payer: Medicare HMO | Attending: Internal Medicine | Admitting: Speech Pathology

## 2016-04-11 ENCOUNTER — Ambulatory Visit (HOSPITAL_COMMUNITY): Payer: Medicare HMO | Admitting: Physical Therapy

## 2016-04-11 DIAGNOSIS — R488 Other symbolic dysfunctions: Secondary | ICD-10-CM

## 2016-04-11 DIAGNOSIS — R29898 Other symptoms and signs involving the musculoskeletal system: Secondary | ICD-10-CM | POA: Diagnosis present

## 2016-04-11 DIAGNOSIS — R278 Other lack of coordination: Secondary | ICD-10-CM | POA: Insufficient documentation

## 2016-04-11 DIAGNOSIS — R4701 Aphasia: Secondary | ICD-10-CM

## 2016-04-11 DIAGNOSIS — R480 Dyslexia and alexia: Secondary | ICD-10-CM | POA: Diagnosis present

## 2016-04-11 NOTE — Therapy (Signed)
[facility] [address] [place], [place], [address] Phone: [phone]   Fax:  [phone]  Speech Language Pathology Treatment  Patient Details  Name: Adam [name] MRN: [id number] Date of Birth: Apr 01, 1966 Referring Provider: Alison Murray  Encounter Date: 04/11/2016    Past Medical History  Diagnosis Date  . Mitral regurgitation   . NICM (nonischemic cardiomyopathy) (HCC)     Nonischemic / catheterization October, 2011, normal coronary arteries  . Chronic systolic heart failure (HCC)   . Ejection fraction < 50%     Echo 06/05/12: Mild LVH, EF 20%, posterior severe HK, anterolateral severe HK, anterior severe HK, mid to apical septal AK, AK of the true apex, apical inferior HK, restrictive physiology, trivial MR, moderate LAE, mildly reduced RV function, mild TR.  . ICD (implantable cardiac defibrillator) battery depletion 11/11/10    St. Jude, Dr.Allred  . Renal insufficiency     Creatinine 1.5 (GFR greater than 50)  . Headache(784.0)     Patient seen to have headaches from spironolactone.  Drug was stopped, August, 2012  . Sleepiness     has some sleepiness during the day  . 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 VT (ventricular tachycardia) (HCC)     VT storm 7/13 - Sotalol started  . Drug therapy     Sotalol started for VT storm  July, 2013  . Hypokalemia     There was some decrease in potassium with his admission in July, 2013. We will watch this very carefully.  . Disability examination     Discussion of disability September, 2013    Past Surgical History  Procedure Laterality Date  . Tee with cardioversion  11/17/08    No LV clot  . Cardiac defibrillator placement  11/11/10    SJM Fortify DR ICD implanted by Dr Johney Frame    There were no vitals filed for this visit.          SLP Short Term Goals - 04/11/16 1508    SLP SHORT TERM GOAL #1   Title Pt will increase naming of common objects/pictures to  90% acc when provided with min assist  change to 90% acc with min assist   Baseline 72%   Time 8   Period Weeks   Status On-going   SLP SHORT TERM GOAL #2   Title Pt will complete single word sentence completion tasks [facility]) with 90% acc when provided with mod multimodality cues.   Baseline 70%   Time 8   Period Weeks   Status Achieved   SLP SHORT TERM GOAL #3   Title Pt will increase divergent naming to 5-7 items per concrete category with min cues.  change to 5-7 items with min cues   Baseline 1-2   Time 8   Period Weeks   Status On-going   SLP SHORT TERM GOAL #4   Title Pt will complete basic sentence level reading comprehension tasks with 90% acc with provided mi/mod cues.  change to basic sentence level with multiple choice response with 90% with mi/mod cues   Baseline 25%   Time 8   Period Weeks   Status On-going   SLP SHORT TERM GOAL #5   Title Pt will answer moderate level auditory comprehension yes/no questions with 80% acc when provided mod cues.   Baseline 65%   Time 8   Period Weeks   Status Achieved   SLP SHORT TERM GOAL #6   Title Pt  will be able to write personal/bio information with 90% acc when provided mod cues  Continue goal   Baseline name only   Time 8   Period Weeks   Status On-going   SLP SHORT TERM GOAL #7   Title Pt will generate 5-7 sentence when looking at dynamic picture with 80% acc when provided mod cues.   Baseline 75% acc with mod/max assist   Time 8   Period Weeks   Status On-going   SLP SHORT TERM GOAL #8   Title Pt will use compensatory strategies (gesture, spell, write, describe) with min prompts on 75% of opportunities in conversations   Baseline mod/max prompts to use total communication strategies   Time 8   Period Weeks   Status On-going          SLP Long Term Goals - 04/11/16 1508    SLP LONG TERM GOAL #1   Title Pt will communicate moderate level wants/needs/thoughts to family and friends with use of multimodality  communication strategies as needed.    Baseline mod/severe impairment   Time 3   Period Months   Status On-going     Clinical Impression: Mr. Rindal was alert and cooperative for therapy today. He initiated conversation regarding NBA basketball playoff games over the weekend. Progress toward goals: Pt will increase naming of common objects/pictures to 90% acc when provided with min assist: 88% with min cues. Pt will increase divergent naming to 5-7 items per concrete category with min cues: completed with mod assist from SLP. Pt will complete basic sentence level reading comprehension tasks with 90% acc with provided mi/mod cues: 90% with mod assist from SLP.  Pt will be able to write personal/bio information with 90% acc when provided mod cues: Not addressed this date. Pt will generate 5-7 sentence when looking at dynamic picture with 80% acc when provided mod cues: 92% acc with min cues with use of carrier phrase ("I like to...").  Pt will use compensatory strategies (gesture, spell, write, describe) with min prompts on 75% of opportunities in conversations: Pt cued to visualize and then write letters when filling in letter space cues for single word (pictured object). Pt continues to make excellent progress toward all goals. He became tearful today when discussing communication barrier and social isolation he feels now because of his stroke. He did not discuss going on an antidepressant with the neurologist or with his PCP, but he again states that he is open to the idea if it would help him. I will send another message to his doctors. Continue plan of care.     Patient will benefit from skilled therapeutic intervention in order to improve the following deficits and impairments:   Aphasia  Dyslexia and alexia  Agraphia    Problem List Patient Active Problem List   Diagnosis Date Noted  . Lightheadedness 01/24/2014  . Nonischemic cardiomyopathy (HCC) 10/29/2013  . Chronic systolic CHF  (congestive heart failure) (HCC) 04/26/2013  . Chest discomfort 04/26/2013  . Bradycardia 04/26/2013  . Automatic implantable cardioverter-defibrillator in situ 09/04/2012  . Hypokalemia   . Disability examination   . Drug therapy   . Ventricular tachyarrhythmia (HCC) 06/06/2012  . Headache(784.0)   . Sleepiness   . Mitral regurgitation   . Cardiomyopathy (HCC)   . Ejection fraction < 50%   . Renal insufficiency    Thank you,  Havery Moros, CCC-SLP [phone]  PORTER,DABNEY 04/13/2016, 11:09 AM  [facility] [address]  [place], [place], [address] Phone: [phone]   Fax:  [phone]   Name: Nesbit Michon Tisdel MRN: [id number] Date of Birth: 1966/10/13

## 2016-04-13 ENCOUNTER — Ambulatory Visit (HOSPITAL_COMMUNITY): Payer: Medicare HMO | Admitting: Occupational Therapy

## 2016-04-13 ENCOUNTER — Ambulatory Visit (HOSPITAL_COMMUNITY): Payer: Medicare HMO | Admitting: Speech Pathology

## 2016-04-13 ENCOUNTER — Ambulatory Visit (HOSPITAL_COMMUNITY): Payer: Medicare HMO

## 2016-04-13 ENCOUNTER — Encounter (HOSPITAL_COMMUNITY): Payer: Self-pay | Admitting: Occupational Therapy

## 2016-04-13 DIAGNOSIS — R480 Dyslexia and alexia: Secondary | ICD-10-CM

## 2016-04-13 DIAGNOSIS — R4701 Aphasia: Secondary | ICD-10-CM

## 2016-04-13 DIAGNOSIS — R488 Other symbolic dysfunctions: Secondary | ICD-10-CM

## 2016-04-13 DIAGNOSIS — R278 Other lack of coordination: Secondary | ICD-10-CM

## 2016-04-13 DIAGNOSIS — R29898 Other symptoms and signs involving the musculoskeletal system: Secondary | ICD-10-CM

## 2016-04-13 NOTE — Therapy (Signed)
[facility] [address] [place], [place], [address] Phone: [phone]   Fax:  [phone]  Speech Language Pathology Treatment  Patient Details  Name: Adam [name] MRN: [id number] Date of Birth: [birth year] Referring Provider: Alison Murray  Encounter Date: 04/13/2016      End of Session - 04/13/16 1314    Visit Number 18   Number of Visits 24   Date for SLP Re-Evaluation 04/04/16   Authorization Type [employer]   Authorization Time Period 04/04/2016- 06/04/2016   Authorization - Visit Number 4   SLP Start Time 1300   SLP Stop Time  1345   SLP Time Calculation (min) 45 min   Activity Tolerance Patient tolerated treatment well      Past Medical History  Diagnosis Date  . Mitral regurgitation   . NICM (nonischemic cardiomyopathy) (HCC)     Nonischemic / catheterization October, 2011, normal coronary arteries  . Chronic systolic heart failure (HCC)   . Ejection fraction < 50%     Echo 06/05/12: Mild LVH, EF 20%, posterior severe HK, anterolateral severe HK, anterior severe HK, mid to apical septal AK, AK of the true apex, apical inferior HK, restrictive physiology, trivial MR, moderate LAE, mildly reduced RV function, mild TR.  . ICD (implantable cardiac defibrillator) battery depletion 11/11/10    St. Jude, Dr.Allred  . Renal insufficiency     Creatinine 1.5 (GFR greater than 50)  . Headache(784.0)     Patient seen to have headaches from spironolactone.  Drug was stopped, August, 2012  . Sleepiness     has some sleepiness during the day  . 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 VT (ventricular tachycardia) (HCC)     VT storm 7/13 - Sotalol started  . Drug therapy     Sotalol started for VT storm  July, 2013  . Hypokalemia     There was some decrease in potassium with his admission in July, 2013. We will watch this very carefully.  . Disability examination     Discussion of disability September, 2013     Past Surgical History  Procedure Laterality Date  . Tee with cardioversion  11/17/08    No LV clot  . Cardiac defibrillator placement  11/11/10    SJM Fortify DR ICD implanted by Dr Johney Frame    There were no vitals filed for this visit.      Subjective Assessment - 04/13/16 1306    Subjective "I think Adam [name] is going to win tonight."   Currently in Pain? No/denies               ADULT SLP TREATMENT - 04/13/16 1307    General Information   Behavior/Cognition Alert;Cooperative;Pleasant mood   Patient Positioning Upright in chair   Oral care provided N/A   HPI Adam [name] is a 50 yo male from [place], [place] with a past medical history of atrial fibrillation, CHF, s/p cardiac pacemaker implantation, and gout who developed right hemiplegia and global aphasia on 01/09/2016. He was taken to [facility] where his studies showed left MCA stroke. He received IV TPA at [facility] and was airlifted to [facility] as stroke code. 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 He developed SOB and was started on BIPAP during that hospitalization. He was transferred to an inpatient rehabilitation facility on 01/15/16, but showed seizure activity on 01/19/16 and was  re-hospitalized. He was discharged home from that hospitalization on 01/20/2016. He has not received speech therapy since 01/19/16 unfortunately. He was referred by Dr. Alison Murray for outpatient SLP services due to aphasia from recent stroke. He was accompanied to the appointment by [name], his wife   Treatment Provided   Treatment provided Cognitive-Linquistic   Pain Assessment   Pain Assessment No/denies pain   Cognitive-Linquistic Treatment   Treatment focused on Aphasia;Patient/family/caregiver education   Skilled Treatment pt education, compensatory strategies, word finding activities, automatic speech drills   Assessment / Recommendations / Plan    Plan Continue with current plan of care   Progression Toward Goals   Progression toward goals Progressing toward goals            SLP Short Term Goals - 04/13/16 1314    SLP SHORT TERM GOAL #1   Title Pt will increase naming of common objects/pictures to 90% acc when provided with min assist  change to 90% acc with min assist   Baseline 72%   Time 8   Period Weeks   Status On-going   SLP SHORT TERM GOAL #2   Title Pt will complete single word sentence completion tasks (SWSC) with 90% acc when provided with mod multimodality cues.   Baseline 70%   Time 8   Period Weeks   Status Achieved   SLP SHORT TERM GOAL #3   Title Pt will increase divergent naming to 5-7 items per concrete category with min cues.  change to 5-7 items with min cues   Baseline 1-2   Time 8   Period Weeks   Status On-going   SLP SHORT TERM GOAL #4   Title Pt will complete basic sentence level reading comprehension tasks with 90% acc with provided mi/mod cues.  change to basic sentence level with multiple choice response with 90% with mi/mod cues   Baseline 25%   Time 8   Period Weeks   Status On-going   SLP SHORT TERM GOAL #5   Title Pt will answer moderate level auditory comprehension yes/no questions with 80% acc when provided mod cues.   Baseline 65%   Time 8   Period Weeks   Status Achieved   SLP SHORT TERM GOAL #6   Title Pt will be able to write personal/bio information with 90% acc when provided mod cues  Continue goal   Baseline name only   Time 8   Period Weeks   Status On-going   SLP SHORT TERM GOAL #7   Title Pt will generate 5-7 sentence when looking at dynamic picture with 80% acc when provided mod cues.   Baseline 75% acc with mod/max assist   Time 8   Period Weeks   Status On-going   SLP SHORT TERM GOAL #8   Title Pt will use compensatory strategies (gesture, spell, write, describe) with min prompts on 75% of opportunities in conversations   Baseline mod/max prompts to use  total communication strategies   Time 8   Period Weeks   Status On-going          SLP Long Term Goals - 04/13/16 1317    SLP LONG TERM GOAL #1   Title Pt will communicate moderate level wants/needs/thoughts to family and friends with use of multimodality communication strategies as needed.    Baseline mod/severe impairment   Time 3   Period Months   Status On-going          Plan - 04/13/16 1314  Clinical Impression Statement Mr. Moscatelli was alert and cooperative for therapy today. He initiated conversation regarding the [employer]. He did not complete homework as he stated he was doing other exercises. Progress toward goals: Pt will increase naming of common objects/pictures to 90% acc when provided with min assist: 95% Pt will increase divergent naming to 5-7 items per concrete category with min cues: Not addressed this date. Pt will complete basic sentence level reading comprehension tasks with 90% acc with provided mi/mod cues: 85% acc with mi/mod assist.  Pt will be able to write personal/bio information with 90% acc when provided mod cues: Not addressed. Pt will generate 5-7 sentence when looking at dynamic picture with 80% acc when provided mod cues: Not addressed this date. Pt will use compensatory strategies (gesture, spell, write, describe) with min prompts on 75% of opportunities in conversations: 90% acc with mod cues. Also targeted convergent naming by providing three words and pt stating what they had in common and/or adding to the list. He completed with 78% when provided mod/max cues. SLP demonstrated use of gestures to help convey meaning and pt able to return demonstrate. Continue plan of care.     Speech Therapy Frequency 3x / week   Duration --  8 weeks   Treatment/Interventions Language facilitation;Compensatory techniques;SLP instruction and feedback;Cueing hierarchy;Patient/family education;Compensatory strategies;Multimodal communcation approach;Functional  tasks   Potential to Achieve Goals Good   Potential Considerations Severity of impairments   SLP Home Exercise Plan Pt will be independent with HEP to facilitate carryover of treatment strategies and techniques in home/community environment with assist from wife as needed.    Consulted and Agree with Plan of Care Patient;Family member/caregiver   Family Member Consulted Wife, [name]      Patient will benefit from skilled therapeutic intervention in order to improve the following deficits and impairments:   Aphasia  Dyslexia and alexia  Agraphia    Problem List Patient Active Problem List   Diagnosis Date Noted  . Lightheadedness 01/24/2014  . Nonischemic cardiomyopathy (HCC) 10/29/2013  . Chronic systolic CHF (congestive heart failure) (HCC) 04/26/2013  . Chest discomfort 04/26/2013  . Bradycardia 04/26/2013  . Automatic implantable cardioverter-defibrillator in situ 09/04/2012  . Hypokalemia   . Disability examination   . Drug therapy   . Ventricular tachyarrhythmia (HCC) 06/06/2012  . Headache(784.0)   . Sleepiness   . Mitral regurgitation   . Cardiomyopathy (HCC)   . Ejection fraction < 50%   . Renal insufficiency    Thank you,  Havery Moros, CCC-SLP [phone]  [facility] 04/13/2016, 1:19 PM  [facility] [address] [place], [place], [address] Phone: [phone]   Fax:  [phone]   Name: Burle Kivett Boettner MRN: [id number] Date of Birth: 08-22-1966

## 2016-04-13 NOTE — Therapy (Signed)
[facility] [address] [place], [place], [address] Phone: [phone]   Fax:  [phone]  Occupational Therapy Treatment  Patient Details  Name: Adam [name] MRN: [id number] Date of Birth: 03-04-1966 Referring Provider: Dr. Kirstie Peri  Encounter Date: 04/13/2016      OT End of Session - 04/13/16 1434    Visit Number 6   Number of Visits 8   Date for OT Re-Evaluation 04/09/16   Authorization Type [employer]   Authorization Time Period Before 10th visit   Authorization - Visit Number 6   Authorization - Number of Visits 10   OT Start Time 1347   OT Stop Time 1429   OT Time Calculation (min) 42 min   Activity Tolerance Patient tolerated treatment well   Behavior During Therapy [facility] for tasks assessed/performed      Past Medical History  Diagnosis Date  . Mitral regurgitation   . NICM (nonischemic cardiomyopathy) (HCC)     Nonischemic / catheterization October, 2011, normal coronary arteries  . Chronic systolic heart failure (HCC)   . Ejection fraction < 50%     Echo 06/05/12: Mild LVH, EF 20%, posterior severe HK, anterolateral severe HK, anterior severe HK, mid to apical septal AK, AK of the true apex, apical inferior HK, restrictive physiology, trivial MR, moderate LAE, mildly reduced RV function, mild TR.  . ICD (implantable cardiac defibrillator) battery depletion 11/11/10    St. Jude, Dr.Allred  . Renal insufficiency     Creatinine 1.5 (GFR greater than 50)  . Headache(784.0)     Patient seen to have headaches from spironolactone.  Drug was stopped, August, 2012  . Sleepiness     has some sleepiness during the day  . 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 VT (ventricular tachycardia) (HCC)     VT storm 7/13 - Sotalol started  . Drug therapy     Sotalol started for VT storm  July, 2013  . Hypokalemia     There was some decrease in potassium with his admission in July, 2013. We will watch this very  carefully.  . Disability examination     Discussion of disability September, 2013    Past Surgical History  Procedure Laterality Date  . Tee with cardioversion  11/17/08    No LV clot  . Cardiac defibrillator placement  11/11/10    SJM Fortify DR ICD implanted by Dr Johney Frame    There were no vitals filed for this visit.      Subjective Assessment - 04/13/16 1350    Subjective  S: I've been working out a little bit lately at home.    Currently in Pain? 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 Pt with max difficulty with comprehension and critical thinking components. Completed 3 questions with increased time and max assistance from OTR.    Exercises   Exercises Hand   Hand Exercises   Hand Gripper with Large Beads 6/6 gripper at 45#   Hand Gripper with Medium  Beads 13/13 gripper set at 42#  1 rest break   Other Hand Exercises Pt used blue high resistance clothespin in a three point pinch to grasp high resistance sponges and place in bucket. Pt complete 25 times, mod difficulty. 1 rest break   Fine Motor Coordination   Handwriting Pt completed copying task with boldly lined paper this session. Pt was asked to focus on copying one sentence, with emphasis on appropriate spacing and maintaining similar letter/word size. Pt with improvment in letter formation and line spacing this session compared to first trial on 04/01/16.                   OT Short Term Goals - 04/06/16 1406    OT SHORT TERM GOAL #1   Title Pt will be provided with and educated on HEP.    Time 4   Period Weeks   Status On-going   OT SHORT  TERM GOAL #2   Title Pt will return to prior level of functioning and independence during daily and leisure tasks.    Time 4   Period Weeks   Status On-going   OT SHORT TERM GOAL #3   Title Pt will increase right grip strength by 15# to increase ability to carry weighted objects such as a gallon of milk.    Time 4   Period Weeks   Status On-going   OT SHORT TERM GOAL #4   Title Pt will increase right pinch strength by 6# to increase ability to hold onto and use utensils with right hand as dominant.    Time 4   Period Weeks   Status On-going   OT SHORT TERM GOAL #5   Title Pt will increase fine motor coordination to improve ability to complete daily tasks such as tying shoes and completing handwriting tasks.    Time 4   Period Weeks   Status On-going                  Plan - 04/13/16 1434    Clinical Impression Statement P: Continued "Dictionary Skills" cogntive worksheet this session, three questions completed with max difficulty. Continue grip and pinch strengthening, improvements noted in pinch strengthening activity this session. Handwriting activity completed, improvements noted in letter formation and spacing.    Rehab Potential Good   OT Frequency 2x / week   OT Duration 4 weeks   OT Treatment/Interventions Self-care/ADL training;DME and/or AE instruction;Patient/family education;Cryotherapy;Electrical Stimulation;Moist Heat;Therapeutic exercise;Manual Therapy;Therapeutic activities   Plan P: Continue with fine motor coordination activities-attempt grooved pegboard.    Consulted and Agree with Plan of Care Patient      Patient will benefit from skilled therapeutic intervention in order to improve the following deficits and impairments:  Decreased strength, Impaired UE functional use  Visit Diagnosis: Other symptoms and signs involving the musculoskeletal system  Other lack of coordination    Problem List Patient Active Problem List   Diagnosis Date Noted   . Lightheadedness 01/24/2014  . Nonischemic cardiomyopathy (HCC) 10/29/2013  . Chronic systolic CHF (congestive heart failure) (HCC) 04/26/2013  . Chest discomfort 04/26/2013  . Bradycardia 04/26/2013  . Automatic implantable cardioverter-defibrillator in situ 09/04/2012  . Hypokalemia   . Disability examination   . Drug therapy   . Ventricular tachyarrhythmia (HCC) 06/06/2012  . Headache(784.0)   . Sleepiness   . Mitral regurgitation   . Cardiomyopathy (HCC)   . Ejection fraction < 50%   . Renal insufficiency     Ezra Sites,  OTR/L  [phone]  04/13/2016, 2:37 PM  [facility] [address] [place], [place], [address] Phone: [phone]   Fax:  [phone]  Name: Adam [name] MRN: [id number] Date of Birth: 1966-06-03

## 2016-04-15 ENCOUNTER — Ambulatory Visit (HOSPITAL_COMMUNITY): Payer: Medicare HMO | Admitting: Physical Therapy

## 2016-04-18 ENCOUNTER — Encounter (HOSPITAL_COMMUNITY): Payer: Self-pay

## 2016-04-18 ENCOUNTER — Ambulatory Visit (HOSPITAL_COMMUNITY): Payer: Medicare HMO | Admitting: Speech Pathology

## 2016-04-18 ENCOUNTER — Ambulatory Visit (HOSPITAL_COMMUNITY): Payer: Medicare HMO | Admitting: Physical Therapy

## 2016-04-18 ENCOUNTER — Ambulatory Visit (HOSPITAL_COMMUNITY): Payer: Medicare HMO

## 2016-04-18 DIAGNOSIS — R278 Other lack of coordination: Secondary | ICD-10-CM

## 2016-04-18 DIAGNOSIS — R480 Dyslexia and alexia: Secondary | ICD-10-CM

## 2016-04-18 DIAGNOSIS — R4701 Aphasia: Secondary | ICD-10-CM

## 2016-04-18 DIAGNOSIS — R29898 Other symptoms and signs involving the musculoskeletal system: Secondary | ICD-10-CM

## 2016-04-18 DIAGNOSIS — R488 Other symbolic dysfunctions: Secondary | ICD-10-CM

## 2016-04-18 NOTE — Therapy (Signed)
[facility], [place], [address] Phone: [phone]   Fax:  [phone]  Occupational Therapy Treatment And reassessment Patient Details  Name: Adam [name] MRN: [id number] Date of Birth: [birth year] Referring Provider: Dr. Monico Blitz  Encounter Date: 04/18/2016      OT End of Session - 04/18/16 1409    Visit Number 7   Number of Visits [place] Medicare (medicaid approved 8 visits. (04/12/16-05/09/16)   Authorization Time Period Before 10th visit   Authorization - Visit Number 7   Authorization - Number of Visits 10   OT Start Time 8786   OT Stop Time 1430   OT Time Calculation (min) 41 min   Activity Tolerance Patient tolerated treatment well   Behavior During Therapy [facility] for tasks assessed/performed      Past Medical History  Diagnosis Date  . Mitral regurgitation   . NICM (nonischemic cardiomyopathy) ([place])     Nonischemic / catheterization October, 2011, normal coronary arteries  . Chronic systolic heart failure ([place])   . Ejection fraction < 50%     Echo 06/05/12: Mild LVH, EF 20%, posterior severe HK, anterolateral severe HK, anterior severe HK, mid to apical septal AK, AK of the true apex, apical inferior HK, restrictive physiology, trivial MR, moderate LAE, mildly reduced RV function, mild TR.  . ICD (implantable cardiac defibrillator) battery depletion 11/11/10    St. Jude, Dr.Allred  . Renal insufficiency     Creatinine 1.5 (GFR greater than 50)  . Headache(784.0)     Patient seen to have headaches from spironolactone.  Drug was stopped, August, 2012  . Sleepiness     has some sleepiness during the day  . 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 VT (ventricular tachycardia) ([place])     VT storm 7/13 - Sotalol started  . Drug therapy     Sotalol started for VT storm  July, 2013  . Hypokalemia     There was some decrease in potassium with his admission in July, 2013. We  will watch this very carefully.  . Disability examination     Discussion of disability September, 2013    Past Surgical History  Procedure Laterality Date  . Tee with cardioversion  11/17/08    No LV clot  . Cardiac defibrillator placement  11/11/10    SJM Fortify DR ICD implanted by Dr Rayann Heman    There were no vitals filed for this visit.      Subjective Assessment - 04/18/16 1403    Subjective  S: I've noticed that my handwriting has gotten better.    Currently in Pain? No/denies            [facility] OT Assessment - 04/18/16 1350    Assessment   Diagnosis s/p left MCA CVA with right hemiplegia   Precautions   Precautions None   Vision - History   Baseline Vision No visual deficits   Patient Visual Report Other (comment)  Pt reports that vision is no longer blurry.   Coordination   9 Hole Peg Test Right   Right 9 Hole Peg Test 25.8"  previous; 36.88"   Strength   Right/Left hand Right   Right Hand Grip (lbs) 120  previous: 76   Right Hand Lateral Pinch 38 lbs  previous: 20   Right Hand 3 Point Pinch 24 lbs  previous: 14  OT Treatments/Exercises (OP) - 05/13/2016 1405    Exercises   Exercises Hand   Fine Motor Coordination   Fine Motor Coordination Dealing card with thumb;Flipping cards   Flipping cards Using right hand focusing on speed. Min difficulty   Dealing card with thumb Using right hand. Focusing on coordination. Min difficulty.   Grooved pegs Pt completed grooved pegboard using tweezers to place pegs in holes. Mod-max difficulty with tweezer use.                   OT Short Term Goals - 2016/05/13 1355    OT SHORT TERM GOAL #1   Title Pt will be provided with and educated on HEP.    Time 4   Period Weeks   Status Achieved   OT SHORT TERM GOAL #2   Title Pt will return to prior level of functioning and independence during daily and leisure tasks.    Time 4   Period Weeks   Status Achieved   OT SHORT TERM GOAL #3    Title Pt will increase right grip strength by 15# to increase ability to carry weighted objects such as a gallon of milk.    Time 4   Period Weeks   Status Achieved   OT SHORT TERM GOAL #4   Title Pt will increase right pinch strength by 6# to increase ability to hold onto and use utensils with right hand as dominant.    Time 4   Period Weeks   Status Achieved   OT SHORT TERM GOAL #5   Title Pt will increase fine motor coordination to improve ability to complete daily tasks such as tying shoes and completing handwriting tasks.    Time 4   Period Weeks   Status Achieved                  Plan - 05/13/2016 1410    Clinical Impression Statement P: Recertifcation/reassessment completed today. Patient has met all therapy goals. Patient reports that he is writing better and able to tie his shoes without difficulty. Patient has greatly increased his grip and pinch strength. patient was encouraged to continue practicing his handwriting skills at home. No need to continue with theraputty at this time. Pt agrees with OT recommendation to discharge.    Plan P: D/C from OT services.       Patient will benefit from skilled therapeutic intervention in order to improve the following deficits and impairments:  Decreased strength, Impaired UE functional use  Visit Diagnosis: Other symptoms and signs involving the musculoskeletal system  Other lack of coordination      G-Codes - 2016/05/13 1413    Functional Assessment Tool Used clinical judgement   Functional Limitation Carrying, moving and handling objects   Carrying, Moving and Handling Objects Goal Status (H6314) At least 1 percent but less than 20 percent impaired, limited or restricted   Carrying, Moving and Handling Objects Discharge Status [phone]) 0 percent impaired, limited or restricted      Problem List Patient Active Problem List   Diagnosis Date Noted  . Lightheadedness 01/24/2014  . Nonischemic cardiomyopathy ([place])  10/29/2013  . Chronic systolic CHF (congestive heart failure) ([place]) 04/26/2013  . Chest discomfort 04/26/2013  . Bradycardia 04/26/2013  . Automatic implantable cardioverter-defibrillator in situ 09/04/2012  . Hypokalemia   . Disability examination   . Drug therapy   . Ventricular tachyarrhythmia ([place]) 06/06/2012  . Headache(784.0)   . Sleepiness   . Mitral regurgitation   .  Cardiomyopathy ([place])   . Ejection fraction < 50%   . Renal insufficiency   OCCUPATIONAL THERAPY DISCHARGE SUMMARY  Visits from Start of Care: 7  Current functional level related to goals / functional outcomes: See above   Remaining deficits: None   Education / Equipment: See above Plan: Patient agrees to discharge.  Patient goals were met. Patient is being discharged due to meeting the stated rehab goals.  ?????       Ailene Ravel, OTR/L,CBIS  [phone]  04/18/2016, 2:36 PM  [facility] [address] [place], [place], [address] Phone: [phone]   Fax:  [phone]  Name: Adam [name] MRN: [id number] Date of Birth: 04/18/1966

## 2016-04-18 NOTE — Therapy (Signed)
[facility] [address] [place], [place], [address] Phone: [phone]   Fax:  [phone]  Speech Language Pathology Treatment  Patient Details  Name: Adam [name] MRN: [id number] Date of Birth: [birth year] Referring Provider: Alison Murray  Encounter Date: 04/18/2016      End of Session - 04/18/16 1627    Visit Number 19   Number of Visits 24   Date for SLP Re-Evaluation 04/04/16   Authorization Type [employer]   Authorization Time Period 04/04/2016- 06/04/2016   Authorization - Visit Number 4   SLP Start Time 1430   SLP Stop Time  1515   SLP Time Calculation (min) 45 min   Activity Tolerance Patient tolerated treatment well      Past Medical History  Diagnosis Date  . Mitral regurgitation   . NICM (nonischemic cardiomyopathy) (HCC)     Nonischemic / catheterization October, 2011, normal coronary arteries  . Chronic systolic heart failure (HCC)   . Ejection fraction < 50%     Echo 06/05/12: Mild LVH, EF 20%, posterior severe HK, anterolateral severe HK, anterior severe HK, mid to apical septal AK, AK of the true apex, apical inferior HK, restrictive physiology, trivial MR, moderate LAE, mildly reduced RV function, mild TR.  . ICD (implantable cardiac defibrillator) battery depletion 11/11/10    [employer] Jude, Dr.Allred  . Renal insufficiency     Creatinine 1.5 (GFR greater than 50)  . Headache(784.0)     Patient seen to have headaches from spironolactone.  Drug was stopped, August, 2012  . Sleepiness     has some sleepiness during the day  . 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 VT (ventricular tachycardia) (HCC)     VT storm 7/13 - Sotalol started  . Drug therapy     Sotalol started for VT storm  July, 2013  . Hypokalemia     There was some decrease in potassium with his admission in July, 2013. We will watch this very carefully.  . Disability examination     Discussion of disability September, 2013     Past Surgical History  Procedure Laterality Date  . Tee with cardioversion  11/17/08    No LV clot  . Cardiac defibrillator placement  11/11/10    SJM Fortify DR ICD implanted by Dr Johney Frame    There were no vitals filed for this visit.      Subjective Assessment - 04/18/16 1621    Subjective "I went to a birthday party this weekend."   Currently in Pain? No/denies               ADULT SLP TREATMENT - 04/18/16 1623    General Information   Behavior/Cognition Alert;Cooperative;Pleasant mood   Patient Positioning Upright in chair   Oral care provided N/A   HPI [name] is a 50 yo male from [place], [place] with a past medical history of atrial fibrillation, CHF, s/p cardiac pacemaker implantation, and gout who developed right hemiplegia and global aphasia on 01/09/2016. He was taken to [facility] where his studies showed left MCA stroke. He received IV TPA at [facility] and was airlifted to [facility] as stroke code. 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 He developed SOB and was started on BIPAP during that hospitalization. He was transferred to an inpatient rehabilitation facility on 01/15/16, but showed seizure activity on 01/19/16 and was re-hospitalized.  He was discharged home from that hospitalization on 01/20/2016. He has not received speech therapy since 01/19/16 unfortunately. He was referred by Dr. Alison Murray for outpatient SLP services due to aphasia from recent stroke. He was accompanied to the appointment by Adam [name], his wife   Treatment Provided   Treatment provided Cognitive-Linquistic   Pain Assessment   Pain Assessment No/denies pain   Cognitive-Linquistic Treatment   Treatment focused on Aphasia;Patient/family/caregiver education   Skilled Treatment pt education, compensatory strategies, word finding activities, automatic speech drills   Assessment / Recommendations / Plan   Plan  Continue with current plan of care   Progression Toward Goals   Progression toward goals Progressing toward goals            SLP Short Term Goals - 04/18/16 1631    SLP SHORT TERM GOAL #1   Title Pt will increase naming of common objects/pictures to 90% acc when provided with min assist  change to 90% acc with min assist   Baseline 72%   Time 8   Period Weeks   Status On-going   SLP SHORT TERM GOAL #2   Title Pt will complete single word sentence completion tasks (SWSC) with 90% acc when provided with mod multimodality cues.   Baseline 70%   Time 8   Period Weeks   Status Achieved   SLP SHORT TERM GOAL #3   Title Pt will increase divergent naming to 5-7 items per concrete category with min cues.  change to 5-7 items with min cues   Baseline 1-2   Time 8   Period Weeks   Status On-going   SLP SHORT TERM GOAL #4   Title Pt will complete basic sentence level reading comprehension tasks with 90% acc with provided mi/mod cues.  change to basic sentence level with multiple choice response with 90% with mi/mod cues   Baseline 25%   Time 8   Period Weeks   Status On-going   SLP SHORT TERM GOAL #5   Title Pt will answer moderate level auditory comprehension yes/no questions with 80% acc when provided mod cues.   Baseline 65%   Time 8   Period Weeks   Status Achieved   SLP SHORT TERM GOAL #6   Title Pt will be able to write personal/bio information with 90% acc when provided mod cues  Continue goal   Baseline name only   Time 8   Period Weeks   Status On-going   SLP SHORT TERM GOAL #7   Title Pt will generate 5-7 sentence when looking at dynamic picture with 80% acc when provided mod cues.   Baseline 75% acc with mod/max assist   Time 8   Period Weeks   Status On-going   SLP SHORT TERM GOAL #8   Title Pt will use compensatory strategies (gesture, spell, write, describe) with min prompts on 75% of opportunities in conversations   Baseline mod/max prompts to use total  communication strategies   Time 8   Period Weeks   Status On-going          SLP Long Term Goals - 04/18/16 1632    SLP LONG TERM GOAL #1   Title Pt will communicate moderate level wants/needs/thoughts to family and friends with use of multimodality communication strategies as needed.    Baseline mod/severe impairment   Time 3   Period Months   Status On-going          Plan - 04/18/16 1631  Clinical Impression Statement [name] was alert and cooperative for therapy today. He initiated conversation regarding activities over the weekend. He went to a birthday party and reports that he had a good time and was able to converse with friends. He did not complete homework as he stated he was "busy" doing other things. It is possible that homework on his own is challenging. Progress toward goals: Pt will increase naming of common objects/pictures to 90% acc when provided with min assist: 95% Pt will increase divergent naming to 5-7 items per concrete category with min cues: Not addressed this date. Pt will complete basic sentence level reading comprehension tasks with 90% acc with provided mi/mod cues:75% acc with mi/mod assist.  Pt will be able to write personal/bio information with 90% acc when provided mod cues: Not addressed. Pt will generate 5-7 sentence when looking at dynamic picture with 80% acc when provided mod cues: Not addressed this date. Pt will use compensatory strategies (gesture, spell, write, describe) with min prompts on 75% of opportunities in conversations: 90% acc with mi/mod cues. Also targeted responsive naming by providing names of [employer] basketball players and/or where they played when he was given a nickname. He completed with 85% when provided mod cues. G-codes next session. Pt graduated from OT. Continue plan of care.     Speech Therapy Frequency 2x / week   Duration --  8 weeks   Treatment/Interventions Language facilitation;Compensatory techniques;SLP instruction and  feedback;Cueing hierarchy;Patient/family education;Compensatory strategies;Multimodal communcation approach;Functional tasks   Potential to Achieve Goals Good   Potential Considerations Severity of impairments   SLP Home Exercise Plan Pt will be independent with HEP to facilitate carryover of treatment strategies and techniques in home/community environment with assist from wife as needed.    Consulted and Agree with Plan of Care Patient;Family member/caregiver   Family Member Consulted Wife, Adam [name]      Patient will benefit from skilled therapeutic intervention in order to improve the following deficits and impairments:   Aphasia  Dyslexia and alexia  Agraphia    Problem List Patient Active Problem List   Diagnosis Date Noted  . Lightheadedness 01/24/2014  . Nonischemic cardiomyopathy (HCC) 10/29/2013  . Chronic systolic CHF (congestive heart failure) (HCC) 04/26/2013  . Chest discomfort 04/26/2013  . Bradycardia 04/26/2013  . Automatic implantable cardioverter-defibrillator in situ 09/04/2012  . Hypokalemia   . Disability examination   . Drug therapy   . Ventricular tachyarrhythmia (HCC) 06/06/2012  . Headache(784.0)   . Sleepiness   . Mitral regurgitation   . Cardiomyopathy (HCC)   . Ejection fraction < 50%   . Renal insufficiency    Thank you,  Havery Moros, CCC-SLP [phone]  [facility] 04/18/2016, 4:33 PM  [facility] [address] [place], [place], [address] Phone: [phone]   Fax:  [phone]   Name: Adam [name] MRN: [id number] Date of Birth: 1966/01/17

## 2016-04-20 ENCOUNTER — Ambulatory Visit (HOSPITAL_COMMUNITY): Payer: Medicare HMO

## 2016-04-20 ENCOUNTER — Ambulatory Visit (HOSPITAL_COMMUNITY): Payer: Medicare HMO | Admitting: Speech Pathology

## 2016-04-20 ENCOUNTER — Encounter (HOSPITAL_COMMUNITY): Payer: Medicare HMO

## 2016-04-20 DIAGNOSIS — R4701 Aphasia: Secondary | ICD-10-CM | POA: Diagnosis not present

## 2016-04-20 DIAGNOSIS — R480 Dyslexia and alexia: Secondary | ICD-10-CM

## 2016-04-20 DIAGNOSIS — R488 Other symbolic dysfunctions: Secondary | ICD-10-CM

## 2016-04-20 NOTE — Therapy (Signed)
[facility] [address] [place], [place], [address] Phone: [phone]   Fax:  [phone]  Speech Language Pathology Treatment  Patient Details  Name: Adam [name] MRN: [id number] Date of Birth: 1966-03-28 Referring Provider: Alison Murray  Encounter Date: 04/20/2016      End of Session - 04/20/16 1730    Visit Number 20   Number of Visits 24   Date for SLP Re-Evaluation 04/04/16   Authorization Type [employer]   Authorization Time Period 04/04/2016- 06/04/2016   Authorization - Visit Number 5   SLP Start Time 1300   SLP Stop Time  1345   SLP Time Calculation (min) 45 min   Activity Tolerance Patient tolerated treatment well      Past Medical History  Diagnosis Date  . Mitral regurgitation   . NICM (nonischemic cardiomyopathy) (HCC)     Nonischemic / catheterization October, 2011, normal coronary arteries  . Chronic systolic heart failure (HCC)   . Ejection fraction < 50%     Echo 06/05/12: Mild LVH, EF 20%, posterior severe HK, anterolateral severe HK, anterior severe HK, mid to apical septal AK, AK of the true apex, apical inferior HK, restrictive physiology, trivial MR, moderate LAE, mildly reduced RV function, mild TR.  . ICD (implantable cardiac defibrillator) battery depletion 11/11/10    [employer]. Jude, Dr.Allred  . Renal insufficiency     Creatinine 1.5 (GFR greater than 50)  . Headache(784.0)     Patient seen to have headaches from spironolactone.  Drug was stopped, August, 2012  . Sleepiness     has some sleepiness during the day  . 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 VT (ventricular tachycardia) (HCC)     VT storm 7/13 - Sotalol started  . Drug therapy     Sotalol started for VT storm  July, 2013  . Hypokalemia     There was some decrease in potassium with his admission in July, 2013. We will watch this very carefully.  . Disability examination     Discussion of disability September, 2013     Past Surgical History  Procedure Laterality Date  . Tee with cardioversion  11/17/08    No LV clot  . Cardiac defibrillator placement  11/11/10    SJM Fortify DR ICD implanted by Dr Johney Frame    There were no vitals filed for this visit.      Subjective Assessment - 04/20/16 2229    Subjective "Good"   Currently in Pain? No/denies               ADULT SLP TREATMENT - 04/20/16 0001    General Information   Behavior/Cognition Alert;Cooperative;Pleasant mood   Patient Positioning Upright in chair   Oral care provided N/A   HPI [name] is a 50 yo male from [place], [place] with a past medical history of atrial fibrillation, CHF, s/p cardiac pacemaker implantation, and gout who developed right hemiplegia and global aphasia on 01/09/2016. He was taken to [facility] where his studies showed left MCA stroke. He received IV TPA at [facility] and was airlifted to [facility] as stroke code. 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 He developed SOB and was started on BIPAP during that hospitalization. He was transferred to an inpatient rehabilitation facility on 01/15/16, but showed seizure activity on 01/19/16 and was re-hospitalized. He was discharged home from that hospitalization  on 01/20/2016. He has not received speech therapy since 01/19/16 unfortunately. He was referred by Dr. Alison Murray for outpatient SLP services due to aphasia from recent stroke. He was accompanied to the appointment by Adam [name], his wife   Treatment Provided   Treatment provided Cognitive-Linquistic   Pain Assessment   Pain Assessment No/denies pain   Cognitive-Linquistic Treatment   Treatment focused on Aphasia;Patient/family/caregiver education   Assessment / Recommendations / Plan   Plan Continue with current plan of care   Progression Toward Goals   Progression toward goals Progressing toward goals            SLP Short  Term Goals - 04/20/16 2231    SLP SHORT TERM GOAL #1   Title Pt will increase naming of common objects/pictures to 90% acc when provided with min assist  change to 90% acc with min assist   Baseline 72%   Time 8   Period Weeks   Status On-going   SLP SHORT TERM GOAL #2   Title Pt will complete single word sentence completion tasks [facility]) with 90% acc when provided with mod multimodality cues.   Baseline 70%   Time 8   Period Weeks   Status Achieved   SLP SHORT TERM GOAL #3   Title Pt will increase divergent naming to 5-7 items per concrete category with min cues.  change to 5-7 items with min cues   Baseline 1-2   Time 8   Period Weeks   Status On-going   SLP SHORT TERM GOAL #4   Title Pt will complete basic sentence level reading comprehension tasks with 90% acc with provided mi/mod cues.  change to basic sentence level with multiple choice response with 90% with mi/mod cues   Baseline 25%   Time 8   Period Weeks   Status On-going   SLP SHORT TERM GOAL #5   Title Pt will answer moderate level auditory comprehension yes/no questions with 80% acc when provided mod cues.   Baseline 65%   Time 8   Period Weeks   Status Achieved   SLP SHORT TERM GOAL #6   Title Pt will be able to write personal/bio information with 90% acc when provided mod cues  Continue goal   Baseline name only   Time 8   Period Weeks   Status On-going   SLP SHORT TERM GOAL #7   Title Pt will generate 5-7 sentence when looking at dynamic picture with 80% acc when provided mod cues.   Baseline 75% acc with mod/max assist   Time 8   Period Weeks   Status On-going   SLP SHORT TERM GOAL #8   Title Pt will use compensatory strategies (gesture, spell, write, describe) with min prompts on 75% of opportunities in conversations   Baseline mod/max prompts to use total communication strategies   Time 8   Period Weeks   Status On-going          SLP Long Term Goals - 04/20/16 2232    SLP LONG TERM GOAL  #1   Title Pt will communicate moderate level wants/needs/thoughts to family and friends with use of multimodality communication strategies as needed.    Baseline mod/severe impairment   Time 3   Period Months   Status On-going          Plan - 04/20/16 2231    Clinical Impression Statement [name] was alert and cooperative for therapy today. Progress toward goals: Pt will increase naming of  common objects/pictures to 90% acc when provided with min assist: 95% Pt will increase divergent naming to 5-7 items per concrete category with min cues: Not addressed this date. Pt will complete basic sentence level reading comprehension tasks with 90% acc with provided mi/mod cues:75% acc with mi/mod assist.  Pt will be able to write personal/bio information with 90% acc when provided mod cues: Not addressed. Pt will generate 5-7 sentence when looking at dynamic picture with 80% acc when provided mod cues: Not addressed this date. Pt will use compensatory strategies (gesture, spell, write, describe) with min prompts on 75% of opportunities in conversations: 90% acc with mi/mod cues. Continue plan of care.     Speech Therapy Frequency 2x / week   Duration --  8 weeks   Treatment/Interventions Language facilitation;Compensatory techniques;SLP instruction and feedback;Cueing hierarchy;Patient/family education;Compensatory strategies;Multimodal communcation approach;Functional tasks   Potential to Achieve Goals Good   Potential Considerations Severity of impairments   SLP Home Exercise Plan Pt will be independent with HEP to facilitate carryover of treatment strategies and techniques in home/community environment with assist from wife as needed.    Consulted and Agree with Plan of Care Patient;Family member/caregiver   Family Member Consulted Wife, Adam [name]    l  Patient will benefit from skilled therapeutic intervention in order to improve the following deficits and impairments:    Aphasia  Dyslexia and alexia  Agraphia      G-Codes - 2016-04-30 03-26-2231    Functional Assessment Tool Used clinical judgment   Functional Limitations Spoken language expressive   Spoken Language Expression Current Status [id number]) At least 20 percent but less than 40 percent impaired, limited or restricted   Spoken Language Expression Goal Status (U0454) At least 1 percent but less than 20 percent impaired, limited or restricted      Problem List Patient Active Problem List   Diagnosis Date Noted  . Lightheadedness 01/24/2014  . Nonischemic cardiomyopathy (HCC) 10/29/2013  . Chronic systolic CHF (congestive heart failure) (HCC) 04/26/2013  . Chest discomfort 04/26/2013  . Bradycardia 04/26/2013  . Automatic implantable cardioverter-defibrillator in situ 09/04/2012  . Hypokalemia   . Disability examination   . Drug therapy   . Ventricular tachyarrhythmia (HCC) 06/06/2012  . Headache(784.0)   . Sleepiness   . Mitral regurgitation   . Cardiomyopathy (HCC)   . Ejection fraction < 50%   . Renal insufficiency    Thank you,  Adam [name], CCC-SLP [phone]  [facility] 04/30/16, 10:34 PM  [facility] [address] [place], [place], [address] Phone: [phone]   Fax:  [phone]   Name: Adam [name] MRN: [id number] Date of Birth: May 20, 1966

## 2016-04-22 ENCOUNTER — Ambulatory Visit (HOSPITAL_COMMUNITY): Payer: Medicare HMO | Admitting: Physical Therapy

## 2016-04-25 ENCOUNTER — Ambulatory Visit (HOSPITAL_COMMUNITY): Payer: Medicare HMO | Admitting: Speech Pathology

## 2016-04-25 ENCOUNTER — Ambulatory Visit (HOSPITAL_COMMUNITY): Payer: Medicare HMO | Admitting: Physical Therapy

## 2016-04-25 ENCOUNTER — Encounter (HOSPITAL_COMMUNITY): Payer: Medicare HMO

## 2016-04-25 DIAGNOSIS — R488 Other symbolic dysfunctions: Secondary | ICD-10-CM

## 2016-04-25 DIAGNOSIS — R480 Dyslexia and alexia: Secondary | ICD-10-CM

## 2016-04-25 DIAGNOSIS — R4701 Aphasia: Secondary | ICD-10-CM

## 2016-04-25 NOTE — Therapy (Signed)
[facility] [address] [place], [place], [address] Phone: [phone]   Fax:  [phone]  Speech Language Pathology Treatment  Patient Details  Name: Adam [name] MRN: [id number] Date of Birth: 1966/04/15 Referring Provider: Alison Murray  Encounter Date: 04/25/2016      End of Session - 04/25/16 1549    Visit Number 21   Number of Visits 24   Date for SLP Re-Evaluation 04/04/16   Authorization Type [employer]   Authorization Time Period 04/04/2016- 06/04/2016   Authorization - Visit Number 6   SLP Start Time 1430   SLP Stop Time  1515   SLP Time Calculation (min) 45 min   Activity Tolerance Patient tolerated treatment well      Past Medical History  Diagnosis Date  . Mitral regurgitation   . NICM (nonischemic cardiomyopathy) (HCC)     Nonischemic / catheterization October, 2011, normal coronary arteries  . Chronic systolic heart failure (HCC)   . Ejection fraction < 50%     Echo 06/05/12: Mild LVH, EF 20%, posterior severe HK, anterolateral severe HK, anterior severe HK, mid to apical septal AK, AK of the true apex, apical inferior HK, restrictive physiology, trivial MR, moderate LAE, mildly reduced RV function, mild TR.  . ICD (implantable cardiac defibrillator) battery depletion 11/11/10    [employer] Jude, Dr.Allred  . Renal insufficiency     Creatinine 1.5 (GFR greater than 50)  . Headache(784.0)     Patient seen to have headaches from spironolactone.  Drug was stopped, August, 2012  . Sleepiness     has some sleepiness during the day  . 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 VT (ventricular tachycardia) (HCC)     VT storm 7/13 - Sotalol started  . Drug therapy     Sotalol started for VT storm  July, 2013  . Hypokalemia     There was some decrease in potassium with his admission in July, 2013. We will watch this very carefully.  . Disability examination     Discussion of disability September, 2013     Past Surgical History  Procedure Laterality Date  . Tee with cardioversion  11/17/08    No LV clot  . Cardiac defibrillator placement  11/11/10    SJM Fortify DR ICD implanted by Dr Johney Frame    There were no vitals filed for this visit.      Subjective Assessment - 04/25/16 1431    Subjective "My daughter and sister came to visit this weekend."   Currently in Pain? No/denies               ADULT SLP TREATMENT - 04/25/16 1547    General Information   Behavior/Cognition Alert;Cooperative;Pleasant mood   Patient Positioning Upright in chair   Oral care provided N/A   HPI [name] is a 50 yo male from [place], [place] with a past medical history of atrial fibrillation, CHF, s/p cardiac pacemaker implantation, and gout who developed right hemiplegia and global aphasia on 01/09/2016. He was taken to [facility] where his studies showed left MCA stroke. He received IV TPA at [facility] and was airlifted to [facility] as stroke code. 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 He developed SOB and was started on BIPAP during that hospitalization. He was transferred to an inpatient rehabilitation facility on 01/15/16, but showed seizure activity on 01/19/16 and was  re-hospitalized. He was discharged home from that hospitalization on 01/20/2016. He has not received speech therapy since 01/19/16 unfortunately. He was referred by Dr. Alison Murray for outpatient SLP services due to aphasia from recent stroke. He was accompanied to the appointment by [name], his wife   Treatment Provided   Treatment provided Cognitive-Linquistic   Pain Assessment   Pain Assessment No/denies pain   Cognitive-Linquistic Treatment   Treatment focused on Aphasia;Patient/family/caregiver education   Skilled Treatment pt education, compensatory strategies, word finding activities, automatic speech drills   Assessment / Recommendations /  Plan   Plan Continue with current plan of care   Progression Toward Goals   Progression toward goals Progressing toward goals            SLP Short Term Goals - 04/25/16 1549    SLP SHORT TERM GOAL #1   Title Pt will increase naming of common objects/pictures to 90% acc when provided with min assist  change to 90% acc with min assist   Baseline 72%   Time 8   Period Weeks   Status On-going   SLP SHORT TERM GOAL #2   Title Pt will complete single word sentence completion tasks (SWSC) with 90% acc when provided with mod multimodality cues.   Baseline 70%   Time 8   Period Weeks   Status Achieved   SLP SHORT TERM GOAL #3   Title Pt will increase divergent naming to 5-7 items per concrete category with min cues.  change to 5-7 items with min cues   Baseline 1-2   Time 8   Period Weeks   Status On-going   SLP SHORT TERM GOAL #4   Title Pt will complete basic sentence level reading comprehension tasks with 90% acc with provided mi/mod cues.  change to basic sentence level with multiple choice response with 90% with mi/mod cues   Baseline 25%   Time 8   Period Weeks   Status On-going   SLP SHORT TERM GOAL #5   Title Pt will answer moderate level auditory comprehension yes/no questions with 80% acc when provided mod cues.   Baseline 65%   Time 8   Period Weeks   Status Achieved   SLP SHORT TERM GOAL #6   Title Pt will be able to write personal/bio information with 90% acc when provided mod cues  Continue goal   Baseline name only   Time 8   Period Weeks   Status On-going   SLP SHORT TERM GOAL #7   Title Pt will generate 5-7 sentence when looking at dynamic picture with 80% acc when provided mod cues.   Baseline 75% acc with mod/max assist   Time 8   Period Weeks   Status On-going   SLP SHORT TERM GOAL #8   Title Pt will use compensatory strategies (gesture, spell, write, describe) with min prompts on 75% of opportunities in conversations   Baseline mod/max prompts  to use total communication strategies   Time 8   Period Weeks   Status On-going          SLP Long Term Goals - 04/25/16 1550    SLP LONG TERM GOAL #1   Title Pt will communicate moderate level wants/needs/thoughts to family and friends with use of multimodality communication strategies as needed.    Baseline mod/severe impairment   Time 3   Period Months   Status On-going          Plan - 04/25/16 1549  Clinical Impression Statement Mr. Basnett was alert and cooperative for therapy today. Progress toward goals: Pt will increase naming of common objects/pictures to 90% acc when provided with min assist: 95% Pt will increase divergent naming to 5-7 items per concrete category with min cues: Not addressed this date. Pt will complete basic sentence level reading comprehension tasks with 90% acc with provided mi/mod cues:80% acc with mi/mod assist.  Pt will be able to write personal/bio information with 90% acc when provided mod cues: Not addressed. Pt will generate 5-7 sentence when looking at dynamic picture with 80% acc when provided mod cues: 90% min assist. Pt will use compensatory strategies (gesture, spell, write, describe) with min prompts on 75% of opportunities in conversations: 90% acc with min cues. Pt benefited from SLP cues to scaffold reading comprehension and synonym task. Continue plan of care.     Speech Therapy Frequency 2x / week   Duration --  8 weeks   Treatment/Interventions Language facilitation;Compensatory techniques;SLP instruction and feedback;Cueing hierarchy;Patient/family education;Compensatory strategies;Multimodal communcation approach;Functional tasks   Potential to Achieve Goals Good   Potential Considerations Severity of impairments   SLP Home Exercise Plan Pt will be independent with HEP to facilitate carryover of treatment strategies and techniques in home/community environment with assist from wife as needed.    Consulted and Agree with Plan of Care  Patient;Family member/caregiver   Family Member Consulted Wife, [name]      Patient will benefit from skilled therapeutic intervention in order to improve the following deficits and impairments:   Aphasia  Dyslexia and alexia  Agraphia    Problem List Patient Active Problem List   Diagnosis Date Noted  . Lightheadedness 01/24/2014  . Nonischemic cardiomyopathy (HCC) 10/29/2013  . Chronic systolic CHF (congestive heart failure) (HCC) 04/26/2013  . Chest discomfort 04/26/2013  . Bradycardia 04/26/2013  . Automatic implantable cardioverter-defibrillator in situ 09/04/2012  . Hypokalemia   . Disability examination   . Drug therapy   . Ventricular tachyarrhythmia (HCC) 06/06/2012  . Headache(784.0)   . Sleepiness   . Mitral regurgitation   . Cardiomyopathy (HCC)   . Ejection fraction < 50%   . Renal insufficiency    Thank you,  Havery Moros, CCC-SLP [phone]  [facility] 04/25/2016, 3:50 PM  [facility] [address] [place], [place], [address] Phone: [phone]   Fax:  [phone]   Name: Jashun Puertas [place] MRN: [id number] Date of Birth: 1966-07-22

## 2016-04-27 ENCOUNTER — Ambulatory Visit (HOSPITAL_COMMUNITY): Payer: Medicare HMO

## 2016-04-27 ENCOUNTER — Ambulatory Visit (HOSPITAL_COMMUNITY): Payer: Medicare HMO | Admitting: Speech Pathology

## 2016-04-27 DIAGNOSIS — R488 Other symbolic dysfunctions: Secondary | ICD-10-CM

## 2016-04-27 DIAGNOSIS — R480 Dyslexia and alexia: Secondary | ICD-10-CM

## 2016-04-27 DIAGNOSIS — R4701 Aphasia: Secondary | ICD-10-CM

## 2016-04-27 NOTE — Therapy (Signed)
[facility] [address] [place], [place], [address] Phone: [phone]   Fax:  [phone]  Speech Language Pathology Treatment  Patient Details  Name: Adam [name] MRN: [id number] Date of Birth: 09-18-1966 Referring Provider: Alison Murray  Encounter Date: 04/27/2016      End of Session - 04/27/16 1339    Visit Number 22   Number of Visits 24   Date for SLP Re-Evaluation 04/04/16   Authorization Type [employer]   Authorization Time Period 04/04/2016- 06/04/2016   Authorization - Visit Number 7   SLP Start Time 1300   SLP Stop Time  1345   SLP Time Calculation (min) 45 min   Activity Tolerance Patient tolerated treatment well      Past Medical History  Diagnosis Date  . Mitral regurgitation   . NICM (nonischemic cardiomyopathy) (HCC)     Nonischemic / catheterization October, 2011, normal coronary arteries  . Chronic systolic heart failure (HCC)   . Ejection fraction < 50%     Echo 06/05/12: Mild LVH, EF 20%, posterior severe HK, anterolateral severe HK, anterior severe HK, mid to apical septal AK, AK of the true apex, apical inferior HK, restrictive physiology, trivial MR, moderate LAE, mildly reduced RV function, mild TR.  . ICD (implantable cardiac defibrillator) battery depletion 11/11/10    [employer]. Jude, Dr.Allred  . Renal insufficiency     Creatinine 1.5 (GFR greater than 50)  . Headache(784.0)     Patient seen to have headaches from spironolactone.  Drug was stopped, August, 2012  . Sleepiness     has some sleepiness during the day  . 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 VT (ventricular tachycardia) (HCC)     VT storm 7/13 - Sotalol started  . Drug therapy     Sotalol started for VT storm  July, 2013  . Hypokalemia     There was some decrease in potassium with his admission in July, 2013. We will watch this very carefully.  . Disability examination     Discussion of disability September, 2013     Past Surgical History  Procedure Laterality Date  . Tee with cardioversion  11/17/08    No LV clot  . Cardiac defibrillator placement  11/11/10    SJM Fortify DR ICD implanted by Dr Johney Frame    There were no vitals filed for this visit.      Subjective Assessment - 04/27/16 1324    Subjective "I have a hard time going to sleep after I take my evening medication."   Currently in Pain? No/denies               ADULT SLP TREATMENT - 04/27/16 1325    General Information   Behavior/Cognition Alert;Cooperative;Pleasant mood   Patient Positioning Upright in chair   Oral care provided N/A   HPI [name] is a 50 yo male from [place], [place] with a past medical history of atrial fibrillation, CHF, s/p cardiac pacemaker implantation, and gout who developed right hemiplegia and global aphasia on 01/09/2016. He was taken to [facility] where his studies showed left MCA stroke. He received IV TPA at [facility] and was airlifted to [facility] as stroke code. 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 He developed SOB and was started on BIPAP during that hospitalization. He was transferred to an inpatient rehabilitation facility on 01/15/16, but showed seizure  activity on 01/19/16 and was re-hospitalized. He was discharged home from that hospitalization on 01/20/2016. He has not received speech therapy since 01/19/16 unfortunately. He was referred by Dr. Alison Murray for outpatient SLP services due to aphasia from recent stroke. He was accompanied to the appointment by [name], his wife   Treatment Provided   Treatment provided Cognitive-Linquistic   Pain Assessment   Pain Assessment No/denies pain   Cognitive-Linquistic Treatment   Treatment focused on Aphasia;Patient/family/caregiver education   Skilled Treatment pt education, compensatory strategies, word finding activities, automatic speech drills   Assessment /  Recommendations / Plan   Plan Continue with current plan of care   Progression Toward Goals   Progression toward goals Progressing toward goals            SLP Short Term Goals - 04/27/16 1350    SLP SHORT TERM GOAL #1   Title Pt will increase naming of common objects/pictures to 90% acc when provided with min assist  change to 90% acc with min assist   Baseline 72%   Time 8   Period Weeks   Status On-going   SLP SHORT TERM GOAL #2   Title Pt will complete single word sentence completion tasks [facility]) with 90% acc when provided with mod multimodality cues.   Baseline 70%   Time 8   Period Weeks   Status Achieved   SLP SHORT TERM GOAL #3   Title Pt will increase divergent naming to 5-7 items per concrete category with min cues.  change to 5-7 items with min cues   Baseline 1-2   Time 8   Period Weeks   Status On-going   SLP SHORT TERM GOAL #4   Title Pt will complete basic sentence level reading comprehension tasks with 90% acc with provided mi/mod cues.  change to basic sentence level with multiple choice response with 90% with mi/mod cues   Baseline 25%   Time 8   Period Weeks   Status On-going   SLP SHORT TERM GOAL #5   Title Pt will answer moderate level auditory comprehension yes/no questions with 80% acc when provided mod cues.   Baseline 65%   Time 8   Period Weeks   Status Achieved   SLP SHORT TERM GOAL #6   Title Pt will be able to write personal/bio information with 90% acc when provided mod cues  Continue goal   Baseline name only   Time 8   Period Weeks   Status On-going   SLP SHORT TERM GOAL #7   Title Pt will generate 5-7 word sentence when looking at dynamic picture with 80% acc when provided mod cues.   Baseline 75% acc with mod/max assist   Time 8   Period Weeks   Status On-going   SLP SHORT TERM GOAL #8   Title Pt will use compensatory strategies (gesture, spell, write, describe) with min prompts on 75% of opportunities in conversations    Baseline mod/max prompts to use total communication strategies   Time 8   Period Weeks   Status On-going          SLP Long Term Goals - 04/27/16 1351    SLP LONG TERM GOAL #1   Title Pt will communicate moderate level wants/needs/thoughts to family and friends with use of multimodality communication strategies as needed.    Baseline mod/severe impairment   Time 3   Period Months   Status On-going  Plan - 04/27/16 1349    Clinical Impression Statement Mr. Vuncannon was alert and cooperative for therapy today. Progress toward goals: Pt will increase naming of common objects/pictures to 90% acc when provided with min assist: N/A. Pt will increase divergent naming to 5-7 items per concrete category with min cues: Not addressed this date. Pt will complete basic sentence level reading comprehension tasks with 90% acc with provided mi/mod cues:Not addressed this date.  Pt will be able to write personal/bio information with 90% acc when provided mod cues: Pt completed with 100% acc when provided min cues. He was also shown how to store his personal address in his phone for easy access when needed. Pt will generate 5-7 word sentence when looking at dynamic picture with 80% acc when provided mod cues: Not addressed this date. Pt will use compensatory strategies (gesture, spell, write, describe) with min prompts on 75% of opportunities in conversations: 90% acc with min cues. Pt independently making more efforts to use multimodality cues to increase verbal expressive skills. He often rejects assist from SLP and verbalizes, "what is it called-- it is called ____" to allow extra time for word finding. He was able to name to description with 100% acc when provided mi/mod cues and allowance for extra time to self correct as needed. Continue plan of care.     Speech Therapy Frequency 2x / week   Duration --  8 weeks   Treatment/Interventions Language facilitation;Compensatory techniques;SLP  instruction and feedback;Cueing hierarchy;Patient/family education;Compensatory strategies;Multimodal communcation approach;Functional tasks   Potential to Achieve Goals Good   Potential Considerations Severity of impairments   SLP Home Exercise Plan Pt will be independent with HEP to facilitate carryover of treatment strategies and techniques in home/community environment with assist from wife as needed.    Consulted and Agree with Plan of Care Patient;Family member/caregiver   Family Member Consulted Wife, [name]      Patient will benefit from skilled therapeutic intervention in order to improve the following deficits and impairments:   Aphasia  Dyslexia and alexia  Agraphia    Problem List Patient Active Problem List   Diagnosis Date Noted  . Lightheadedness 01/24/2014  . Nonischemic cardiomyopathy (HCC) 10/29/2013  . Chronic systolic CHF (congestive heart failure) (HCC) 04/26/2013  . Chest discomfort 04/26/2013  . Bradycardia 04/26/2013  . Automatic implantable cardioverter-defibrillator in situ 09/04/2012  . Hypokalemia   . Disability examination   . Drug therapy   . Ventricular tachyarrhythmia (HCC) 06/06/2012  . Headache(784.0)   . Sleepiness   . Mitral regurgitation   . Cardiomyopathy (HCC)   . Ejection fraction < 50%   . Renal insufficiency    Thank you,  Havery Moros, CCC-SLP [phone]  [facility] 04/27/2016, 1:52 PM  [facility] [address] [place], [place], [address] Phone: [phone]   Fax:  [phone]   Name: Adam [name] MRN: [id number] Date of Birth: 08/22/1966

## 2016-04-29 ENCOUNTER — Ambulatory Visit (HOSPITAL_COMMUNITY): Payer: Medicare HMO | Admitting: Physical Therapy

## 2016-05-02 ENCOUNTER — Ambulatory Visit (HOSPITAL_COMMUNITY): Payer: Medicare HMO | Admitting: Speech Pathology

## 2016-05-02 ENCOUNTER — Encounter (HOSPITAL_COMMUNITY): Payer: Medicare HMO

## 2016-05-02 ENCOUNTER — Ambulatory Visit (HOSPITAL_COMMUNITY): Payer: Medicare HMO | Admitting: Physical Therapy

## 2016-05-02 DIAGNOSIS — R488 Other symbolic dysfunctions: Secondary | ICD-10-CM

## 2016-05-02 DIAGNOSIS — R4701 Aphasia: Secondary | ICD-10-CM | POA: Diagnosis not present

## 2016-05-02 DIAGNOSIS — R480 Dyslexia and alexia: Secondary | ICD-10-CM

## 2016-05-02 NOTE — Therapy (Signed)
[name] [address] [place], [place], [address] Phone: [phone]   Fax:  [phone]  Speech Language Pathology Treatment  Patient Details  Name: Adam [name] MRN: [id number] Date of Birth: 07/01/1966 Referring Provider: Alison Murray  Encounter Date: 05/02/2016      End of Session - 05/02/16 1137    Visit Number 23   Number of Visits 36   Date for SLP Re-Evaluation 04/04/16   Authorization Type [employer] Medicare; Medicaid   Authorization Time Period 04/04/2016- 06/04/2016   Authorization - Visit Number 8   Authorization - Number of Visits 12   SLP Start Time 1115   SLP Stop Time  1200   SLP Time Calculation (min) 45 min   Activity Tolerance Patient tolerated treatment well      Past Medical History  Diagnosis Date  . Mitral regurgitation   . NICM (nonischemic cardiomyopathy) (HCC)     Nonischemic / catheterization October, 2011, normal coronary arteries  . Chronic systolic heart failure (HCC)   . Ejection fraction < 50%     Echo 06/05/12: Mild LVH, EF 20%, posterior severe HK, anterolateral severe HK, anterior severe HK, mid to apical septal AK, AK of the true apex, apical inferior HK, restrictive physiology, trivial MR, moderate LAE, mildly reduced RV function, mild TR.  . ICD (implantable cardiac defibrillator) battery depletion 11/11/10    St. Jude, Dr.Allred  . Renal insufficiency     Creatinine 1.5 (GFR greater than 50)  . Headache(784.0)     Patient seen to have headaches from spironolactone.  Drug was stopped, August, 2012  . Sleepiness     has some sleepiness during the day  . 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 VT (ventricular tachycardia) (HCC)     VT storm 7/13 - Sotalol started  . Drug therapy     Sotalol started for VT storm  July, 2013  . Hypokalemia     There was some decrease in potassium with his admission in July, 2013. We will watch this very carefully.  . Disability examination    Discussion of disability September, 2013    Past Surgical History  Procedure Laterality Date  . Tee with cardioversion  11/17/08    No LV clot  . Cardiac defibrillator placement  11/11/10    SJM Fortify DR ICD implanted by Dr Johney Frame    There were no vitals filed for this visit.      Subjective Assessment - 05/02/16 1134    Subjective "I didn't really do anything this weekend."   Currently in Pain? No/denies               ADULT SLP TREATMENT - 05/02/16 1134    General Information   Behavior/Cognition Alert;Cooperative;Pleasant mood   Patient Positioning Upright in chair   Oral care provided N/A   HPI Adam [name] is a 50 yo male from [place], [place] with a past medical history of atrial fibrillation, CHF, s/p cardiac pacemaker implantation, and gout who developed right hemiplegia and global aphasia on 01/09/2016. He was taken to [name] where his studies showed left MCA stroke. He received IV TPA at [facility] and was airlifted to [name] as stroke code. 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 He developed SOB and was started on BIPAP during that hospitalization. He was transferred to an inpatient rehabilitation facility on 01/15/16, but showed seizure  activity on 01/19/16 and was re-hospitalized. He was discharged home from that hospitalization on 01/20/2016. He has not received speech therapy since 01/19/16 unfortunately. He was referred by Dr. Alison Murray for outpatient SLP services due to aphasia from recent stroke. He was accompanied to the appointment by Adam [name], his wife   Treatment Provided   Treatment provided Cognitive-Linquistic   Pain Assessment   Pain Assessment No/denies pain   Cognitive-Linquistic Treatment   Treatment focused on Aphasia;Patient/family/caregiver education   Skilled Treatment pt education, compensatory strategies, word finding activities, automatic speech drills    Assessment / Recommendations / Plan   Plan Continue with current plan of care   Progression Toward Goals   Progression toward goals Progressing toward goals            SLP Short Term Goals - 05/02/16 1138    SLP SHORT TERM GOAL #1   Title Pt will increase naming of common objects/pictures to 90% acc when provided with min assist  change to 90% acc with min assist   Baseline 72%   Time 8   Period Weeks   Status On-going   SLP SHORT TERM GOAL #2   Title Pt will complete single word sentence completion tasks (SWSC) with 90% acc when provided with mod multimodality cues.   Baseline 70%   Time 8   Period Weeks   Status Achieved   SLP SHORT TERM GOAL #3   Title Pt will increase divergent naming to 5-7 items per concrete category with min cues.  change to 5-7 items with min cues   Baseline 1-2   Time 8   Period Weeks   Status On-going   SLP SHORT TERM GOAL #4   Title Pt will complete basic sentence level reading comprehension tasks with 90% acc with provided mi/mod cues.  change to basic sentence level with multiple choice response with 90% with mi/mod cues   Baseline 25%   Time 8   Period Weeks   Status On-going   SLP SHORT TERM GOAL #5   Title Pt will answer moderate level auditory comprehension yes/no questions with 80% acc when provided mod cues.   Baseline 65%   Time 8   Period Weeks   Status Achieved   SLP SHORT TERM GOAL #6   Title Pt will be able to write personal/bio information with 90% acc when provided mod cues  Continue goal   Baseline name only   Time 8   Period Weeks   Status On-going   SLP SHORT TERM GOAL #7   Title Pt will generate 5-7 word sentence when looking at dynamic picture with 80% acc when provided mod cues.   Baseline 75% acc with mod/max assist   Time 8   Period Weeks   Status On-going   SLP SHORT TERM GOAL #8   Title Pt will use compensatory strategies (gesture, spell, write, describe) with min prompts on 75% of opportunities in  conversations   Baseline mod/max prompts to use total communication strategies   Time 8   Period Weeks   Status On-going          SLP Long Term Goals - 05/02/16 1138    SLP LONG TERM GOAL #1   Title Pt will communicate moderate level wants/needs/thoughts to family and friends with use of multimodality communication strategies as needed.    Baseline mod/severe impairment   Time 3   Period Months   Status On-going  Plan - 05/02/16 1138    Clinical Impression Statement Adam [name] was alert and cooperative for therapy today. Progress toward goals: Pt will increase naming of common objects/pictures to 90% acc when provided with min assist: N/A. Pt will increase divergent naming to 5-7 items per concrete category with min cues: 93% with mi/mod assist. Pt will complete basic sentence level reading comprehension tasks with 90% acc with provided mi/mod cues: 85% with mod/max assist.  Pt will be able to write personal/bio information with 90% acc when provided mod cues: Provided written template for him to practice at home. He did independently present his driver license to check his zip code. Pt will generate 5-7 word sentence when looking at dynamic picture with 80% acc when provided mod cues: Not addressed this date. Pt will use compensatory strategies (gesture, spell, write, describe) with min prompts on 75% of opportunities in conversations: 90% acc with min cues. Pt independently making more efforts to use multimodality cues to increase verbal expressive skills.  Continue plan of care.     Speech Therapy Frequency 2x / week   Duration --  8 weeks   Treatment/Interventions Language facilitation;Compensatory techniques;SLP instruction and feedback;Cueing hierarchy;Patient/family education;Compensatory strategies;Multimodal communcation approach;Functional tasks   Potential to Achieve Goals Good   Potential Considerations Severity of impairments   SLP Home Exercise Plan Pt will be  independent with HEP to facilitate carryover of treatment strategies and techniques in home/community environment with assist from wife as needed.    Consulted and Agree with Plan of Care Patient;Family member/caregiver   Family Member Consulted Wife, Adam [name]      Patient will benefit from skilled therapeutic intervention in order to improve the following deficits and impairments:   Aphasia  Dyslexia and alexia  Agraphia    Problem List Patient Active Problem List   Diagnosis Date Noted  . Lightheadedness 01/24/2014  . Nonischemic cardiomyopathy (HCC) 10/29/2013  . Chronic systolic CHF (congestive heart failure) (HCC) 04/26/2013  . Chest discomfort 04/26/2013  . Bradycardia 04/26/2013  . Automatic implantable cardioverter-defibrillator in situ 09/04/2012  . Hypokalemia   . Disability examination   . Drug therapy   . Ventricular tachyarrhythmia (HCC) 06/06/2012  . Headache(784.0)   . Sleepiness   . Mitral regurgitation   . Cardiomyopathy (HCC)   . Ejection fraction < 50%   . Renal insufficiency    Thank you,  Havery Moros, CCC-SLP [phone]  [facility] 05/02/2016, 12:22 PM  [facility] [address] [place], [place], [address] Phone: [phone]   Fax:  [phone]   Name: Srihaan Beevers Sturgill MRN: [id number] Date of Birth: 11/29/1965

## 2016-05-04 ENCOUNTER — Ambulatory Visit (HOSPITAL_COMMUNITY): Payer: Medicare HMO

## 2016-05-04 ENCOUNTER — Encounter (HOSPITAL_COMMUNITY): Payer: Medicare HMO | Admitting: Occupational Therapy

## 2016-05-04 ENCOUNTER — Ambulatory Visit (HOSPITAL_COMMUNITY): Payer: Medicare HMO | Admitting: Speech Pathology

## 2016-05-04 DIAGNOSIS — R4701 Aphasia: Secondary | ICD-10-CM | POA: Diagnosis not present

## 2016-05-04 DIAGNOSIS — R488 Other symbolic dysfunctions: Secondary | ICD-10-CM

## 2016-05-04 DIAGNOSIS — R480 Dyslexia and alexia: Secondary | ICD-10-CM

## 2016-05-04 NOTE — Therapy (Signed)
[facility] [address] [place], [place], [address] Phone: [phone]   Fax:  [phone]  Speech Language Pathology Treatment  Patient Details  Name: Adam [name] MRN: [id number] Date of Birth: 08-25-1966 Referring Provider: Alison Murray  Encounter Date: 05/04/2016      End of Session - 05/04/16 1234    Visit Number 24   Number of Visits 36   Date for SLP Re-Evaluation 04/04/16   Authorization Type [employer] Medicare; Medicaid   Authorization Time Period 04/04/2016- 06/04/2016   Authorization - Visit Number 9   Authorization - Number of Visits 12   SLP Start Time 1115   SLP Stop Time  1200   SLP Time Calculation (min) 45 min   Activity Tolerance Patient tolerated treatment well      Past Medical History  Diagnosis Date  . Mitral regurgitation   . NICM (nonischemic cardiomyopathy) (HCC)     Nonischemic / catheterization October, 2011, normal coronary arteries  . Chronic systolic heart failure (HCC)   . Ejection fraction < 50%     Echo 06/05/12: Mild LVH, EF 20%, posterior severe HK, anterolateral severe HK, anterior severe HK, mid to apical septal AK, AK of the true apex, apical inferior HK, restrictive physiology, trivial MR, moderate LAE, mildly reduced RV function, mild TR.  . ICD (implantable cardiac defibrillator) battery depletion 11/11/10    St. Jude, Dr.Allred  . Renal insufficiency     Creatinine 1.5 (GFR greater than 50)  . Headache(784.0)     Patient seen to have headaches from spironolactone.  Drug was stopped, August, 2012  . Sleepiness     has some sleepiness during the day  . 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 VT (ventricular tachycardia) (HCC)     VT storm 7/13 - Sotalol started  . Drug therapy     Sotalol started for VT storm  July, 2013  . Hypokalemia     There was some decrease in potassium with his admission in July, 2013. We will watch this very carefully.  . Disability examination      Discussion of disability September, 2013    Past Surgical History  Procedure Laterality Date  . Tee with cardioversion  11/17/08    No LV clot  . Cardiac defibrillator placement  11/11/10    SJM Fortify DR ICD implanted by Dr Johney Frame    There were no vitals filed for this visit.      Subjective Assessment - 05/04/16 1157    Subjective "My knee is bothering me."   Currently in Pain? Yes   Pain Score 5    Pain Location Knee   Pain Orientation Left   Pain Type --  Gout flare up   Pain Onset Yesterday               ADULT SLP TREATMENT - 05/04/16 1231    General Information   Behavior/Cognition Alert;Cooperative;Pleasant mood   Patient Positioning Upright in chair   Oral care provided N/A   HPI [name] is a 50 yo male from [place], [place] with a past medical history of atrial fibrillation, CHF, s/p cardiac pacemaker implantation, and gout who developed right hemiplegia and global aphasia on 01/09/2016. He was taken to [facility] where his studies showed left MCA stroke. He received IV TPA at [facility] and was airlifted to [facility] as stroke code. He has a history of previous stroke in the right occipital cortex per scan,  but this was unknown to pt/wife. He developed SOB and was started on BIPAP during that hospitalization. He was transferred to an inpatient rehabilitation facility on 01/15/16, but showed seizure activity on 01/19/16 and was re-hospitalized. He was discharged home from that hospitalization on 01/20/2016. He has not received speech therapy since 01/19/16 unfortunately. He was referred by Dr. Alison Murray for outpatient SLP services due to aphasia from recent stroke. He was accompanied to the appointment by [name], his wife   Treatment Provided   Treatment provided Cognitive-Linquistic   Pain Assessment   Pain Assessment 0-10   Pain Score 5    Pain Location knee   Cognitive-Linquistic Treatment   Treatment focused on  Aphasia;Patient/family/caregiver education   Skilled Treatment pt education, compensatory strategies, word finding activities, automatic speech drills   Assessment / Recommendations / Plan   Plan Continue with current plan of care   Progression Toward Goals   Progression toward goals Progressing toward goals            SLP Short Term Goals - 05/04/16 1236    SLP SHORT TERM GOAL #1   Title Pt will increase naming of common objects/pictures to 90% acc when provided with min assist  change to 90% acc with min assist   Baseline 72%   Time 8   Period Weeks   Status On-going   SLP SHORT TERM GOAL #2   Title Pt will complete single word sentence completion tasks (SWSC) with 90% acc when provided with mod multimodality cues.   Baseline 70%   Time 8   Period Weeks   Status Achieved   SLP SHORT TERM GOAL #3   Title Pt will increase divergent naming to 5-7 items per concrete category with min cues.  change to 5-7 items with min cues   Baseline 1-2   Time 8   Period Weeks   Status On-going   SLP SHORT TERM GOAL #4   Title Pt will complete basic sentence level reading comprehension tasks with 90% acc with provided mi/mod cues.  change to basic sentence level with multiple choice response with 90% with mi/mod cues   Baseline 25%   Time 8   Period Weeks   Status On-going   SLP SHORT TERM GOAL #5   Title Pt will answer moderate level auditory comprehension yes/no questions with 80% acc when provided mod cues.   Baseline 65%   Time 8   Period Weeks   Status Achieved   SLP SHORT TERM GOAL #6   Title Pt will be able to write personal/bio information with 90% acc when provided mod cues  Continue goal   Baseline name only   Time 8   Period Weeks   Status On-going   SLP SHORT TERM GOAL #7   Title Pt will generate 5-7 word sentence when looking at dynamic picture with 80% acc when provided mod cues.   Baseline 75% acc with mod/max assist   Time 8   Period Weeks   Status On-going    SLP SHORT TERM GOAL #8   Title Pt will use compensatory strategies (gesture, spell, write, describe) with min prompts on 75% of opportunities in conversations   Baseline mod/max prompts to use total communication strategies   Time 8   Period Weeks   Status On-going          SLP Long Term Goals - 05/04/16 1236    SLP LONG TERM GOAL #1   Title Pt will communicate moderate  level wants/needs/thoughts to family and friends with use of multimodality communication strategies as needed.    Baseline mod/severe impairment   Time 3   Period Months   Status On-going          Plan - 05/04/16 1235    Clinical Impression Statement Mr. Capley complained of left knee pain today with a rating of 5/10, which he attributes to a gout flare up.  Progress toward goals: Pt will increase naming of common objects/pictures to 90% acc when provided with min assist: N/A. Pt will increase divergent naming to 5-7 items per concrete category with min cues: 90% with mi/mod assist when naming "good" and "bad" foods for someone with gout. Pt will complete basic sentence level reading comprehension tasks with 90% acc with provided mi/mod cues: 90% with min assist (also gave this for homework).  Pt will be able to write personal/bio information with 90% acc when provided mod cues: Completed for homework with template/model and also in session with 90% acc when provided mod cues. He independently used his driver's license for his address. SLP suggested that he use abbreviations for months and days. Pt will generate 5-7 word sentence when looking at dynamic picture with 80% acc when provided mod cues: Not addressed this date. Pt will use compensatory strategies (gesture, spell, write, describe) with min prompts on 75% of opportunities in conversations: 85% acc with min cues. Pt independently making more efforts to use multimodality cues to increase verbal expressive skills.  Continue plan of care.   Speech Therapy Frequency 2x  / week   Duration --  8 weeks   Treatment/Interventions Language facilitation;Compensatory techniques;SLP instruction and feedback;Cueing hierarchy;Patient/family education;Compensatory strategies;Multimodal communcation approach;Functional tasks   Potential to Achieve Goals Good   Potential Considerations Severity of impairments   SLP Home Exercise Plan Pt will be independent with HEP to facilitate carryover of treatment strategies and techniques in home/community environment with assist from wife as needed.    Consulted and Agree with Plan of Care Patient;Family member/caregiver   Family Member Consulted Wife, [name]      Patient will benefit from skilled therapeutic intervention in order to improve the following deficits and impairments:   Aphasia  Dyslexia and alexia  Agraphia    Problem List Patient Active Problem List   Diagnosis Date Noted  . Lightheadedness 01/24/2014  . Nonischemic cardiomyopathy (HCC) 10/29/2013  . Chronic systolic CHF (congestive heart failure) (HCC) 04/26/2013  . Chest discomfort 04/26/2013  . Bradycardia 04/26/2013  . Automatic implantable cardioverter-defibrillator in situ 09/04/2012  . Hypokalemia   . Disability examination   . Drug therapy   . Ventricular tachyarrhythmia (HCC) 06/06/2012  . Headache(784.0)   . Sleepiness   . Mitral regurgitation   . Cardiomyopathy (HCC)   . Ejection fraction < 50%   . Renal insufficiency    Thank you,  Havery Moros, CCC-SLP [phone]  [facility] 05/04/2016, 12:37 PM  [facility] [address] [place], [place], [address] Phone: [phone]   Fax:  [phone]   Name: Ramsay Bognar Augenstein MRN: [id number] Date of Birth: June 12, 1966

## 2016-05-06 ENCOUNTER — Ambulatory Visit (HOSPITAL_COMMUNITY): Payer: Medicare HMO

## 2016-05-09 ENCOUNTER — Telehealth (HOSPITAL_COMMUNITY): Payer: Self-pay

## 2016-05-09 ENCOUNTER — Encounter (HOSPITAL_COMMUNITY): Payer: Medicare HMO | Admitting: Speech Pathology

## 2016-05-09 NOTE — Telephone Encounter (Signed)
05/09/16 called to say he wasn't feeling well today

## 2016-05-12 ENCOUNTER — Ambulatory Visit (HOSPITAL_COMMUNITY): Payer: Medicare HMO | Attending: Internal Medicine | Admitting: Speech Pathology

## 2016-05-12 DIAGNOSIS — R4701 Aphasia: Secondary | ICD-10-CM | POA: Insufficient documentation

## 2016-05-12 DIAGNOSIS — R480 Dyslexia and alexia: Secondary | ICD-10-CM | POA: Insufficient documentation

## 2016-05-12 DIAGNOSIS — R488 Other symbolic dysfunctions: Secondary | ICD-10-CM | POA: Insufficient documentation

## 2016-05-12 NOTE — Therapy (Signed)
[facility] [place] [address] [place], [place], [address] Phone: [phone]   Fax:  [phone]  Speech Language Pathology Treatment  Patient Details  Name: Adam [name] MRN: [id number] Date of Birth: 1966-10-21 Referring Provider: Alison Murray  Encounter Date: 05/12/2016      End of Session - 05/12/16 1744    Visit Number 25   Number of Visits 36   Date for SLP Re-Evaluation 06/04/16   Authorization Type [employer]; Medicaid   Authorization Time Period 04/04/2016- 06/04/2016   Authorization - Visit Number 10   Authorization - Number of Visits 14   SLP Start Time 1350   SLP Stop Time  1435   SLP Time Calculation (min) 45 min   Activity Tolerance Patient tolerated treatment well      Past Medical History  Diagnosis Date  . Mitral regurgitation   . NICM (nonischemic cardiomyopathy) (HCC)     Nonischemic / catheterization October, 2011, normal coronary arteries  . Chronic systolic heart failure (HCC)   . Ejection fraction < 50%     Echo 06/05/12: Mild LVH, EF 20%, posterior severe HK, anterolateral severe HK, anterior severe HK, mid to apical septal AK, AK of the true apex, apical inferior HK, restrictive physiology, trivial MR, moderate LAE, mildly reduced RV function, mild TR.  . ICD (implantable cardiac defibrillator) battery depletion 11/11/10    [employer] Jude, Dr.Allred  . Renal insufficiency     Creatinine 1.5 (GFR greater than 50)  . Headache(784.0)     Patient seen to have headaches from spironolactone.  Drug was stopped, August, 2012  . Sleepiness     has some sleepiness during the day  . 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 VT (ventricular tachycardia) (HCC)     VT storm 7/13 - Sotalol started  . Drug therapy     Sotalol started for VT storm  July, 2013  . Hypokalemia     There was some decrease in potassium with his admission in July, 2013. We will watch this very carefully.  . Disability examination    Discussion of disability September, 2013    Past Surgical History  Procedure Laterality Date  . Tee with cardioversion  11/17/08    No LV clot  . Cardiac defibrillator placement  11/11/10    SJM Fortify DR ICD implanted by Dr Johney Frame    There were no vitals filed for this visit.      Subjective Assessment - 05/12/16 1352    Subjective "I had to go to the emergency room for my left ankle and knee."   Patient is accompained by: Family member   Currently in Pain? No/denies   Pain Onset Yesterday               ADULT SLP TREATMENT - 05/12/16 1408    General Information   Behavior/Cognition Alert;Cooperative;Pleasant mood   Patient Positioning Upright in chair   Oral care provided N/A   HPI [name] is a 50 yo male from [place], [place] with a past medical history of atrial fibrillation, CHF, s/p cardiac pacemaker implantation, and gout who developed right hemiplegia and global aphasia on 01/09/2016. He was taken to [facility] where his studies showed left MCA stroke. He received IV TPA at [facility] and was airlifted to [facility] as stroke code. 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 He developed SOB and  was started on BIPAP during that hospitalization. He was transferred to an inpatient rehabilitation facility on 01/15/16, but showed seizure activity on 01/19/16 and was re-hospitalized. He was discharged home from that hospitalization on 01/20/2016. He has not received speech therapy since 01/19/16 unfortunately. He was referred by Dr. Alison Murray for outpatient SLP services due to aphasia from recent stroke. He was accompanied to the appointment by Adam [name], his wife   Treatment Provided   Treatment provided Cognitive-Linquistic   Pain Assessment   Pain Assessment No/denies pain   Cognitive-Linquistic Treatment   Treatment focused on Aphasia;Patient/family/caregiver education   Skilled Treatment pt  education, compensatory strategies, word finding activities, automatic speech drills   Assessment / Recommendations / Plan   Plan Continue with current plan of care   Progression Toward Goals   Progression toward goals Progressing toward goals            SLP Short Term Goals - 05/12/16 1756    SLP SHORT TERM GOAL #1   Title Pt will increase naming of common objects/pictures to 90% acc when provided with min assist  change to 90% acc with min assist   Baseline 72%   Time 8   Period Weeks   Status On-going   SLP SHORT TERM GOAL #2   Title Pt will complete single word sentence completion tasks (SWSC) with 90% acc when provided with mod multimodality cues.   Baseline 70%   Time 8   Period Weeks   Status Achieved   SLP SHORT TERM GOAL #3   Title Pt will increase divergent naming to 5-7 items per concrete category with min cues.  change to 5-7 items with min cues   Baseline 1-2   Time 8   Period Weeks   Status On-going   SLP SHORT TERM GOAL #4   Title Pt will complete basic sentence level reading comprehension tasks with 90% acc with provided mi/mod cues.  change to basic sentence level with multiple choice response with 90% with mi/mod cues   Baseline 25%   Time 8   Period Weeks   Status On-going   SLP SHORT TERM GOAL #5   Title Pt will answer moderate level auditory comprehension yes/no questions with 80% acc when provided mod cues.   Baseline 65%   Time 8   Period Weeks   Status Achieved   SLP SHORT TERM GOAL #6   Title Pt will be able to write personal/bio information with 90% acc when provided mod cues  Continue goal   Baseline name only   Time 8   Period Weeks   Status On-going   SLP SHORT TERM GOAL #7   Title Pt will generate 5-7 word sentence when looking at dynamic picture with 80% acc when provided mod cues.   Baseline 75% acc with mod/max assist   Time 8   Period Weeks   Status On-going   SLP SHORT TERM GOAL #8   Title Pt will use compensatory  strategies (gesture, spell, write, describe) with min prompts on 75% of opportunities in conversations   Baseline mod/max prompts to use total communication strategies   Time 8   Period Weeks   Status On-going          SLP Long Term Goals - 05/12/16 1757    SLP LONG TERM GOAL #1   Title Pt will communicate moderate level wants/needs/thoughts to family and friends with use of multimodality communication strategies as needed.    Baseline mod/severe impairment  Time 3   Period Months   Status On-going          Plan - 05/12/16 1748    Clinical Impression Statement [name] was unable to attend session on Monday due to pain in left ankle and knee (gout). He went to the emergency room at [facility] last Friday. He will see Dr. Sherryll [name] this coming Monday. Session targeted expressive language goals through picture description and divergent naming tasks. He completed concrete divergent naming tasks with 100% acc, but had more difficulty with abstract divergent naming tasks and completed with mod assist from SLP. He was asked to describe a given action (verb) photo in a sentence and completed with 85% acc when he used a repetitive carrier phrase ("He is trying to _____"). SLP then provided specific verbs for him to use which helped break perseveration and generate novel sentences for each picture. He continues to make good progress. SLP will be out of town next week, but therapy will resume the following.    Speech Therapy Frequency 2x / week   Duration --  8 weeks   Treatment/Interventions Language facilitation;Compensatory techniques;SLP instruction and feedback;Cueing hierarchy;Patient/family education;Compensatory strategies;Multimodal communcation approach;Functional tasks   Potential to Achieve Goals Good   Potential Considerations Severity of impairments   SLP Home Exercise Plan Pt will be independent with HEP to facilitate carryover of treatment strategies and techniques in home/community  environment with assist from wife as needed.    Consulted and Agree with Plan of Care Patient;Family member/caregiver   Family Member Consulted Wife, Adam [name]      Patient will benefit from skilled therapeutic intervention in order to improve the following deficits and impairments:   Aphasia  Dyslexia and alexia  Agraphia    Problem List Patient Active Problem List   Diagnosis Date Noted  . Lightheadedness 01/24/2014  . Nonischemic cardiomyopathy (HCC) 10/29/2013  . Chronic systolic CHF (congestive heart failure) (HCC) 04/26/2013  . Chest discomfort 04/26/2013  . Bradycardia 04/26/2013  . Automatic implantable cardioverter-defibrillator in situ 09/04/2012  . Hypokalemia   . Disability examination   . Drug therapy   . Ventricular tachyarrhythmia (HCC) 06/06/2012  . Headache(784.0)   . Sleepiness   . Mitral regurgitation   . Cardiomyopathy (HCC)   . Ejection fraction < 50%   . Renal insufficiency    Thank you,  Adam [name], CCC-SLP [phone]  [facility] 05/12/2016, 5:57 PM  [facility] [address] [place], [place], [address] Phone: [phone]   Fax:  [phone]   Name: Adam [name] MRN: [id number] Date of Birth: [birth year]

## 2016-05-18 ENCOUNTER — Ambulatory Visit (INDEPENDENT_AMBULATORY_CARE_PROVIDER_SITE_OTHER): Payer: Medicare HMO | Admitting: *Deleted

## 2016-05-18 DIAGNOSIS — I429 Cardiomyopathy, unspecified: Secondary | ICD-10-CM

## 2016-05-18 NOTE — Progress Notes (Signed)
Remote ICD transmission.   

## 2016-05-20 ENCOUNTER — Encounter: Payer: Self-pay | Admitting: Cardiology

## 2016-05-20 ENCOUNTER — Telehealth (HOSPITAL_COMMUNITY): Payer: Self-pay

## 2016-05-20 NOTE — Telephone Encounter (Signed)
05/20/16 wanted to come in at a later time on 7/17 but nothing available - he has another appt

## 2016-05-23 ENCOUNTER — Encounter (HOSPITAL_COMMUNITY): Payer: Medicare HMO | Admitting: Speech Pathology

## 2016-05-24 ENCOUNTER — Encounter: Payer: Self-pay | Admitting: Internal Medicine

## 2016-05-24 LAB — CUP PACEART REMOTE DEVICE CHECK
Battery Remaining Longevity: 54 mo
Battery Remaining Percentage: 47 %
Brady Statistic AP VS Percent: 4.3 %
Brady Statistic AS VS Percent: 94 %
Brady Statistic RA Percent Paced: 3.9 %
Date Time Interrogation Session: 20170712071836
HIGH POWER IMPEDANCE MEASURED VALUE: 60 Ohm
HIGH POWER IMPEDANCE MEASURED VALUE: 60 Ohm
Implantable Lead Implant Date: 20120105
Implantable Lead Implant Date: 20120105
Implantable Lead Location: 753859
Lead Channel Impedance Value: 380 Ohm
Lead Channel Pacing Threshold Amplitude: 0.75 V
Lead Channel Pacing Threshold Pulse Width: 0.4 ms
Lead Channel Sensing Intrinsic Amplitude: 3 mV
Lead Channel Setting Pacing Pulse Width: 0.4 ms
Lead Channel Setting Sensing Sensitivity: 0.5 mV
MDC IDC LEAD LOCATION: 753860
MDC IDC LEAD MODEL: 7122
MDC IDC MSMT BATTERY VOLTAGE: 2.92 V
MDC IDC MSMT LEADCHNL RA IMPEDANCE VALUE: 390 Ohm
MDC IDC MSMT LEADCHNL RA PACING THRESHOLD AMPLITUDE: 1 V
MDC IDC MSMT LEADCHNL RV PACING THRESHOLD PULSEWIDTH: 0.4 ms
MDC IDC MSMT LEADCHNL RV SENSING INTR AMPL: 11.1 mV
MDC IDC PG SERIAL: 622431
MDC IDC SET LEADCHNL RA PACING AMPLITUDE: 2 V
MDC IDC SET LEADCHNL RV PACING AMPLITUDE: 2.5 V
MDC IDC STAT BRADY AP VP PERCENT: 1 %
MDC IDC STAT BRADY AS VP PERCENT: 1 %
MDC IDC STAT BRADY RV PERCENT PACED: 1 %

## 2016-05-26 ENCOUNTER — Telehealth (HOSPITAL_COMMUNITY): Payer: Self-pay

## 2016-05-26 ENCOUNTER — Ambulatory Visit (HOSPITAL_COMMUNITY): Payer: Medicare HMO | Admitting: Speech Pathology

## 2016-05-26 NOTE — Telephone Encounter (Signed)
05/26/16 he is sick and has a drs appt this morning

## 2016-05-30 ENCOUNTER — Encounter (HOSPITAL_COMMUNITY): Payer: Self-pay | Admitting: Speech Pathology

## 2016-05-30 ENCOUNTER — Ambulatory Visit (HOSPITAL_COMMUNITY): Payer: Medicare HMO | Admitting: Speech Pathology

## 2016-05-30 DIAGNOSIS — R4701 Aphasia: Secondary | ICD-10-CM | POA: Diagnosis not present

## 2016-05-30 DIAGNOSIS — R480 Dyslexia and alexia: Secondary | ICD-10-CM

## 2016-05-30 DIAGNOSIS — R488 Other symbolic dysfunctions: Secondary | ICD-10-CM

## 2016-05-30 NOTE — Therapy (Signed)
[facility] [facility] [address] [place], [place], [address] Phone: [phone]   Fax:  [phone]  Speech Language Pathology Treatment  Patient Details  Name: Adam [name] MRN: [id number] Date of Birth: 1966/01/25 Referring Provider: Alison Murray  Encounter Date: 05/30/2016      End of Session - 05/30/16 1629    Visit Number 26   Number of Visits 36   Date for SLP Re-Evaluation 06/04/16   Authorization Type [employer] Medicare; Medicaid   Authorization Time Period 04/04/2016- 06/04/2016   Authorization - Visit Number 11   Authorization - Number of Visits 14   SLP Start Time 1312   SLP Stop Time  1353   SLP Time Calculation (min) 41 min   Activity Tolerance Patient tolerated treatment well      Past Medical History:  Diagnosis Date  . Chronic systolic heart failure (HCC)   . Disability examination    Discussion of disability September, 2013  . Drug therapy    Sotalol started for VT storm  July, 2013  . Ejection fraction < 50%    Echo 06/05/12: Mild LVH, EF 20%, posterior severe HK, anterolateral severe HK, anterior severe HK, mid to apical septal AK, AK of the true apex, apical inferior HK, restrictive physiology, trivial MR, moderate LAE, mildly reduced RV function, mild TR.  Marland Kitchen Headache(784.0)    Patient seen to have headaches from spironolactone.  Drug was stopped, August, 2012  . Hypokalemia    There was some decrease in potassium with his admission in July, 2013. We will watch this very carefully.  . ICD (implantable cardiac defibrillator) battery depletion 11/11/10   St. Jude, Dr.Allred  . 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 Mitral regurgitation   . NICM (nonischemic cardiomyopathy) (HCC)    Nonischemic / catheterization October, 2011, normal coronary arteries  . Renal insufficiency    Creatinine 1.5 (GFR greater than 50)  . Sleepiness    has some sleepiness during the day  . VT (ventricular tachycardia) (HCC)     VT storm 7/13 - Sotalol started    Past Surgical History:  Procedure Laterality Date  . CARDIAC DEFIBRILLATOR PLACEMENT  11/11/10   SJM Fortify DR ICD implanted by Dr Johney Frame  . TEE WITH CARDIOVERSION  11/17/08   No LV clot    There were no vitals filed for this visit.      Subjective Assessment - 05/30/16 1624    Subjective "Now I have gout in my arm."   Patient is accompained by: Family member   Pain Score 0-No pain               ADULT SLP TREATMENT - 05/30/16 0001      General Information   Behavior/Cognition Alert;Cooperative;Pleasant mood   Patient Positioning Upright in chair   Oral care provided N/A   HPI Mr. Burley Sabey is a 50 yo male from [place], [place] with a past medical history of atrial fibrillation, CHF, s/p cardiac pacemaker implantation, and gout who developed right hemiplegia and global aphasia on 01/09/2016. He was taken to [facility] where his studies showed left MCA stroke. He received IV TPA at [facility] and was airlifted to [facility] as stroke code. 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 He developed SOB and was started on BIPAP during that hospitalization. He was transferred to an inpatient rehabilitation facility on 01/15/16, but showed seizure  activity on 01/19/16 and was re-hospitalized. He was discharged home from that hospitalization on 01/20/2016. He has not received speech therapy since 01/19/16 unfortunately. He was referred by Dr. Alison Murray for outpatient SLP services due to aphasia from recent stroke. He was accompanied to the appointment by [name], his wife     Treatment Provided   Treatment provided Cognitive-Linquistic     Pain Assessment   Pain Assessment No/denies pain     Cognitive-Linquistic Treatment   Treatment focused on Aphasia;Patient/family/caregiver education   Skilled Treatment pt education, compensatory strategies, word finding activities,  automatic speech drills     Assessment / Recommendations / Plan   Plan Continue with current plan of care     Progression Toward Goals   Progression toward goals Progressing toward goals            SLP Short Term Goals - 05/30/16 1639      SLP SHORT TERM GOAL #1   Title Pt will increase naming of common objects/pictures to 90% acc when provided with min assist  change to 90% acc with min assist   Baseline 72%   Time 8   Period Weeks   Status On-going     SLP SHORT TERM GOAL #2   Title Pt will complete single word sentence completion tasks (SWSC) with 90% acc when provided with mod multimodality cues.   Baseline 70%   Time 8   Period Weeks   Status Achieved     SLP SHORT TERM GOAL #3   Title Pt will increase divergent naming to 5-7 items per concrete category with min cues.  change to 5-7 items with min cues   Baseline 1-2   Time 8   Period Weeks   Status On-going     SLP SHORT TERM GOAL #4   Title Pt will complete basic sentence level reading comprehension tasks with 90% acc with provided mi/mod cues.  change to basic sentence level with multiple choice response with 90% with mi/mod cues   Baseline 25%   Time 8   Period Weeks   Status On-going     SLP SHORT TERM GOAL #5   Title Pt will answer moderate level auditory comprehension yes/no questions with 80% acc when provided mod cues.   Baseline 65%   Time 8   Period Weeks   Status Achieved     SLP SHORT TERM GOAL #6   Title Pt will be able to write personal/bio information with 90% acc when provided mod cues  Continue goal   Baseline name only   Time 8   Period Weeks   Status On-going     SLP SHORT TERM GOAL #7   Title Pt will generate 5-7 word sentence when looking at dynamic picture with 80% acc when provided mod cues.   Baseline 75% acc with mod/max assist   Time 8   Period Weeks   Status On-going     SLP SHORT TERM GOAL #8   Title Pt will use compensatory strategies (gesture, spell, write,  describe) with min prompts on 75% of opportunities in conversations   Baseline mod/max prompts to use total communication strategies   Time 8   Period Weeks   Status On-going          SLP Long Term Goals - 05/30/16 1640      SLP LONG TERM GOAL #1   Title Pt will communicate moderate level wants/needs/thoughts to family and friends with use of multimodality communication strategies  as needed.    Baseline mod/severe impairment   Time 3   Period Months   Status On-going          Plan - 05/30/16 1630    Clinical Impression Statement Mr. Reece missed a few sessions due to gout flare. He has his left arm in a sling today due to gout on his left side. Pt challenged with low frequency picture naming activity which he completed with 85% acc when given moderate cues (phonemic and written) from SLP. He needed reminders to use word finding strategies (describe object, state function, etc.). Once cued to provide description, he did so with 93% acc. He completed naming to description task (general divergent) with 100% acc with indirect cues. Pt's wife reports that pt demonstrating increased ability to verbalize thoughts at home, but also indicates that he still gets frustrated when he appears unable to communicate effectively. He has been in a great deal of pain the past several weeks (gout), so this likely negatively impacts effective communication at times. Continue plan of care.   Speech Therapy Frequency 2x / week   Duration --  8 weeks   Treatment/Interventions Language facilitation;Compensatory techniques;SLP instruction and feedback;Cueing hierarchy;Patient/family education;Compensatory strategies;Multimodal communcation approach;Functional tasks   Potential to Achieve Goals Good   Potential Considerations Severity of impairments   SLP Home Exercise Plan Pt will be independent with HEP to facilitate carryover of treatment strategies and techniques in home/community environment with assist  from wife as needed.    Consulted and Agree with Plan of Care Patient;Family member/caregiver   Family Member Consulted Wife, [name]      Patient will benefit from skilled therapeutic intervention in order to improve the following deficits and impairments:   Dyslexia and alexia  Aphasia  Agraphia    Problem List Patient Active Problem List   Diagnosis Date Noted  . Lightheadedness 01/24/2014  . Nonischemic cardiomyopathy (HCC) 10/29/2013  . Chronic systolic CHF (congestive heart failure) (HCC) 04/26/2013  . Chest discomfort 04/26/2013  . Bradycardia 04/26/2013  . Automatic implantable cardioverter-defibrillator in situ 09/04/2012  . Hypokalemia   . Disability examination   . Drug therapy   . Ventricular tachyarrhythmia (HCC) 06/06/2012  . Headache(784.0)   . Sleepiness   . Mitral regurgitation   . Cardiomyopathy (HCC)   . Ejection fraction < 50%   . Renal insufficiency    Thank you,  Havery Moros, CCC-SLP [phone]  [facility] 05/30/2016, 4:42 PM  [place] [facility] [address] [place], [place], [address] Phone: [phone]   Fax:  [phone]   Name: Lashun Mccants Noffsinger MRN: [id number] Date of Birth: Feb 25, 1966

## 2016-05-31 ENCOUNTER — Encounter: Payer: Self-pay | Admitting: Cardiology

## 2016-05-31 ENCOUNTER — Ambulatory Visit (INDEPENDENT_AMBULATORY_CARE_PROVIDER_SITE_OTHER): Payer: Medicare HMO | Admitting: Cardiology

## 2016-05-31 ENCOUNTER — Encounter: Payer: Self-pay | Admitting: *Deleted

## 2016-05-31 VITALS — BP 97/65 | HR 49 | Ht 70.0 in | Wt 234.8 lb

## 2016-05-31 DIAGNOSIS — E785 Hyperlipidemia, unspecified: Secondary | ICD-10-CM

## 2016-05-31 DIAGNOSIS — I5022 Chronic systolic (congestive) heart failure: Secondary | ICD-10-CM

## 2016-05-31 DIAGNOSIS — I472 Ventricular tachycardia, unspecified: Secondary | ICD-10-CM

## 2016-05-31 DIAGNOSIS — I4892 Unspecified atrial flutter: Secondary | ICD-10-CM

## 2016-05-31 NOTE — Patient Instructions (Signed)

## 2016-05-31 NOTE — Progress Notes (Signed)
Clinical Summary Adam [name] is a 50 y.o.male seen today for follow up of the following medical problems.   1. NICM/Chronic systolic HF 01/2013 echo LVEF 30-35%, cannot eval diastolic dysfunction. - cath 08/2010 patent coronaries - ICD followed by EP. History of VT, ICD indications include secondary prevention.   - no SOB or DOE. No LE edema - compliant with meds, denies any significant side effects  2. History of VT - followed by EP. He is on sotalol, also has ICD - no recent palpitations, no syncope    3. CVA - recent left MCA stroke early 01/09/16, received tPA.Marland Kitchen Readmit later in the month with possible seizure activity.  - from [facility] records discharge summary mentions patient medical history of afib, however I do not see a corresponding diagnosis in the [facility] record. The diagnosis of afib is listed in the intial H&P as well - device check 07/2015 showed 3 minute episode of aflutter   4. Hyperlipidemia - 01/2016 labs at [facility] showed TC 217 HDL 27 TG 128 LDL 164 - recently started on high dose statin during admission - remains compliant with statin  5. Paroxysmal aflutter - denies any palpitations - on eliquis for stroke prevention   Past Medical History:  Diagnosis Date  . Chronic systolic heart failure (HCC)   . Disability examination    Discussion of disability September, 2013  . Drug therapy    Sotalol started for VT storm  July, 2013  . Ejection fraction < 50%    Echo 06/05/12: Mild LVH, EF 20%, posterior severe HK, anterolateral severe HK, anterior severe HK, mid to apical septal AK, AK of the true apex, apical inferior HK, restrictive physiology, trivial MR, moderate LAE, mildly reduced RV function, mild TR.  Marland Kitchen Headache(784.0)    Patient seen to have headaches from spironolactone.  Drug was stopped, August, 2012  . Hypokalemia    There was some decrease in potassium with his admission in July, 2013. We will watch this very carefully.  . ICD  (implantable cardiac defibrillator) battery depletion 11/11/10   St. Jude, Dr.Allred  . 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 Mitral regurgitation   . NICM (nonischemic cardiomyopathy) (HCC)    Nonischemic / catheterization October, 2011, normal coronary arteries  . Renal insufficiency    Creatinine 1.5 (GFR greater than 50)  . Sleepiness    has some sleepiness during the day  . VT (ventricular tachycardia) (HCC)    VT storm 7/13 - Sotalol started     Allergies  Allergen Reactions  . Atorvastatin Other (See Comments)    headaches headaches     Current Outpatient Prescriptions  Medication Sig Dispense Refill  . atorvastatin (LIPITOR) 80 MG tablet Take 80 mg by mouth at bedtime.  0  . benazepril (LOTENSIN) 5 MG tablet TAKE ONE TABLET BY MOUTH ONCE DAILY 30 tablet 6  . carvedilol (COREG) 6.25 MG tablet TAKE ONE TABLET BY MOUTH TWICE DAILY 60 tablet 6  . ELIQUIS 5 MG TABS tablet Take 5 mg by mouth 2 (two) times daily.  0  . esomeprazole (NEXIUM) 40 MG capsule Take 40 mg by mouth 2 (two) times daily.  0  . furosemide (LASIX) 40 MG tablet Take 1 tablet (40 mg total) by mouth 2 (two) times daily. 60 tablet 6  . levETIRAcetam (KEPPRA) 500 MG tablet Take 500 mg by mouth 2 (two) times daily.  0  . sotalol (BETAPACE) 120 MG tablet TAKE ONE TABLET BY  MOUTH EVERY TWELVE HOURS. 60 tablet 6  . STOOL SOFTENER 100 MG capsule Take 100 mg by mouth 2 (two) times daily.  0   No current facility-administered medications for this visit.      Past Surgical History:  Procedure Laterality Date  . CARDIAC DEFIBRILLATOR PLACEMENT  11/11/10   SJM Fortify DR ICD implanted by Dr Johney Frame  . TEE WITH CARDIOVERSION  11/17/08   No LV clot     Allergies  Allergen Reactions  . Atorvastatin Other (See Comments)    headaches headaches      Family History  Problem Relation Age of Onset  . Heart attack Father 75    Multiple MIs  . Stroke Father   . Heart attack Brother   . Heart  failure Brother     CHF  . Hypertension Mother   . Stroke Mother   . Heart attack Paternal Grandfather      Social History Mr. Rosner reports that he has never smoked. He has never used smokeless tobacco. Mr. Howk reports that he drinks alcohol.   Review of Systems CONSTITUTIONAL: No weight loss, fever, chills, weakness or fatigue.  HEENT: Eyes: No visual loss, blurred vision, double vision or yellow sclerae.No hearing loss, sneezing, congestion, runny nose or sore throat.  SKIN: No rash or itching.  CARDIOVASCULAR: per HPI RESPIRATORY: No shortness of breath, cough or sputum.  GASTROINTESTINAL: No anorexia, nausea, vomiting or diarrhea. No abdominal pain or blood.  GENITOURINARY: No burning on urination, no polyuria NEUROLOGICAL: No headache, dizziness, syncope, paralysis, ataxia, numbness or tingling in the extremities. No change in bowel or bladder control.  MUSCULOSKELETAL: No muscle, back pain, joint pain or stiffness.  LYMPHATICS: No enlarged nodes. No history of splenectomy.  PSYCHIATRIC: No history of depression or anxiety.  ENDOCRINOLOGIC: No reports of sweating, cold or heat intolerance. No polyuria or polydipsia.  Marland Kitchen   Physical Examination Vitals:   05/31/16 1126  BP: 97/65  Pulse: (!) 49   Vitals:   05/31/16 1126  Weight: 234 lb 12.8 oz (106.5 kg)  Height: 5\' 10"  (1.778 m)    Gen: resting comfortably, no acute distress HEENT: no scleral icterus, pupils equal round and reactive, no palptable cervical adenopathy,  CV: RRR, no m/r/,g no jvd Resp: Clear to auscultation bilaterally GI: abdomen is soft, non-tender, non-distended, normal bowel sounds, no hepatosplenomegaly MSK: extremities are warm, no edema.  Skin: warm, no rash Neuro:  no focal deficits Psych: appropriate affect   Diagnostic Studies 01/2013 echo Study Conclusions  - Left ventricle: The cavity size was normal. There was moderate concentric hypertrophy with thinning and akinesis of  the posterior wall. LV Apical false tendon. There is posterior akinesis and inferoapical severe hypokinesis. The mid to distal anterior wall and septum is also severely hypokinetic. Systolic function was moderately to severely reduced. The estimated ejection fraction was in the range of 30% to 35%. The study is not technically sufficient to allow evaluation of LV diastolic function. - Aortic valve: Transvalvular velocity was within the normal range. There was no stenosis. Trace to mild central regurgitation. - Mitral valve: Mildly thickened leaflets with minimal, bi-leafletlate systolic prolapse. Mild regurgitation. - Left atrium: LA Volume/ BSA = 30.76ml/m2 The atrium was mildly dilated. - Right ventricle: The cavity size was normal. Wall thickness was normal. AICD wirenoted in right ventricle. Systolic function was normal. - Right atrium: The atrium was normal in size. AICD wire noted in right atrium. - Tricuspid valve: Mild regurgitation. - Inferior vena cava:  The vessel was normal in size; the respirophasic diameter changes were in the normal range (= 50%); findings are consistent with normal central venous pressure.   01/2015 Echo [facility] Interpretation Summary  A complete portable two-dimensional transthoracic echocardiogram with color  flow Doppler and Spectral Doppler was performed. Saline contrast injection  was performed.  The left ventricle is mildly dilated.  There is normal left ventricular wall thickness.  There is akinesis of the inferior, posterior, and lateral walls.  There is moderate diffuse hypokinesis of the remaining left ventricular  segments.  The left ventricular ejection fraction is markedly reduced (25-30%).  The left ventricular diastolic function is normal.  The aortic valve is not well visualized, but is grossly normal.  Injection of contrast documented no interatrial shunt .    01/2015 [facility]  [facility] IMPRESSION:   1. Vasculature is unchanged and without stenosis or an aneurysm or cutoff sign. No hemorrhage.  2. Possible developing hypodensity in the left insular region.  3. Old infarct in the right occipital lobe.  4. Right upper lobe pneumonia.      Assessment and Plan  1. NICM/Chronic systolic HF - no current symptoms. Soft bp's have limited medication titration. He is tolerating just low dose ACE-I, we have not attempted converting to entresto due to this but may consider in the future.  - continue current meds. EKG in clinic shows NSR  2. History of VT - ICD followed by EP, continue regular device checks. On sotalol followed by EP  3. Hyperlipidemia - continue high dose statin s/p CVA - notes indicate lipitor in the past caused headaches, but curretly tolerating. We will continue   4. Paroxysmal aflutter - no current symptoms - CHADS2Vasc score 3, continue eliquis.    F/u 6 months  Antoine Poche, M.D.

## 2016-06-02 ENCOUNTER — Encounter (HOSPITAL_COMMUNITY): Payer: Self-pay | Admitting: Speech Pathology

## 2016-06-02 ENCOUNTER — Ambulatory Visit (HOSPITAL_COMMUNITY): Payer: Medicare HMO | Admitting: Speech Pathology

## 2016-06-02 DIAGNOSIS — R4701 Aphasia: Secondary | ICD-10-CM

## 2016-06-02 DIAGNOSIS — R480 Dyslexia and alexia: Secondary | ICD-10-CM

## 2016-06-02 DIAGNOSIS — R488 Other symbolic dysfunctions: Secondary | ICD-10-CM

## 2016-06-02 NOTE — Therapy (Signed)
[facility] [address] [place], [place], [address] Phone: [phone]   Fax:  [phone]  Speech Language Pathology Treatment  Patient Details  Name: Adam [name] MRN: [id number] Date of Birth: Feb 23, 1966 Referring Provider: Alison Murray  Encounter Date: 06/02/2016      End of Session - 06/02/16 1417    Visit Number 27   Number of Visits 36   Date for SLP Re-Evaluation 06/04/16   Authorization Type [employer]; Medicaid   Authorization Time Period 04/04/2016- 06/04/2016   Authorization - Visit Number 12   Authorization - Number of Visits 14   SLP Start Time 1347   SLP Stop Time  1432   SLP Time Calculation (min) 45 min   Activity Tolerance Patient tolerated treatment well      Past Medical History:  Diagnosis Date  . Chronic systolic heart failure (HCC)   . Disability examination    Discussion of disability September, 2013  . Drug therapy    Sotalol started for VT storm  July, 2013  . Ejection fraction < 50%    Echo 06/05/12: Mild LVH, EF 20%, posterior severe HK, anterolateral severe HK, anterior severe HK, mid to apical septal AK, AK of the true apex, apical inferior HK, restrictive physiology, trivial MR, moderate LAE, mildly reduced RV function, mild TR.  Marland Kitchen Headache(784.0)    Patient seen to have headaches from spironolactone.  Drug was stopped, August, 2012  . Hypokalemia    There was some decrease in potassium with his admission in July, 2013. We will watch this very carefully.  . ICD (implantable cardiac defibrillator) battery depletion 11/11/10   St. Jude, Dr.Allred  . 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 Mitral regurgitation   . NICM (nonischemic cardiomyopathy) (HCC)    Nonischemic / catheterization October, 2011, normal coronary arteries  . Renal insufficiency    Creatinine 1.5 (GFR greater than 50)  . Sleepiness    has some sleepiness during the day  . VT (ventricular tachycardia) (HCC)     VT storm 7/13 - Sotalol started    Past Surgical History:  Procedure Laterality Date  . CARDIAC DEFIBRILLATOR PLACEMENT  11/11/10   SJM Fortify DR ICD implanted by Dr Johney Frame  . TEE WITH CARDIOVERSION  11/17/08   No LV clot    There were no vitals filed for this visit.      Subjective Assessment - 06/02/16 1411    Subjective "It is much better."   Patient is accompained by: Family member               ADULT SLP TREATMENT - 06/02/16 2342      General Information   Behavior/Cognition Alert;Cooperative;Pleasant mood   Patient Positioning Upright in chair   Oral care provided N/A   HPI [name] is a 50 yo male from [place], [place] with a past medical history of atrial fibrillation, CHF, s/p cardiac pacemaker implantation, and gout who developed right hemiplegia and global aphasia on 01/09/2016. He was taken to [facility] where his studies showed left MCA stroke. He received IV TPA at [facility] and was airlifted to [facility] as stroke code. 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 He developed SOB and was started on BIPAP during that hospitalization. He was transferred to an inpatient rehabilitation facility on 01/15/16, but showed seizure activity on 01/19/16 and was re-hospitalized. He was discharged  home from that hospitalization on 01/20/2016. He has not received speech therapy since 01/19/16 unfortunately. He was referred by Dr. Alison Murray for outpatient SLP services due to aphasia from recent stroke. He was accompanied to the appointment by Adam [name], his wife     Treatment Provided   Treatment provided Cognitive-Linquistic     Pain Assessment   Pain Assessment No/denies pain     Cognitive-Linquistic Treatment   Treatment focused on Aphasia;Patient/family/caregiver education   Skilled Treatment pt education, compensatory strategies, word finding activities, automatic speech drills      Assessment / Recommendations / Plan   Plan Continue with current plan of care     Progression Toward Goals   Progression toward goals Progressing toward goals          SLP Education - 06/02/16 2343    Education provided Yes   Education Details Discussed importance of completing HEP; plan for another month of therapy   Person(s) Educated Patient;Spouse   Methods Explanation   Comprehension Verbalized understanding          SLP Short Term Goals - 06/02/16 2345      SLP SHORT TERM GOAL #1   Title Pt will increase naming of common objects/pictures to 90% acc when provided with min assist  change to 90% acc with min assist   Baseline 72%   Time 8   Period Weeks   Status Achieved     SLP SHORT TERM GOAL #2   Title Pt will complete single word sentence completion tasks (SWSC) with 90% acc when provided with mod multimodality cues.   Baseline 70%   Time 8   Period Weeks   Status Achieved     SLP SHORT TERM GOAL #3   Title Pt will increase divergent naming to 5-7 items per concrete category with min cues.  change to 5-7 items with min cues   Baseline 1-2   Time 8   Period Weeks   Status Achieved     SLP SHORT TERM GOAL #4   Title Pt will complete basic sentence level reading comprehension tasks with 90% acc with provided mi/mod cues.  change to basic sentence level with multiple choice response with 90% with mi/mod cues   Baseline 25%   Time 8   Period Weeks   Status Achieved     SLP SHORT TERM GOAL #5   Title Pt will answer moderate level auditory comprehension yes/no questions with 80% acc when provided mod cues.   Baseline 65%   Time 8   Period Weeks   Status Achieved     SLP SHORT TERM GOAL #6   Title Pt will be able to write personal/bio information with 90% acc when provided mod cues  Continue goal   Baseline name only   Time 8   Period Weeks   Status Achieved     SLP SHORT TERM GOAL #7   Title Pt will generate 5-7 word sentence when looking at  dynamic picture with 80% acc when provided mod cues.   Baseline 75% acc with mod/max assist   Time 8   Period Weeks   Status Achieved     SLP SHORT TERM GOAL #8   Title Pt will use compensatory strategies (gesture, spell, write, describe) with min prompts on 75% of opportunities in conversations   Baseline mod/max prompts to use total communication strategies   Time 8   Period Weeks   Status Achieved  SLP Long Term Goals - 06/02/16 2346      SLP LONG TERM GOAL #1   Title Pt will communicate moderate level wants/needs/thoughts to family and friends with use of multimodality communication strategies as needed.    Baseline mod/severe impairment   Time 3   Period Months   Status On-going          Plan - 06/02/16 1418    Clinical Impression Statement [name] was seen for ongoing SLP therapy to address expressive and receptive language deficits. SLP reviewed goals and progress, specifically a dip in progress likely related to pain from gout flare up. Pt reports improved communication at home, but still reports frustration over not being able to freely communicate as he previously did before the stroke. He is eager to drive in about a month pending MD approval. He completed specific divergent naming tasks with 92% acc with min cues. He was reminded to use word finding strategies when he is "stuck". He continues to make good progress toward goals and is motivated to continue with therapy. Recommend another 8 sessions with a break due to staffing. Pt in agreement with plan of care.    Speech Therapy Frequency 2x / week   Duration --  8 weeks   Treatment/Interventions Language facilitation;Compensatory techniques;SLP instruction and feedback;Cueing hierarchy;Patient/family education;Compensatory strategies;Multimodal communcation approach;Functional tasks   Potential to Achieve Goals Good   Potential Considerations Severity of impairments   SLP Home Exercise Plan Pt will be  independent with HEP to facilitate carryover of treatment strategies and techniques in home/community environment with assist from wife as needed.    Consulted and Agree with Plan of Care Patient;Family member/caregiver   Family Member Consulted Wife, Adam [name]      Patient will benefit from skilled therapeutic intervention in order to improve the following deficits and impairments:   Aphasia  Dyslexia and alexia  Agraphia    Problem List Patient Active Problem List   Diagnosis Date Noted  . Lightheadedness 01/24/2014  . Nonischemic cardiomyopathy (HCC) 10/29/2013  . Chronic systolic CHF (congestive heart failure) (HCC) 04/26/2013  . Chest discomfort 04/26/2013  . Bradycardia 04/26/2013  . Automatic implantable cardioverter-defibrillator in situ 09/04/2012  . Hypokalemia   . Disability examination   . Drug therapy   . Ventricular tachyarrhythmia (HCC) 06/06/2012  . Headache(784.0)   . Sleepiness   . Mitral regurgitation   . Cardiomyopathy (HCC)   . Ejection fraction < 50%   . Renal insufficiency    Thank you,  Havery Moros, CCC-SLP [phone]  [facility] 06/02/2016, 11:46 PM  [facility] [address] [place], [place], [address] Phone: [phone]   Fax:  [phone]   Name: Adam [name] MRN: [id number] Date of Birth: May 10, 1966

## 2016-06-06 ENCOUNTER — Ambulatory Visit (HOSPITAL_COMMUNITY): Payer: Medicare HMO | Admitting: Speech Pathology

## 2016-06-06 DIAGNOSIS — R488 Other symbolic dysfunctions: Secondary | ICD-10-CM

## 2016-06-06 DIAGNOSIS — R480 Dyslexia and alexia: Secondary | ICD-10-CM

## 2016-06-06 DIAGNOSIS — R4701 Aphasia: Secondary | ICD-10-CM

## 2016-06-06 NOTE — Therapy (Signed)
[facility] [address] [place], [place], [address] Phone: [phone]   Fax:  [phone]  Speech Language Pathology Treatment  Patient Details  Name: Adam [name] MRN: [id number] Date of Birth: 04-Feb-1966 Referring Provider: Alison Murray  Encounter Date: 06/06/2016      End of Session - 06/06/16 1713    Visit Number 28   Number of Visits 36   Date for SLP Re-Evaluation 06/04/16   Authorization Type [employer] Medicare; Medicaid   Authorization Time Period  06/04/2016   Authorization - Visit Number 1   Authorization - Number of Visits 8   SLP Start Time 1300   SLP Stop Time  1345   SLP Time Calculation (min) 45 min   Activity Tolerance Patient tolerated treatment well      Past Medical History:  Diagnosis Date  . Chronic systolic heart failure (HCC)   . Disability examination    Discussion of disability September, 2013  . Drug therapy    Sotalol started for VT storm  July, 2013  . Ejection fraction < 50%    Echo 06/05/12: Mild LVH, EF 20%, posterior severe HK, anterolateral severe HK, anterior severe HK, mid to apical septal AK, AK of the true apex, apical inferior HK, restrictive physiology, trivial MR, moderate LAE, mildly reduced RV function, mild TR.  Marland Kitchen Headache(784.0)    Patient seen to have headaches from spironolactone.  Drug was stopped, August, 2012  . Hypokalemia    There was some decrease in potassium with his admission in July, 2013. We will watch this very carefully.  . ICD (implantable cardiac defibrillator) battery depletion 11/11/10   St. Jude, Dr.Allred  . 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 Mitral regurgitation   . NICM (nonischemic cardiomyopathy) (HCC)    Nonischemic / catheterization October, 2011, normal coronary arteries  . Renal insufficiency    Creatinine 1.5 (GFR greater than 50)  . Sleepiness    has some sleepiness during the day  . VT (ventricular tachycardia) (HCC)    VT storm  7/13 - Sotalol started    Past Surgical History:  Procedure Laterality Date  . CARDIAC DEFIBRILLATOR PLACEMENT  11/11/10   SJM Fortify DR ICD implanted by Dr Johney Frame  . TEE WITH CARDIOVERSION  11/17/08   No LV clot    There were no vitals filed for this visit.      Subjective Assessment - 06/06/16 1329    Subjective "I went to a birthday party over the weekend."   Currently in Pain? No/denies           SLP Short Term Goals - 06/06/16 1714      SLP SHORT TERM GOAL #1   Title Pt will increase naming of low frequency objects/pictures to 85% acc when provided with min assist   Baseline 78%   Time 4   Period Weeks   Status On-going     SLP SHORT TERM GOAL #2   Title Pt will complete single word sentence completion tasks [facility]) with 90% acc when provided with mod multimodality cues.   Baseline 70%   Time 8   Period Weeks   Status Achieved     SLP SHORT TERM GOAL #3   Title Pt will complete high-level/abstract divergent naming tasks wtih 90% acc with mi/mod cues.  change to 5-7 items with min cues   Baseline 75% mod assist   Time 4   Period Weeks   Status On-going  SLP SHORT TERM GOAL #4   Title Pt will complete basic sentence level reading comprehension tasks with 95% acc with provided min cues.  updated 7/27   Baseline 25%   Time 4   Period Weeks   Status On-going     SLP SHORT TERM GOAL #5   Title Pt will answer moderate level auditory comprehension yes/no questions with 80% acc when provided mod cues.   Baseline 65%   Time 8   Period Weeks   Status Achieved     SLP SHORT TERM GOAL #6   Title Pt will be able to write personal/bio information with 90% acc when provided mod cues  Continue goal   Baseline name only   Time 8   Period Weeks   Status On-going     SLP SHORT TERM GOAL #7   Title Pt will generate 5-7 word sentence when looking at dynamic picture with 90% acc when provided mod cues.   Baseline 75% acc with mod/max assist   Time 8   Period  Weeks   Status On-going     SLP SHORT TERM GOAL #8   Title Pt will use compensatory strategies (gesture, spell, write, describe) with min prompts on 90% of opportunities in conversations  updated 7/27   Baseline mod/max prompts to use total communication strategies   Time 8   Period Weeks   Status On-going          SLP Long Term Goals - 06/02/16 2346      SLP LONG TERM GOAL #1   Title Pt will communicate moderate level wants/needs/thoughts to family and friends with use of multimodality communication strategies as needed.    Baseline mod/severe impairment   Time 3   Period Months   Status On-going          Plan - 06/06/16 1713    Clinical Impression Statement Pt was alert and cooperative for therapy today. He brought his notebook, but had only completed one page from a packet given previously. He completed naming task (matching products to brand names) with 100% acc. He completed verbal naming to description task with 83% acc with min cues. He was able to name 10 car parts with min verbal cues from SLP (cued to visualize and associate). [name] was able to verbally tell SLP how to change a tire with mi/mod cues for accurate labeling of parts. He completed basic level sentence reading comprehension task with 100% acc with mi/mod assist. Continue POC.    Speech Therapy Frequency 2x / week   Duration --  8 weeks   Treatment/Interventions Language facilitation;Compensatory techniques;SLP instruction and feedback;Cueing hierarchy;Patient/family education;Compensatory strategies;Multimodal communcation approach;Functional tasks   Potential to Achieve Goals Good   Potential Considerations Severity of impairments   SLP Home Exercise Plan Pt will be independent with HEP to facilitate carryover of treatment strategies and techniques in home/community environment with assist from wife as needed.    Consulted and Agree with Plan of Care Patient;Family member/caregiver   Family Member  Consulted Wife, [name]      Patient will benefit from skilled therapeutic intervention in order to improve the following deficits and impairments:   Aphasia  Dyslexia and alexia  Agraphia    Problem List Patient Active Problem List   Diagnosis Date Noted  . Lightheadedness 01/24/2014  . Nonischemic cardiomyopathy (HCC) 10/29/2013  . Chronic systolic CHF (congestive heart failure) (HCC) 04/26/2013  . Chest discomfort 04/26/2013  . Bradycardia 04/26/2013  . Automatic implantable  cardioverter-defibrillator in situ 09/04/2012  . Hypokalemia   . Disability examination   . Drug therapy   . Ventricular tachyarrhythmia (HCC) 06/06/2012  . Headache(784.0)   . Sleepiness   . Mitral regurgitation   . Cardiomyopathy (HCC)   . Ejection fraction < 50%   . Renal insufficiency    Thank you,  Havery Moros, CCC-SLP [phone]  [facility] 06/06/2016, 5:15 PM  [facility] [address] [place], [place], [address] Phone: [phone]   Fax:  [phone]   Name: Adam [name] MRN: [id number] Date of Birth: 07-11-1966

## 2016-06-09 ENCOUNTER — Encounter (HOSPITAL_COMMUNITY): Payer: Self-pay | Admitting: Speech Pathology

## 2016-06-09 ENCOUNTER — Ambulatory Visit (HOSPITAL_COMMUNITY): Payer: Medicare HMO | Attending: Internal Medicine | Admitting: Speech Pathology

## 2016-06-09 DIAGNOSIS — R4701 Aphasia: Secondary | ICD-10-CM | POA: Diagnosis not present

## 2016-06-09 DIAGNOSIS — R480 Dyslexia and alexia: Secondary | ICD-10-CM | POA: Insufficient documentation

## 2016-06-09 DIAGNOSIS — R488 Other symbolic dysfunctions: Secondary | ICD-10-CM | POA: Diagnosis present

## 2016-06-09 NOTE — Therapy (Signed)
[facility] [address] [place], [place], [address] Phone: [phone]   Fax:  [phone]  Speech Language Pathology Treatment  Patient Details  Name: Adam [name] MRN: [id number] Date of Birth: 03/06/1966 Referring Provider: Alison Murray  Encounter Date: 06/09/2016      End of Session - 06/09/16 1431    Visit Number 29   Number of Visits 36   Date for SLP Re-Evaluation 07/21/16   Authorization Type [employer] Medicare; Medicaid   Authorization Time Period  06/04/2016- 07/21/2106   Authorization - Visit Number 2   Authorization - Number of Visits 8   SLP Start Time 1350   SLP Stop Time  1435   SLP Time Calculation (min) 45 min   Activity Tolerance Patient tolerated treatment well      Past Medical History:  Diagnosis Date  . Chronic systolic heart failure (HCC)   . Disability examination    Discussion of disability September, 2013  . Drug therapy    Sotalol started for VT storm  July, 2013  . Ejection fraction < 50%    Echo 06/05/12: Mild LVH, EF 20%, posterior severe HK, anterolateral severe HK, anterior severe HK, mid to apical septal AK, AK of the true apex, apical inferior HK, restrictive physiology, trivial MR, moderate LAE, mildly reduced RV function, mild TR.  Marland Kitchen Headache(784.0)    Patient seen to have headaches from spironolactone.  Drug was stopped, August, 2012  . Hypokalemia    There was some decrease in potassium with his admission in July, 2013. We will watch this very carefully.  . ICD (implantable cardiac defibrillator) battery depletion 11/11/10   St. Jude, Dr.Allred  . 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 Mitral regurgitation   . NICM (nonischemic cardiomyopathy) (HCC)    Nonischemic / catheterization October, 2011, normal coronary arteries  . Renal insufficiency    Creatinine 1.5 (GFR greater than 50)  . Sleepiness    has some sleepiness during the day  . VT (ventricular tachycardia) (HCC)    VT storm 7/13 - Sotalol started    Past Surgical History:  Procedure Laterality Date  . CARDIAC DEFIBRILLATOR PLACEMENT  11/11/10   SJM Fortify DR ICD implanted by Dr Johney Frame  . TEE WITH CARDIOVERSION  11/17/08   No LV clot    There were no vitals filed for this visit.      Subjective Assessment - 06/09/16 1427    Subjective "I think the gout is coming back."   Currently in Pain? No/denies               ADULT SLP TREATMENT - 06/09/16 1427      General Information   Behavior/Cognition Alert;Cooperative;Pleasant mood   Patient Positioning Upright in chair   Oral care provided N/A   HPI Adam [name] is a 50 yo male from [place], [place] with a past medical history of atrial fibrillation, CHF, s/p cardiac pacemaker implantation, and gout who developed right hemiplegia and global aphasia on 01/09/2016. He was taken to [facility] where his studies showed left MCA stroke. He received IV TPA at [facility] and was airlifted to [facility] as stroke code. 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 He developed SOB and was started on BIPAP during that hospitalization. He was transferred to an inpatient rehabilitation facility on 01/15/16, but showed seizure activity on 01/19/16 and was re-hospitalized. He was  discharged home from that hospitalization on 01/20/2016. He has not received speech therapy since 01/19/16 unfortunately. He was referred by Dr. Alison Murray for outpatient SLP services due to aphasia from recent stroke. He was accompanied to the appointment by [name], his wife     Treatment Provided   Treatment provided Cognitive-Linquistic     Pain Assessment   Pain Assessment No/denies pain     Cognitive-Linquistic Treatment   Treatment focused on Aphasia;Patient/family/caregiver education   Skilled Treatment pt education, compensatory strategies, word finding activities, automatic speech drills      Assessment / Recommendations / Plan   Plan Continue with current plan of care     Progression Toward Goals   Progression toward goals Progressing toward goals            SLP Short Term Goals - 06/09/16 1805      SLP SHORT TERM GOAL #1   Title Pt will increase naming of low frequency objects/pictures to 85% acc when provided with min assist   Baseline 78%   Time 4   Period Weeks   Status On-going     SLP SHORT TERM GOAL #2   Title Pt will complete single word sentence completion tasks (SWSC) with 90% acc when provided with mod multimodality cues.   Baseline 70%   Time 8   Period Weeks   Status Achieved     SLP SHORT TERM GOAL #3   Title Pt will complete high-level/abstract divergent naming tasks wtih 90% acc with mi/mod cues.  change to 5-7 items with min cues   Baseline 75% mod assist   Time 4   Period Weeks   Status On-going     SLP SHORT TERM GOAL #4   Title Pt will complete basic sentence level reading comprehension tasks with 95% acc with provided min cues.  updated 7/27   Baseline 25%   Time 4   Period Weeks   Status On-going     SLP SHORT TERM GOAL #5   Title Pt will answer moderate level auditory comprehension yes/no questions with 80% acc when provided mod cues.   Baseline 65%   Time 8   Period Weeks   Status Achieved     SLP SHORT TERM GOAL #6   Title Pt will be able to write personal/bio information with 90% acc when provided mod cues  Continue goal   Baseline name only   Time 8   Period Weeks   Status On-going     SLP SHORT TERM GOAL #7   Title Pt will generate 5-7 word sentence when looking at dynamic picture with 90% acc when provided mod cues.   Baseline 75% acc with mod/max assist   Time 8   Period Weeks   Status On-going     SLP SHORT TERM GOAL #8   Title Pt will use compensatory strategies (gesture, spell, write, describe) with min prompts on 90% of opportunities in conversations  updated 7/27   Baseline mod/max prompts to use  total communication strategies   Time 8   Period Weeks   Status On-going          SLP Long Term Goals - 06/09/16 1805      SLP LONG TERM GOAL #1   Title Pt will communicate moderate level wants/needs/thoughts to family and friends with use of multimodality communication strategies as needed.    Baseline mod/severe impairment   Time 3   Period Months   Status On-going  Plan - 06/09/16 1807    Clinical Impression Statement Pt completed homework and reported that he needed some assistance from his wife for reading comprehension of certain words. In session, he completed divergent naming tasks with 93% acc with min cues from SLP (phonemic and sentence completion). He completed convergent naming tasks with 94% acc when provided mi/mod cues. Pt encouraged to complete HEP over the next 2 weeks as he will not be here for therapy (SLP vacation). Continue POC.   Speech Therapy Frequency 2x / week   Duration --  8 weeks   Treatment/Interventions Language facilitation;Compensatory techniques;SLP instruction and feedback;Cueing hierarchy;Patient/family education;Compensatory strategies;Multimodal communcation approach;Functional tasks   Potential to Achieve Goals Good   Potential Considerations Severity of impairments   SLP Home Exercise Plan Pt will be independent with HEP to facilitate carryover of treatment strategies and techniques in home/community environment with assist from wife as needed.    Consulted and Agree with Plan of Care Patient;Family member/caregiver   Family Member Consulted Wife, [name]      Patient will benefit from skilled therapeutic intervention in order to improve the following deficits and impairments:   Aphasia  Dyslexia and alexia  Agraphia    Problem List Patient Active Problem List   Diagnosis Date Noted  . Lightheadedness 01/24/2014  . Nonischemic cardiomyopathy (HCC) 10/29/2013  . Chronic systolic CHF (congestive heart failure)  (HCC) 04/26/2013  . Chest discomfort 04/26/2013  . Bradycardia 04/26/2013  . Automatic implantable cardioverter-defibrillator in situ 09/04/2012  . Hypokalemia   . Disability examination   . Drug therapy   . Ventricular tachyarrhythmia (HCC) 06/06/2012  . Headache(784.0)   . Sleepiness   . Mitral regurgitation   . Cardiomyopathy (HCC)   . Ejection fraction < 50%   . Renal insufficiency    Thank you,  Havery Moros, CCC-SLP [phone]  [facility] 06/09/2016, 6:11 PM  [facility] [address] [place], [place], [address] Phone: [phone]   Fax:  [phone]   Name: Johnston Maddocks Standish MRN: [id number] Date of Birth: Mar 14, 1966

## 2016-06-22 ENCOUNTER — Telehealth: Payer: Self-pay | Admitting: Cardiology

## 2016-06-22 NOTE — Telephone Encounter (Signed)
Pt went to [facility] ED this morning for Gout pain and says that ED physician asked him to speak to his cardiologist about pain meds since there were only certain meds they could give to heart pt. Pt was given Lortab 3-325mg  and prednisone. Forwarded to Dr. Wyline Mood

## 2016-06-22 NOTE — Telephone Encounter (Signed)
Gout needs to be managed by his pcp. Though he is limited from taking NSAIDs (ibuprofen etc) primarily because of his heart, that does not mean his heart doctor is to manage his gout. Please have him contact his pcp    Dominga Ferry MD

## 2016-06-22 NOTE — Telephone Encounter (Signed)
Patient was told by [facility] ED to contact our office in reference to his recent Gout issues he has been experiencing.  Was told they could only give certain pain meds due to his cardiac medications

## 2016-06-22 NOTE — Telephone Encounter (Signed)
Pt aware, just wanted cardiologist opinion on pain meds he should avoid. Pt aware that he should avoid NSAIDS.

## 2016-06-27 ENCOUNTER — Ambulatory Visit (HOSPITAL_COMMUNITY): Payer: Medicare HMO | Admitting: Speech Pathology

## 2016-06-27 ENCOUNTER — Ambulatory Visit (INDEPENDENT_AMBULATORY_CARE_PROVIDER_SITE_OTHER): Payer: Medicare HMO | Admitting: Gastroenterology

## 2016-06-27 ENCOUNTER — Encounter: Payer: Self-pay | Admitting: Gastroenterology

## 2016-06-27 DIAGNOSIS — R197 Diarrhea, unspecified: Secondary | ICD-10-CM | POA: Insufficient documentation

## 2016-06-27 DIAGNOSIS — R109 Unspecified abdominal pain: Secondary | ICD-10-CM | POA: Insufficient documentation

## 2016-06-27 DIAGNOSIS — R1084 Generalized abdominal pain: Secondary | ICD-10-CM | POA: Diagnosis not present

## 2016-06-27 DIAGNOSIS — R112 Nausea with vomiting, unspecified: Secondary | ICD-10-CM | POA: Insufficient documentation

## 2016-06-27 DIAGNOSIS — A09 Infectious gastroenteritis and colitis, unspecified: Secondary | ICD-10-CM

## 2016-06-27 NOTE — Progress Notes (Signed)
Primary Care Physician:  Kirstie Peri, MD  Primary Gastroenterologist:  Roetta Sessions, MD   Chief Complaint  Patient presents with  . Abdominal Pain    better now (was having abd pain & vomiting)    HPI:  Adam [name] is a 50 y.o. male here At the request of his PCP for further evaluation of previous abdominal pain, nausea, vomiting, diarrhea. Patient states his symptoms have now resolved. He was kept overnight at [facility] for the symptoms last month. He has symptoms basically for about 3-4 weeks. We have since received records as outlined in this note. Stool studies showed positive for Campylobacter.  Patient currently feels well. No further abdominal pain. Appetite is improved. No constipation or diarrhea. No blood in the stool or melena. Currently on prednisone for gout. Denies a family history of colon cancer or prior colonoscopy. Denies upper GI symptoms such as heartburn, dysphagia. N/v/d. [facility]. Kept overnight.     Current Outpatient Prescriptions  Medication Sig Dispense Refill  . allopurinol (ZYLOPRIM) 300 MG tablet Take 1 tablet by mouth daily.    Marland Kitchen atorvastatin (LIPITOR) 80 MG tablet Take 80 mg by mouth at bedtime.  0  . benazepril (LOTENSIN) 5 MG tablet TAKE ONE TABLET BY MOUTH ONCE DAILY 30 tablet 6  . carvedilol (COREG) 6.25 MG tablet TAKE ONE TABLET BY MOUTH TWICE DAILY 60 tablet 6  . ELIQUIS 5 MG TABS tablet Take 5 mg by mouth 2 (two) times daily.  0  . furosemide (LASIX) 40 MG tablet Take 1 tablet (40 mg total) by mouth 2 (two) times daily. 60 tablet 6  . levETIRAcetam (KEPPRA) 500 MG tablet Take 500 mg by mouth 2 (two) times daily.  0  . sotalol (BETAPACE) 120 MG tablet TAKE ONE TABLET BY MOUTH EVERY TWELVE HOURS. 60 tablet 6  . predniSONE (DELTASONE) 5 MG tablet Take 5 mg by mouth daily.     No current facility-administered medications for this visit.     Allergies as of 06/27/2016 - Review Complete 06/27/2016  Allergen Reaction Noted  .  Atorvastatin Other (See Comments) 06/05/2012    Past Medical History:  Diagnosis Date  . A-fib (HCC)   . Chronic systolic heart failure (HCC)   . CVA (cerebral infarction)   . Disability examination    Discussion of disability September, 2013  . Drug therapy    Sotalol started for VT storm  July, 2013  . Ejection fraction < 50%    Echo 06/05/12: Mild LVH, EF 20%, posterior severe HK, anterolateral severe HK, anterior severe HK, mid to apical septal AK, AK of the true apex, apical inferior HK, restrictive physiology, trivial MR, moderate LAE, mildly reduced RV function, mild TR.  Marland Kitchen Headache(784.0)    Patient seen to have headaches from spironolactone.  Drug was stopped, August, 2012  . Hypokalemia    There was some decrease in potassium with his admission in July, 2013. We will watch this very carefully.  . ICD (implantable cardiac defibrillator) battery depletion 11/11/10   St. Jude, Dr.Allred  . 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 Mitral regurgitation   . NICM (nonischemic cardiomyopathy) (HCC)    Nonischemic / catheterization October, 2011, normal coronary arteries  . Renal insufficiency    Creatinine 1.5 (GFR greater than 50)  . Sleepiness    has some sleepiness during the day  . VT (ventricular tachycardia) (HCC)    VT storm 7/13 - Sotalol started    Past Surgical History:  Procedure Laterality Date  . CARDIAC DEFIBRILLATOR PLACEMENT  11/11/10   SJM Fortify DR ICD implanted by Dr Johney FrameAllred  . TEE WITH CARDIOVERSION  11/17/08   No LV clot    Family History  Problem Relation Age of Onset  . Heart attack Father 5035    Multiple MIs  . Stroke Father   . Heart attack Brother   . Heart failure Brother     CHF  . Hypertension Mother   . Stroke Mother   . Heart attack Paternal Grandfather   . Colon cancer Neg Hx     Social History   Social History  . Marital status: Single    Spouse name: N/A  . Number of children: 5  . Years of education: N/A    Occupational History  . disabled    Social History Main Topics  . Smoking status: Never Smoker  . Smokeless tobacco: Never Used  . Alcohol use 0.0 oz/week     Comment: OCCASIONAL  (NOT HEAVY)  . Drug use: No  . Sexual activity: Not on file   Other Topics Concern  . Not on file   Social History Narrative   Lives in [place], [place] with [place]iance.   No regular exercise.      ROS:  General: Negative for anorexia, weight loss, fever, chills, fatigue, weakness. Eyes: Negative for vision changes.  ENT: Negative for hoarseness, difficulty swallowing , nasal congestion. CV: Negative for chest pain, angina, palpitations, dyspnea on exertion, peripheral edema.  Respiratory: Negative for dyspnea at rest, dyspnea on exertion, cough, sputum, wheezing.  GI: See history of present illness. GU:  Negative for dysuria, hematuria, urinary incontinence, urinary frequency, nocturnal urination.  MS: Negative for joint pain, low back pain.  Derm: Negative for rash or itching.  Neuro: Negative for weakness, abnormal sensation, seizure, frequent headaches, memory loss, confusion.  Psych: Negative for anxiety, depression, suicidal ideation, hallucinations.  Endo: Negative for unusual weight change.  Heme: Negative for bruising or bleeding. Allergy: Negative for rash or hives.    Physical Examination:  BP 104/73   Pulse (!) 48   Temp 97.4 F (36.3 C) (Oral)   Ht 5\' 11"  (1.803 m)   Wt 225 lb 6.4 oz (102.2 kg)   BMI 31.44 kg/m    General: Well-nourished, well-developed in no acute distress.  Head: Normocephalic, atraumatic.   Eyes: Conjunctiva pink, no icterus. Mouth: Oropharyngeal mucosa moist and pink , no lesions erythema or exudate. Neck: Supple without thyromegaly, masses, or lymphadenopathy.  Lungs: Clear to auscultation bilaterally.  Heart: Regular rate and rhythm, no murmurs rubs or gallops.  Abdomen: Bowel sounds are normal, nontender, nondistended, no hepatosplenomegaly or masses, no  abdominal bruits or    hernia , no rebound or guarding.   Rectal: not performed Extremities: No lower extremity edema. No clubbing or deformities.  Neuro: Alert and oriented x 4 , grossly normal neurologically.  Skin: Warm and dry, no rash or jaundice.   Psych: Alert and cooperative, normal mood and affect.  Labs: Labs from 05/29/2016 Creatinine 0.96, BUN 11. White blood cell count 13,400, hemoglobin 11.4, hematocrit 33.3, MCV 87.4, platelets 192,000   Labs from 05/28/2016, total bilirubin 2.3, alkaline phosphatase 38, AST 13.4, ALT 15, albumin 4.2  Stool culture positive for Campylobacter stool antigen. Negative for C. difficile, she gets toxin 1 and 2  Imaging Studies: CT abdomen and [facility] Stable degenerative disc disease in the lower lumbar spine. Liver was unremarkable.  Stomach and bowel unremarkable. Normal appendix.

## 2016-06-27 NOTE — Patient Instructions (Signed)
1. I will review records from [facility] and get back in touch with you with recommendations for colonoscopy. 2. Please call if you have any recurrent symptoms.

## 2016-06-28 ENCOUNTER — Telehealth (HOSPITAL_COMMUNITY): Payer: Self-pay | Admitting: Speech Pathology

## 2016-06-28 NOTE — Telephone Encounter (Signed)
Pt has gout, family wants to know if he will need more than the three visits already scheduled. NF 06/28/16

## 2016-06-29 ENCOUNTER — Telehealth (HOSPITAL_COMMUNITY): Payer: Self-pay | Admitting: Speech Pathology

## 2016-06-29 NOTE — Telephone Encounter (Signed)
Felicia called to cx these apptments due to pt's gout and her grandbaby having surgery. She pland to bring him in on 07/07/16

## 2016-06-30 ENCOUNTER — Encounter (HOSPITAL_COMMUNITY): Payer: Medicare HMO | Admitting: Speech Pathology

## 2016-06-30 ENCOUNTER — Encounter: Payer: Self-pay | Admitting: Gastroenterology

## 2016-06-30 NOTE — Assessment & Plan Note (Signed)
49 year old gentleman hospitalized last month with several week history of nausea/vomiting/diarrhea, abdominal pain. Stool was positive for Campylobacter. Symptoms have subsequently resolved. He has never had a colonoscopy. Would recommend screening colonoscopy at this time. We'll need to hold Eliquis 48 hours before the procedure.  I have discussed the risks, alternatives, benefits with regards to but not limited to the risk of reaction to medication, bleeding, infection, perforation and the patient is agreeable to proceed. Written consent to be obtained.

## 2016-06-30 NOTE — Progress Notes (Addendum)
Please let patient know that I reviewed his records from [facility]. Stool tested positive for Campylobacter which was likely the source of his vomiting/diarrhea/abdominal pain at the time.  Please schedule patient for screening colonoscopy with Dr. Jena Gauss.  HOLD ELIQUIS for 48 hours before.  PATIENT HAS A DEFIBRILLATOR.

## 2016-07-01 ENCOUNTER — Other Ambulatory Visit: Payer: Self-pay

## 2016-07-01 DIAGNOSIS — Z1211 Encounter for screening for malignant neoplasm of colon: Secondary | ICD-10-CM

## 2016-07-01 MED ORDER — NA SULFATE-K SULFATE-MG SULF 17.5-3.13-1.6 GM/177ML PO SOLN: 1.0000 | ORAL | 0 refills | Status: DC

## 2016-07-01 NOTE — Progress Notes (Signed)
cc'ed to pcp °

## 2016-07-01 NOTE — Progress Notes (Signed)
TCS scheduled for 07/13/16 at 12:00 pm. Called and informed [name] of date, time, and instructions.

## 2016-07-04 ENCOUNTER — Encounter (HOSPITAL_COMMUNITY): Payer: Medicare HMO | Admitting: Speech Pathology

## 2016-07-07 ENCOUNTER — Ambulatory Visit (HOSPITAL_COMMUNITY): Payer: Medicare HMO | Admitting: Speech Pathology

## 2016-07-07 ENCOUNTER — Encounter (HOSPITAL_COMMUNITY): Payer: Self-pay | Admitting: Speech Pathology

## 2016-07-07 DIAGNOSIS — R4701 Aphasia: Secondary | ICD-10-CM | POA: Diagnosis not present

## 2016-07-07 DIAGNOSIS — R488 Other symbolic dysfunctions: Secondary | ICD-10-CM

## 2016-07-07 DIAGNOSIS — R480 Dyslexia and alexia: Secondary | ICD-10-CM

## 2016-07-07 NOTE — Therapy (Signed)
[facility] [address] [place], [place], [address] Phone: [phone]   Fax:  [phone]  Speech Language Pathology Treatment  Patient Details  Name: Adam [name] MRN: [id number] Date of Birth: April 15, 1966 Referring Provider: Alison Murray  Encounter Date: 07/07/2016      End of Session - 07/07/16 1751    Visit Number 30   Number of Visits 36   Date for SLP Re-Evaluation 07/21/16   Authorization Type [employer]; Medicaid   Authorization Time Period  06/04/2016- 07/21/2106   Authorization - Visit Number 3   Authorization - Number of Visits 8   SLP Start Time 1345   SLP Stop Time  1430   SLP Time Calculation (min) 45 min   Activity Tolerance Patient tolerated treatment well      Past Medical History:  Diagnosis Date  . A-fib (HCC)   . Chronic systolic heart failure (HCC)   . CVA (cerebral infarction)   . Disability examination    Discussion of disability September, 2013  . Drug therapy    Sotalol started for VT storm  July, 2013  . Ejection fraction < 50%    Echo 06/05/12: Mild LVH, EF 20%, posterior severe HK, anterolateral severe HK, anterior severe HK, mid to apical septal AK, AK of the true apex, apical inferior HK, restrictive physiology, trivial MR, moderate LAE, mildly reduced RV function, mild TR.  Marland Kitchen Headache(784.0)    Patient seen to have headaches from spironolactone.  Drug was stopped, August, 2012  . Hypokalemia    There was some decrease in potassium with his admission in July, 2013. We will watch this very carefully.  . ICD (implantable cardiac defibrillator) battery depletion 11/11/10   St. Jude, Dr.Allred  . 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 Mitral regurgitation   . NICM (nonischemic cardiomyopathy) (HCC)    Nonischemic / catheterization October, 2011, normal coronary arteries  . Renal insufficiency    Creatinine 1.5 (GFR greater than 50)  . Sleepiness    has some sleepiness during  the day  . VT (ventricular tachycardia) (HCC)    VT storm 7/13 - Sotalol started    Past Surgical History:  Procedure Laterality Date  . CARDIAC DEFIBRILLATOR PLACEMENT  11/11/10   SJM Fortify DR ICD implanted by Dr Johney Frame  . TEE WITH CARDIOVERSION  11/17/08   No LV clot    There were no vitals filed for this visit.      Subjective Assessment - 07/07/16 1750    Subjective "I tried drinking cherry juice." (for gout)               ADULT SLP TREATMENT - 07/07/16 1750      General Information   Behavior/Cognition Alert;Cooperative;Pleasant mood   Patient Positioning Upright in chair   Oral care provided N/A   HPI [name] is a 50 yo male from [place], [place] with a past medical history of atrial fibrillation, CHF, s/p cardiac pacemaker implantation, and gout who developed right hemiplegia and global aphasia on 01/09/2016. He was taken to [facility] where his studies showed left MCA stroke. He received IV TPA at [facility] and was airlifted to [facility] as stroke code. 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 He developed SOB and was started on BIPAP during that hospitalization. He was transferred to an inpatient rehabilitation facility on 01/15/16, but showed seizure activity on  01/19/16 and was re-hospitalized. He was discharged home from that hospitalization on 01/20/2016. He has not received speech therapy since 01/19/16 unfortunately. He was referred by Dr. Alison MurrayJames McLean for outpatient SLP services due to aphasia from recent stroke. He was accompanied to the appointment by Adam ForsterFelicia Watkins, his wife     Treatment Provided   Treatment provided Cognitive-Linquistic     Pain Assessment   Pain Assessment No/denies pain     Cognitive-Linquistic Treatment   Treatment focused on Aphasia;Patient/family/caregiver education   Skilled Treatment pt education, compensatory strategies, word finding activities,  automatic speech drills     Assessment / Recommendations / Plan   Plan Continue with current plan of care            SLP Short Term Goals - 07/07/16 1752      SLP SHORT TERM GOAL #1   Title Pt will increase naming of low frequency objects/pictures to 85% acc when provided with min assist   Baseline 78%   Time 4   Period Weeks   Status On-going     SLP SHORT TERM GOAL #2   Title Pt will complete single word sentence completion tasks [facility] with 90% acc when provided with mod multimodality cues.   Baseline 70%   Time 8   Period Weeks   Status Achieved     SLP SHORT TERM GOAL #3   Title Pt will complete high-level/abstract divergent naming tasks wtih 90% acc with mi/mod cues.  change to 5-7 items with min cues   Baseline 75% mod assist   Time 4   Period Weeks   Status On-going     SLP SHORT TERM GOAL #4   Title Pt will complete basic sentence level reading comprehension tasks with 95% acc with provided min cues.  updated 7/27   Baseline 25%   Time 4   Period Weeks   Status On-going     SLP SHORT TERM GOAL #5   Title Pt will answer moderate level auditory comprehension yes/no questions with 80% acc when provided mod cues.   Baseline 65%   Time 8   Period Weeks   Status Achieved     SLP SHORT TERM GOAL #6   Title Pt will be able to write personal/bio information with 90% acc when provided mod cues  Continue goal   Baseline name only   Time 8   Period Weeks   Status On-going     SLP SHORT TERM GOAL #7   Title Pt will generate 5-7 word sentence when looking at dynamic picture with 90% acc when provided mod cues.   Baseline 75% acc with mod/max assist   Time 8   Period Weeks   Status On-going     SLP SHORT TERM GOAL #8   Title Pt will use compensatory strategies (gesture, spell, write, describe) with min prompts on 90% of opportunities in conversations  updated 7/27   Baseline mod/max prompts to use total communication strategies   Time 8   Period Weeks    Status On-going          SLP Long Term Goals - 07/07/16 1752      SLP LONG TERM GOAL #1   Title Pt will communicate moderate level wants/needs/thoughts to family and friends with use of multimodality communication strategies as needed.    Baseline mod/severe impairment   Time 3   Period Months   Status On-going          Plan - 07/07/16  1752    Clinical Impression Statement [name] has been suffering from gout flare ups and was unable to attend therapy last week. He is feeling better today. In session, he completed divergent naming tasks with 95% acc with min cues from SLP. He completed barrier picture description task with 88% acc and SLP supplied cues for clarification when needed and sentence completion for using appropriate adjectives. Continue POC.   Speech Therapy Frequency 2x / week   Duration --  8 weeks   Treatment/Interventions Language facilitation;Compensatory techniques;SLP instruction and feedback;Cueing hierarchy;Patient/family education;Compensatory strategies;Multimodal communcation approach;Functional tasks   Potential to Achieve Goals Good   Potential Considerations Severity of impairments   SLP Home Exercise Plan Pt will be independent with HEP to facilitate carryover of treatment strategies and techniques in home/community environment with assist from wife as needed.    Consulted and Agree with Plan of Care Patient;Family member/caregiver   Family Member Consulted Wife, Adam [name]      Patient will benefit from skilled therapeutic intervention in order to improve the following deficits and impairments:   Aphasia  Dyslexia and alexia  Agraphia      G-Codes - 08/02/2016 1753    Functional Assessment Tool Used clinical judgment   Functional Limitations Spoken language expressive   Spoken Language Expression Current Status [phone]) At least 20 percent but less than 40 percent impaired, limited or restricted   Spoken Language Expression Goal Status  (U0454) At least 1 percent but less than 20 percent impaired, limited or restricted      Problem List Patient Active Problem List   Diagnosis Date Noted  . Abdominal pain 06/27/2016  . Nausea with vomiting 06/27/2016  . Diarrhea 06/27/2016  . Lightheadedness 01/24/2014  . Nonischemic cardiomyopathy (HCC) 10/29/2013  . Chronic systolic CHF (congestive heart failure) (HCC) 04/26/2013  . Chest discomfort 04/26/2013  . Bradycardia 04/26/2013  . Automatic implantable cardioverter-defibrillator in situ 09/04/2012  . Hypokalemia   . Disability examination   . Drug therapy   . Ventricular tachyarrhythmia (HCC) 06/06/2012  . Headache(784.0)   . Sleepiness   . Mitral regurgitation   . Cardiomyopathy (HCC)   . Ejection fraction < 50%   . Renal insufficiency    Thank you,  Havery Moros, CCC-SLP [phone]  [facility] 2016-08-02, 5:54 PM  [facility] [address] [place], [place], [address] Phone: [phone]   Fax:  [phone]   Name: Adam [name] MRN: [id number] Date of Birth: [birth year]

## 2016-07-12 ENCOUNTER — Ambulatory Visit (HOSPITAL_COMMUNITY): Payer: Medicare HMO | Admitting: Speech Pathology

## 2016-07-12 ENCOUNTER — Telehealth (HOSPITAL_COMMUNITY): Payer: Self-pay | Admitting: Speech Pathology

## 2016-07-12 NOTE — Telephone Encounter (Signed)
Speech Pathology  SLP called Pt due to missed appointment this date. Pt's wife stated that Pt attempted to call and cancel the appointment this AM. Pt plans to be here for his next appointment.  Thank you,  Havery Moros, CCC-SLP [phone]

## 2016-07-13 ENCOUNTER — Ambulatory Visit (HOSPITAL_COMMUNITY)
Admission: RE | Admit: 2016-07-13 | Discharge: 2016-07-13 | Disposition: A | Discharge status: Home / Self Care | Payer: Medicare HMO | Source: Ambulatory Visit | Attending: Internal Medicine | Admitting: Internal Medicine

## 2016-07-13 ENCOUNTER — Encounter (HOSPITAL_COMMUNITY)
Admission: RE | Disposition: A | Discharge status: Home / Self Care | Payer: Self-pay | Source: Ambulatory Visit | Attending: Internal Medicine

## 2016-07-13 ENCOUNTER — Encounter (HOSPITAL_COMMUNITY): Payer: Self-pay | Admitting: *Deleted

## 2016-07-13 DIAGNOSIS — Z95810 Presence of automatic (implantable) cardiac defibrillator: Secondary | ICD-10-CM | POA: Insufficient documentation

## 2016-07-13 DIAGNOSIS — M109 Gout, unspecified: Secondary | ICD-10-CM | POA: Diagnosis not present

## 2016-07-13 DIAGNOSIS — I5022 Chronic systolic (congestive) heart failure: Secondary | ICD-10-CM | POA: Insufficient documentation

## 2016-07-13 DIAGNOSIS — K640 First degree hemorrhoids: Secondary | ICD-10-CM | POA: Insufficient documentation

## 2016-07-13 DIAGNOSIS — I4891 Unspecified atrial fibrillation: Secondary | ICD-10-CM | POA: Insufficient documentation

## 2016-07-13 DIAGNOSIS — Z7901 Long term (current) use of anticoagulants: Secondary | ICD-10-CM | POA: Insufficient documentation

## 2016-07-13 DIAGNOSIS — Z7952 Long term (current) use of systemic steroids: Secondary | ICD-10-CM | POA: Insufficient documentation

## 2016-07-13 DIAGNOSIS — Z8673 Personal history of transient ischemic attack (TIA), and cerebral infarction without residual deficits: Secondary | ICD-10-CM | POA: Diagnosis not present

## 2016-07-13 DIAGNOSIS — Z79899 Other long term (current) drug therapy: Secondary | ICD-10-CM | POA: Diagnosis not present

## 2016-07-13 DIAGNOSIS — Z1211 Encounter for screening for malignant neoplasm of colon: Secondary | ICD-10-CM | POA: Diagnosis present

## 2016-07-13 HISTORY — PX: COLONOSCOPY: SHX5424

## 2016-07-13 SURGERY — COLONOSCOPY
Anesthesia: Moderate Sedation

## 2016-07-13 MED ORDER — MEPERIDINE HCL 100 MG/ML IJ SOLN
INTRAMUSCULAR | Status: DC
Start: 2016-07-13 — End: 2016-07-13
  Filled 2016-07-13: qty 2

## 2016-07-13 MED ORDER — STERILE WATER FOR IRRIGATION IR SOLN
Status: DC | PRN
  Administered 2016-07-13: 12:00:00

## 2016-07-13 MED ORDER — ONDANSETRON HCL 4 MG/2ML IJ SOLN
INTRAMUSCULAR | Status: AC
  Filled 2016-07-13: qty 2

## 2016-07-13 MED ORDER — MEPERIDINE HCL 100 MG/ML IJ SOLN
INTRAMUSCULAR | Status: DC | PRN
  Administered 2016-07-13: 25 mg via INTRAVENOUS
  Administered 2016-07-13: 50 mg via INTRAVENOUS

## 2016-07-13 MED ORDER — MIDAZOLAM HCL 5 MG/5ML IJ SOLN
INTRAMUSCULAR | Status: AC
  Filled 2016-07-13: qty 10

## 2016-07-13 MED ORDER — SODIUM CHLORIDE 0.9 % IV SOLN
INTRAVENOUS | Status: DC
  Administered 2016-07-13: 1000 mL via INTRAVENOUS

## 2016-07-13 MED ORDER — MIDAZOLAM HCL 5 MG/5ML IJ SOLN
INTRAMUSCULAR | Status: DC | PRN
  Administered 2016-07-13 (×2): 2 mg via INTRAVENOUS
  Administered 2016-07-13: 1 mg via INTRAVENOUS

## 2016-07-13 MED ORDER — ONDANSETRON HCL 4 MG/2ML IJ SOLN
INTRAMUSCULAR | Status: DC | PRN
  Administered 2016-07-13: 4 mg via INTRAVENOUS

## 2016-07-13 NOTE — Interval H&P Note (Signed)
History and Physical Interval Note:  07/13/2016 11:32 AM  Adam [name]  has presented today for surgery, with the diagnosis of Screening  The various methods of treatment have been discussed with the patient and family. After consideration of risks, benefits and other options for treatment, the patient has consented to  Procedure(s) with comments: COLONOSCOPY (N/A) - 12:00 pm - pt can't move up due to transportation as a surgical intervention .  The patient's history has been reviewed, patient examined, no change in status, stable for surgery.  I have reviewed the patient's chart and labs.  Questions were answered to the patient's satisfaction.     No change. Anticoagulation held 3 days ago. The first ever screening colonoscopy per plan.  The risks, benefits, limitations, alternatives and imponderables have been reviewed with the patient. Questions have been answered. All parties are agreeable.   Eula Listen

## 2016-07-13 NOTE — Discharge Instructions (Addendum)
°  Colonoscopy Discharge Instructions  Read the instructions outlined below and refer to this sheet in the next few weeks. These discharge instructions provide you with general information on caring for yourself after you leave the hospital. Your doctor may also give you specific instructions. While your treatment has been planned according to the most current medical practices available, unavoidable complications occasionally occur. If you have any problems or questions after discharge, call Dr. Jena Gauss at [phone]. ACTIVITY  You may resume your regular activity, but move at a slower pace for the next 24 hours.   Take frequent rest periods for the next 24 hours.   Walking will help get rid of the air and reduce the bloated feeling in your belly (abdomen).   No driving for 24 hours (because of the medicine (anesthesia) used during the test).    Do not sign any important legal documents or operate any machinery for 24 hours (because of the anesthesia used during the test).  NUTRITION  Drink plenty of fluids.   You may resume your normal diet as instructed by your doctor.   Begin with a light meal and progress to your normal diet. Heavy or fried foods are harder to digest and may make you feel sick to your stomach (nauseated).   Avoid alcoholic beverages for 24 hours or as instructed.  MEDICATIONS  You may resume your normal medications unless your doctor tells you otherwise.  WHAT YOU CAN EXPECT TODAY  Some feelings of bloating in the abdomen.   Passage of more gas than usual.   Spotting of blood in your stool or on the toilet paper.  IF YOU HAD POLYPS REMOVED DURING THE COLONOSCOPY:  No aspirin products for 7 days or as instructed.   No alcohol for 7 days or as instructed.   Eat a soft diet for the next 24 hours.  FINDING OUT THE RESULTS OF YOUR TEST Not all test results are available during your visit. If your test results are not back during the visit, make an appointment  with your caregiver to find out the results. Do not assume everything is normal if you have not heard from your caregiver or the medical facility. It is important for you to follow up on all of your test results.  SEEK IMMEDIATE MEDICAL ATTENTION IF:  You have more than a spotting of blood in your stool.   Your belly is swollen (abdominal distention).   You are nauseated or vomiting.   You have a temperature over 101.   You have abdominal pain or discomfort that is severe or gets worse throughout the day.    Resume Eliquis today  Repeat colonoscopy in 10 years for screening purposes

## 2016-07-13 NOTE — H&P (View-Only) (Signed)
Primary Care Physician:  Kirstie Peri, MD  Primary Gastroenterologist:  Roetta Sessions, MD   Chief Complaint  Patient presents with  . Abdominal Pain    better now (was having abd pain & vomiting)    HPI:  Adam [name] is a 50 y.o. male here At the request of his PCP for further evaluation of previous abdominal pain, nausea, vomiting, diarrhea. Patient states his symptoms have now resolved. He was kept overnight at [facility] for the symptoms last month. He has symptoms basically for about 3-4 weeks. We have since received records as outlined in this note. Stool studies showed positive for Campylobacter.  Patient currently feels well. No further abdominal pain. Appetite is improved. No constipation or diarrhea. No blood in the stool or melena. Currently on prednisone for gout. Denies a family history of colon cancer or prior colonoscopy. Denies upper GI symptoms such as heartburn, dysphagia. N/v/d. [facility]. Kept overnight.     Current Outpatient Prescriptions  Medication Sig Dispense Refill  . allopurinol (ZYLOPRIM) 300 MG tablet Take 1 tablet by mouth daily.    Marland Kitchen atorvastatin (LIPITOR) 80 MG tablet Take 80 mg by mouth at bedtime.  0  . benazepril (LOTENSIN) 5 MG tablet TAKE ONE TABLET BY MOUTH ONCE DAILY 30 tablet 6  . carvedilol (COREG) 6.25 MG tablet TAKE ONE TABLET BY MOUTH TWICE DAILY 60 tablet 6  . ELIQUIS 5 MG TABS tablet Take 5 mg by mouth 2 (two) times daily.  0  . furosemide (LASIX) 40 MG tablet Take 1 tablet (40 mg total) by mouth 2 (two) times daily. 60 tablet 6  . levETIRAcetam (KEPPRA) 500 MG tablet Take 500 mg by mouth 2 (two) times daily.  0  . sotalol (BETAPACE) 120 MG tablet TAKE ONE TABLET BY MOUTH EVERY TWELVE HOURS. 60 tablet 6  . predniSONE (DELTASONE) 5 MG tablet Take 5 mg by mouth daily.     No current facility-administered medications for this visit.     Allergies as of 06/27/2016 - Review Complete 06/27/2016  Allergen Reaction Noted  .  Atorvastatin Other (See Comments) 06/05/2012    Past Medical History:  Diagnosis Date  . A-fib (HCC)   . Chronic systolic heart failure (HCC)   . CVA (cerebral infarction)   . Disability examination    Discussion of disability September, 2013  . Drug therapy    Sotalol started for VT storm  July, 2013  . Ejection fraction < 50%    Echo 06/05/12: Mild LVH, EF 20%, posterior severe HK, anterolateral severe HK, anterior severe HK, mid to apical septal AK, AK of the true apex, apical inferior HK, restrictive physiology, trivial MR, moderate LAE, mildly reduced RV function, mild TR.  Marland Kitchen Headache(784.0)    Patient seen to have headaches from spironolactone.  Drug was stopped, August, 2012  . Hypokalemia    There was some decrease in potassium with his admission in July, 2013. We will watch this very carefully.  . ICD (implantable cardiac defibrillator) battery depletion 11/11/10   St. Jude, Dr.Allred  . 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 Mitral regurgitation   . NICM (nonischemic cardiomyopathy) (HCC)    Nonischemic / catheterization October, 2011, normal coronary arteries  . Renal insufficiency    Creatinine 1.5 (GFR greater than 50)  . Sleepiness    has some sleepiness during the day  . VT (ventricular tachycardia) (HCC)    VT storm 7/13 - Sotalol started    Past Surgical History:  Procedure Laterality Date  . CARDIAC DEFIBRILLATOR PLACEMENT  11/11/10   SJM Fortify DR ICD implanted by Dr Johney FrameAllred  . TEE WITH CARDIOVERSION  11/17/08   No LV clot    Family History  Problem Relation Age of Onset  . Heart attack Father 5035    Multiple MIs  . Stroke Father   . Heart attack Brother   . Heart failure Brother     CHF  . Hypertension Mother   . Stroke Mother   . Heart attack Paternal Grandfather   . Colon cancer Neg Hx     Social History   Social History  . Marital status: Single    Spouse name: N/A  . Number of children: 5  . Years of education: N/A    Occupational History  . disabled    Social History Main Topics  . Smoking status: Never Smoker  . Smokeless tobacco: Never Used  . Alcohol use 0.0 oz/week     Comment: OCCASIONAL  (NOT HEAVY)  . Drug use: No  . Sexual activity: Not on file   Other Topics Concern  . Not on file   Social History Narrative   Lives in [place], [place] with fiance.   No regular exercise.      ROS:  General: Negative for anorexia, weight loss, fever, chills, fatigue, weakness. Eyes: Negative for vision changes.  ENT: Negative for hoarseness, difficulty swallowing , nasal congestion. CV: Negative for chest pain, angina, palpitations, dyspnea on exertion, peripheral edema.  Respiratory: Negative for dyspnea at rest, dyspnea on exertion, cough, sputum, wheezing.  GI: See history of present illness. GU:  Negative for dysuria, hematuria, urinary incontinence, urinary frequency, nocturnal urination.  MS: Negative for joint pain, low back pain.  Derm: Negative for rash or itching.  Neuro: Negative for weakness, abnormal sensation, seizure, frequent headaches, memory loss, confusion.  Psych: Negative for anxiety, depression, suicidal ideation, hallucinations.  Endo: Negative for unusual weight change.  Heme: Negative for bruising or bleeding. Allergy: Negative for rash or hives.    Physical Examination:  BP 104/73   Pulse (!) 48   Temp 97.4 F (36.3 C) (Oral)   Ht 5\' 11"  (1.803 m)   Wt 225 lb 6.4 oz (102.2 kg)   BMI 31.44 kg/m    General: Well-nourished, well-developed in no acute distress.  Head: Normocephalic, atraumatic.   Eyes: Conjunctiva pink, no icterus. Mouth: Oropharyngeal mucosa moist and pink , no lesions erythema or exudate. Neck: Supple without thyromegaly, masses, or lymphadenopathy.  Lungs: Clear to auscultation bilaterally.  Heart: Regular rate and rhythm, no murmurs rubs or gallops.  Abdomen: Bowel sounds are normal, nontender, nondistended, no hepatosplenomegaly or masses, no  abdominal bruits or    hernia , no rebound or guarding.   Rectal: not performed Extremities: No lower extremity edema. No clubbing or deformities.  Neuro: Alert and oriented x 4 , grossly normal neurologically.  Skin: Warm and dry, no rash or jaundice.   Psych: Alert and cooperative, normal mood and affect.  Labs: Labs from 05/29/2016 Creatinine 0.96, BUN 11. White blood cell count 13,400, hemoglobin 11.4, hematocrit 33.3, MCV 87.4, platelets 192,000   Labs from 05/28/2016, total bilirubin 2.3, alkaline phosphatase 38, AST 13.4, ALT 15, albumin 4.2  Stool culture positive for Campylobacter stool antigen. Negative for C. difficile, she gets toxin 1 and 2  Imaging Studies: CT abdomen and [facility] Stable degenerative disc disease in the lower lumbar spine. Liver was unremarkable.  Stomach and bowel unremarkable. Normal appendix.

## 2016-07-13 NOTE — Op Note (Signed)
[facility] Patient Name: Adam [name] Procedure Date: 07/13/2016 11:34 AM MRN: [id number] Date of Birth: 03-Mar-1966 Attending MD: Gennette Pacobert Michael Kahmari Koller , MD CSN: [id number] Age: [age] Admit Type: Outpatient Procedure:                Colonoscopy - screening Indications:              Screening for colorectal malignant neoplasm Providers:                Gennette Pacobert Michael Leland Staszewski, MD, Loma MessingLurae B. Mathis FareAlbert RN, RN,                            Birder Robsonebra Houghton, Technician Referring MD:              Medicines:                Midazolam 5 mg IV, Meperidine 75 mg IV, Ondansetron                            4 mg IV Complications:            No immediate complications. Estimated Blood Loss:     Estimated blood loss: none. Procedure:                Pre-Anesthesia Assessment:                           - Prior to the procedure, a History and Physical                            was performed, and patient medications and                            allergies were reviewed. The patient's tolerance of                            previous anesthesia was also reviewed. The risks                            and benefits of the procedure and the sedation                            options and risks were discussed with the patient.                            All questions were answered, and informed consent                            was obtained. Prior Anticoagulants: The patient has                            taken no previous anticoagulant or antiplatelet                            agents. ASA Grade Assessment: III - A patient with  severe systemic disease. After reviewing the risks                            and benefits, the patient was deemed in                            satisfactory condition to undergo the procedure.                           After obtaining informed consent, the colonoscope                            was passed under direct vision. Throughout the     procedure, the patient's blood pressure, pulse, and                            oxygen saturations were monitored continuously. The                            [id number]-i10L [id number]) scope was introduced through                            the anus and advanced to the the cecum, identified                            by appendiceal orifice and ileocecal valve. The                            colonoscopy was performed without difficulty. The                            patient tolerated the procedure well. The quality                            of the bowel preparation was adequate. The                            ileocecal valve, appendiceal orifice, and rectum                            were photographed. The entire colon was well                            visualized. Scope In: 11:43:03 AM Scope Out: 11:54:48 AM Scope Withdrawal Time: 0 hours 7 minutes 44 seconds  Total Procedure Duration: 0 hours 11 minutes 45 seconds  Findings:      The perianal and digital rectal examinations were normal.      Internal hemorrhoids were found. The hemorrhoids were moderate and Grade       I (internal hemorrhoids that do not prolapse).      The entire examined colon appeared normal. Unable to retroflex because       the rectal vault was small. Rectal mucosa seen well on?"face. Impression:               -  Internal hemorrhoids.                           - The entire examined colon is normal.                           - No specimens collected. Moderate Sedation:      Moderate (conscious) sedation was administered by the endoscopy nurse       and supervised by the endoscopist. The following parameters were       monitored: oxygen saturation, heart rate, blood pressure, respiratory       rate, EKG, adequacy of pulmonary ventilation, and response to care.       Total physician intraservice time was 16 minutes. Recommendation:           - Patient has a contact number available for                             emergencies. The signs and symptoms of potential                            delayed complications were discussed with the                            patient. Return to normal activities tomorrow.                            Written discharge instructions were provided to the                            patient.                           - Advance diet as tolerated.                           - Continue present medications.                           - Resume Eliquis (apixaban) at prior dose today.                           - Repeat colonoscopy in 10 years for screening                            purposes.                           - Return to GI office PRN. Procedure Code(s):        --- Professional ---                           [phone], Colonoscopy, flexible; diagnostic, including                            collection of specimen(s) by brushing or washing,  when performed (separate procedure)                           99152, Moderate sedation services provided by the                            same physician or other qualified health care                            professional performing the diagnostic or                            therapeutic service that the sedation supports,                            requiring the presence of an independent trained                            observer to assist in the monitoring of the                            patient's level of consciousness and physiological                            status; initial 15 minutes of intraservice time,                            patient age 26 years or older Diagnosis Code(s):        --- Professional ---                           Z12.11, Encounter for screening for malignant                            neoplasm of colon                           K64.0, First degree hemorrhoids CPT copyright 2016 American Medical Association. All rights reserved. The codes documented in this report are preliminary  and upon coder review may  be revised to meet current compliance requirements. Gerrit Friends. Ziyan Hillmer, MD Gennette Pac, MD 07/13/2016 12:02:47 PM This report has been signed electronically. Number of Addenda: 0

## 2016-07-18 ENCOUNTER — Other Ambulatory Visit: Payer: Self-pay | Admitting: Cardiology

## 2016-07-18 ENCOUNTER — Encounter (HOSPITAL_COMMUNITY): Payer: Self-pay | Admitting: Internal Medicine

## 2016-07-19 ENCOUNTER — Encounter (HOSPITAL_COMMUNITY): Payer: Self-pay | Admitting: Speech Pathology

## 2016-07-19 ENCOUNTER — Encounter (HOSPITAL_COMMUNITY): Payer: Medicare HMO | Admitting: Speech Pathology

## 2016-07-19 ENCOUNTER — Ambulatory Visit (HOSPITAL_COMMUNITY): Payer: Medicare HMO | Attending: Internal Medicine | Admitting: Speech Pathology

## 2016-07-19 DIAGNOSIS — R4701 Aphasia: Secondary | ICD-10-CM | POA: Insufficient documentation

## 2016-07-19 DIAGNOSIS — R480 Dyslexia and alexia: Secondary | ICD-10-CM | POA: Diagnosis present

## 2016-07-19 DIAGNOSIS — R488 Other symbolic dysfunctions: Secondary | ICD-10-CM | POA: Insufficient documentation

## 2016-07-19 NOTE — Therapy (Signed)
Adam [name] [address] , [place], [address] Phone: [phone]   Fax:  [phone]  Speech Language Pathology Treatment  Patient Details  Name: Adam [name] MRN: [id number] Date of Birth: Mar 22, 1966 Referring Provider: Alison MurrayJames [name]  Encounter Date: 07/19/2016      End of Session - 07/19/16 1501    Visit Number 31   Number of Visits 36   Date for SLP Re-Evaluation 07/21/16   Authorization Type [employer] Medicare; Medicaid   Authorization Time Period  06/04/2016- 07/21/2106   Authorization - Visit Number 4   Authorization - Number of Visits 8   SLP Start Time 1425   SLP Stop Time  1515   SLP Time Calculation (min) 50 min   Activity Tolerance Patient tolerated treatment well      Past Medical History:  Diagnosis Date  . A-fib (HCC)   . Chronic systolic heart failure (HCC)   . CVA (cerebral infarction)   . Disability examination    Discussion of disability September, 2013  . Drug therapy    Sotalol started for VT storm  July, 2013  . Ejection fraction < 50%    Echo 06/05/12: Mild LVH, EF 20%, posterior severe HK, anterolateral severe HK, anterior severe HK, mid to apical septal AK, AK of the true apex, apical inferior HK, restrictive physiology, trivial MR, moderate LAE, mildly reduced RV function, mild TR.  Marland Kitchen. Headache(784.0)    Patient seen to have headaches from spironolactone.  Drug was stopped, August, 2012  . Hypokalemia    There was some decrease in potassium with his admission in July, 2013. We will watch this very carefully.  . ICD (implantable cardiac defibrillator) battery depletion 11/11/10   [employer]. Jude, Adam [name]  . 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 Mitral regurgitation   . NICM (nonischemic cardiomyopathy) (HCC)    Nonischemic / catheterization October, 2011, normal coronary arteries  . Renal insufficiency    Creatinine 1.5 (GFR greater than 50)  . Sleepiness    has some sleepiness during  the day  . VT (ventricular tachycardia) (HCC)    VT storm 7/13 - Sotalol started    Past Surgical History:  Procedure Laterality Date  . CARDIAC DEFIBRILLATOR PLACEMENT  11/11/10   SJM Fortify DR ICD implanted by Dr Adam [name]  . COLONOSCOPY N/A 07/13/2016   Procedure: COLONOSCOPY;  Surgeon: Adam Adeobert M Rourk, MD;  Location: [facility];  Service: Endoscopy;  Laterality: N/A;  12:00 pm - pt can't move up due to transportation  . TEE WITH CARDIOVERSION  11/17/08   No LV clot    There were no vitals filed for this visit.      Subjective Assessment - 07/19/16 1456    Subjective "I have gout [name]."   Currently in Pain? No/denies               ADULT SLP TREATMENT - 07/19/16 1457      General Information   Behavior/Cognition Alert;Cooperative;Pleasant mood   Patient Positioning Upright in chair   Oral care provided N/A   HPI [name] is a 50 yo male [place], [place] with a past medical history of atrial fibrillation, CHF, s/p cardiac pacemaker implantation, and gout who developed right hemiplegia and global aphasia on 01/09/2016. He was taken to [facility] where his studies showed left MCA stroke. He received [facility] and was airlifted to [facility] as stroke code. He has a history of previous  stroke in the right occipital cortex per scan, but this was unknown to pt/wife. He developed SOB and was started on BIPAP during that hospitalization. He was transferred to an inpatient rehabilitation facility on 01/15/16, but showed seizure activity on 01/19/16 and was re-hospitalized. He was discharged home from that hospitalization on 01/20/2016. He has not received speech therapy since 01/19/16 unfortunately. He was referred by Dr. Alison [name] for outpatient SLP services due to aphasia from recent stroke. He was accompanied to the appointment by Adam [name], his wife     Treatment Provided   Treatment provided Cognitive-Linquistic     Pain Assessment   Pain Assessment  No/denies pain     Cognitive-Linquistic Treatment   Treatment focused on Aphasia;Patient/family/caregiver education   Skilled Treatment pt education, compensatory strategies, word finding activities, automatic speech drills     Assessment / Recommendations / Plan   Plan Continue with current plan of care            SLP Short Term Goals - 07/19/16 1503      SLP SHORT TERM GOAL #1   Title Pt will increase naming of low frequency objects/pictures to 85% acc when provided with min assist   Baseline 78%   Time 4   Period Weeks   Status On-going     SLP SHORT TERM GOAL #2   Title Pt will complete single word sentence completion tasks (SWSC) with 90% acc when provided with mod multimodality cues.   Baseline 70%   Time 8   Period Weeks   Status Achieved     SLP SHORT TERM GOAL #3   Title Pt will complete high-level/abstract divergent naming tasks wtih 90% acc with mi/mod cues.  change to 5-7 items with min cues   Baseline 75% mod assist   Time 4   Period Weeks   Status On-going     SLP SHORT TERM GOAL #4   Title Pt will complete basic sentence level reading comprehension tasks with 95% acc with provided min cues.  updated 7/27   Baseline 25%   Time 4   Period Weeks   Status On-going     SLP SHORT TERM GOAL #5   Title Pt will answer moderate level auditory comprehension yes/no questions with 80% acc when provided mod cues.   Baseline 65%   Time 8   Period Weeks   Status Achieved     SLP SHORT TERM GOAL #6   Title Pt will be able to write personal/bio information with 90% acc when provided mod cues  Continue goal   Baseline name only   Time 8   Period Weeks   Status On-going     SLP SHORT TERM GOAL #7   Title Pt will generate 5-7 word sentence when looking at dynamic picture with 90% acc when provided mod cues.   Baseline 75% acc with mod/max assist   Time 8   Period Weeks   Status On-going     SLP SHORT TERM GOAL #8   Title Pt will use compensatory  strategies (gesture, spell, write, describe) with min prompts on 90% of opportunities in conversations  updated 7/27   Baseline mod/max prompts to use total communication strategies   Time 8   Period Weeks   Status On-going          SLP Long Term Goals - 07/07/16 1752      SLP LONG TERM GOAL #1   Title Pt will communicate moderate level wants/needs/thoughts to family  and friends with use of multimodality communication strategies as needed.    Baseline mod/severe impairment   Time 3   Period Months   Status On-going          Plan - 07/19/16 1503    Clinical Impression Statement [name] reports gout discomfort. He sees Dr. Everlena Cooper this Thursday and is hopeful that he will be released for driving. He does not know if he is still taking Keppra, but EPIC shows that he is. Session focused on naming to description, providing verbal description for SLP to name, and naming low frequency pictured objects. He completed naming to description task with 100% acc when provided with min cues; he was able to provide description of written word with 75% acc with mi/mod cues; named low frequency pictured objects with 96% acc with min assist. Continue POC.   Speech Therapy Frequency 2x / week   Duration --  8 weeks   Treatment/Interventions Language facilitation;Compensatory techniques;SLP instruction and feedback;Cueing hierarchy;Patient/family education;Compensatory strategies;Multimodal communcation approach;Functional tasks   Potential to Achieve Goals Good   Potential Considerations Severity of impairments   SLP Home Exercise Plan Pt will be independent with HEP to facilitate carryover of treatment strategies and techniques in home/community environment with assist from wife as needed.    Consulted and Agree with Plan of Care Patient;Family member/caregiver   Family Member Consulted Wife, Adam [name]      Patient will benefit from skilled therapeutic intervention in order to improve  the following deficits and impairments:   Aphasia  Dyslexia and alexia  Agraphia    Problem List Patient Active Problem List   Diagnosis Date Noted  . Abdominal pain 06/27/2016  . Nausea with vomiting 06/27/2016  . Diarrhea 06/27/2016  . Lightheadedness 01/24/2014  . Nonischemic cardiomyopathy (HCC) 10/29/2013  . Chronic systolic CHF (congestive heart failure) (HCC) 04/26/2013  . Chest discomfort 04/26/2013  . Bradycardia 04/26/2013  . Automatic implantable cardioverter-defibrillator in situ 09/04/2012  . Hypokalemia   . Disability examination   . Drug therapy   . Ventricular tachyarrhythmia (HCC) 06/06/2012  . Headache(784.0)   . Sleepiness   . Mitral regurgitation   . Cardiomyopathy (HCC)   . Ejection fraction < 50%   . Renal insufficiency    Thank you,  Adam [name], Adam [name] [phone]  [facility] 07/19/2016, 3:30 PM  [facility] [address] [place], [place], [address] Phone: [phone]   Fax:  [phone]   Name: Adam [name] MRN: [id number] Date of Birth: August 03, 1966

## 2016-07-21 ENCOUNTER — Encounter: Payer: Self-pay | Admitting: Neurology

## 2016-07-21 ENCOUNTER — Telehealth (HOSPITAL_COMMUNITY): Payer: Self-pay

## 2016-07-21 ENCOUNTER — Ambulatory Visit (INDEPENDENT_AMBULATORY_CARE_PROVIDER_SITE_OTHER): Payer: Medicare HMO | Admitting: Neurology

## 2016-07-21 ENCOUNTER — Encounter (HOSPITAL_COMMUNITY): Payer: Medicare HMO | Admitting: Speech Pathology

## 2016-07-21 VITALS — BP 108/72 | HR 68 | Ht 71.0 in | Wt 231.0 lb

## 2016-07-21 DIAGNOSIS — I639 Cerebral infarction, unspecified: Secondary | ICD-10-CM

## 2016-07-21 DIAGNOSIS — R569 Unspecified convulsions: Secondary | ICD-10-CM

## 2016-07-21 DIAGNOSIS — I509 Heart failure, unspecified: Secondary | ICD-10-CM | POA: Diagnosis not present

## 2016-07-21 MED ORDER — LEVETIRACETAM 500 MG PO TABS: 500.0000 mg | ORAL_TABLET | Freq: Two times a day (BID) | ORAL | 3 refills | Status: DC

## 2016-07-21 NOTE — Patient Instructions (Signed)
1.  Since it has been longer than 6 months from your last seizure, you may resume driving 2.  Continue Keppra 500mg  twice daily.  I will send in refills. 3.  Continue Eliquis and atorvastatin as managed by your other doctors. 4.  Follow up in 6 months.

## 2016-07-21 NOTE — Telephone Encounter (Signed)
9/14 caller said that he had 2 other appts today and needed to cx the SP apt

## 2016-07-21 NOTE — Progress Notes (Signed)
NEUROLOGY FOLLOW UP OFFICE NOTE  Kash Davie Dinsmore [id number]  HISTORY OF PRESENT ILLNESS: Adam [name] is a 50 year old right-handed man with CKD stage 3, atrial fibrillation status post implantable pacemaker, OSA, gout, and CHF who follows up for probably cardioembolic stroke.  He is accompanied by his significant other who supplements history.  UPDATE: He is on Eliquis for secondary stroke prevention.  He is also on atorvastatin '80mg'$  daily. He continues on Keppra '500mg'$  twice daily.  He has not had a recurrent seizure.  He is doing well with speech therapy.  Language is improved.  His right hand is stronger.   HISTORY: He was admitted to [facility] from 01/15/16 to 01/19/16 for stroke.  He presented with right sided hemiplegia and global aphasia on day of admission.  He was first taken to [facility] where he had a NIHSS of 15 and CT of head revealed left MCA territory ischemic stroke.  He received IV tPA and was then transferred to [facility].  On arrival, NIHSS was 3.  He has atrial fibrillation with pacemaker but was not on anticoagulation.  He was started on aspirin.  Telemetry had revealed atrial fibrillation.  CXR showed bilateral infiltrate and edema, which improved on Lasix.  Echocardiogram revealed cardiomyopathy with EF 25-30% and no PFO.  Hgb A1c from 01/10/16 was 6.2.  LDL was 164.  He was subsequently discharged on Eliquis to the [facility].  On 01/19/16, he was readmitted to the hospital for evaluation of possible seizure.  He was in the bathroom when family heard a thump.  When his family entered the bathroom, he had fallen backwards, leaning on the wall and exhibited stiffness of limbs with eyes rolled back and was unresponsive for a few minutes.  He immediately recovered.  Repeat head CT that day again revealed recent left MCA stroke and remote right PCA stroke but no new findings.  EEG was limited due to significant electrode and motion artifact, but the most  readable segments were reportedly normal.  He was started on Keppra '500mg'$  twice daily.  PAST MEDICAL HISTORY: Past Medical History:  Diagnosis Date  . A-fib ([place])   . Chronic systolic heart failure ([place])   . CVA (cerebral infarction)   . Disability examination    Discussion of disability September, 2013  . Drug therapy    Sotalol started for VT storm  July, 2013  . Ejection fraction < 50%    Echo 06/05/12: Mild LVH, EF 20%, posterior severe HK, anterolateral severe HK, anterior severe HK, mid to apical septal AK, AK of the true apex, apical inferior HK, restrictive physiology, trivial MR, moderate LAE, mildly reduced RV function, mild TR.  Marland Kitchen Headache(784.0)    Patient seen to have headaches from spironolactone.  Drug was stopped, August, 2012  . Hypokalemia    There was some decrease in potassium with his admission in July, 2013. We will watch this very carefully.  . ICD (implantable cardiac defibrillator) battery depletion 11/11/10   St. Jude, Dr.Allred  . 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 Mitral regurgitation   . NICM (nonischemic cardiomyopathy) ([place])    Nonischemic / catheterization October, 2011, normal coronary arteries  . Renal insufficiency    Creatinine 1.5 (GFR greater than 50)  . Sleepiness    has some sleepiness during the day  . VT (ventricular tachycardia) ([place])    VT storm 7/13 - Sotalol started    MEDICATIONS: Current Outpatient Prescriptions on  File Prior to Visit  Medication Sig Dispense Refill  . allopurinol (ZYLOPRIM) 300 MG tablet Take 1 tablet by mouth daily.    Marland Kitchen atorvastatin (LIPITOR) 80 MG tablet Take 80 mg by mouth at bedtime.  0  . benazepril (LOTENSIN) 5 MG tablet TAKE ONE TABLET BY MOUTH ONCE DAILY 30 tablet 6  . carvedilol (COREG) 6.25 MG tablet TAKE ONE TABLET BY MOUTH TWICE DAILY 60 tablet 6  . ELIQUIS 5 MG TABS tablet Take 5 mg by mouth 2 (two) times daily.  0  . furosemide (LASIX) 40 MG tablet TAKE ONE TABLET BY MOUTH TWICE  DAILY. 60 tablet 4  . Na Sulfate-K Sulfate-Mg Sulf (SUPREP BOWEL PREP KIT) 17.5-3.13-1.6 GM/180ML SOLN Take 1 kit by mouth as directed. 1 Bottle 0  . sotalol (BETAPACE) 120 MG tablet TAKE ONE TABLET BY MOUTH EVERY TWELVE HOURS. 60 tablet 6   No current facility-administered medications on file prior to visit.     ALLERGIES: Allergies  Allergen Reactions  . Atorvastatin Other (See Comments)    headaches headaches    FAMILY HISTORY: Family History  Problem Relation Age of Onset  . Heart attack Father 61    Multiple MIs  . Stroke Father   . Heart attack Brother   . Heart failure Brother     CHF  . Hypertension Mother   . Stroke Mother   . Heart attack Paternal Grandfather   . Colon cancer Neg Hx     SOCIAL HISTORY: Social History   Social History  . Marital status: Single    Spouse name: N/A  . Number of children: 5  . Years of education: N/A   Occupational History  . disabled    Social History Main Topics  . Smoking status: Never Smoker  . Smokeless tobacco: Never Used  . Alcohol use 0.0 oz/week     Comment: OCCASIONAL  (NOT HEAVY)  . Drug use: No  . Sexual activity: Not on file   Other Topics Concern  . Not on file   Social History Narrative   Lives in [place], [place] with fiance.   No regular exercise.    REVIEW OF SYSTEMS: Constitutional: No fevers, chills, or sweats, no generalized fatigue, change in appetite Eyes: No visual changes, double vision, eye pain Ear, nose and throat: No hearing loss, ear pain, nasal congestion, sore throat Cardiovascular: No chest pain, palpitations Respiratory:  No shortness of breath at rest or with exertion, wheezes GastrointestinaI: No nausea, vomiting, diarrhea, abdominal pain, fecal incontinence Genitourinary:  No dysuria, urinary retention or frequency Musculoskeletal:  No neck pain, back pain Integumentary: No rash, pruritus, skin lesions Neurological: as above Psychiatric: No depression, insomnia,  anxiety Endocrine: No palpitations, fatigue, diaphoresis, mood swings, change in appetite, change in weight, increased thirst Hematologic/Lymphatic:  No purpura, petechiae. Allergic/Immunologic: no itchy/runny eyes, nasal congestion, recent allergic reactions, rashes  PHYSICAL EXAM: Vitals:   07/21/16 1050  BP: 108/72  Pulse: 68   General: No acute distress.  Patient appears well-groomed.  normal body habitus. Head:  Normocephalic/atraumatic Eyes:  Fundi examined but not visualized Neck: supple, no paraspinal tenderness, full range of motion Heart:  Regular rate and rhythm Lungs:  Clear to auscultation bilaterally Back: No paraspinal tenderness Neurological Exam: alert and oriented to person, place, and time. Attention span and concentration intact, recent and remote memory intact, fund of knowledge intact.  Speech fluent and not dysarthric, language intact.  CN II-XII intact. Bulk and tone normal, muscle strength 5/5 throughout.  Sensation  to light touch, temperature and vibration intact.  Deep tendon reflexes 2+ throughout, toes downgoing.  Finger to nose and heel to shin testing intact.  Gait normal, Romberg negative.  IMPRESSION: Cardioembolic stroke, symptoms resolved. 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  Continue Keppra '500mg'$  twice daily 2.  Continue Eliquis as managed by your other doctors 3.  Continue atorvastatin as managed by your PCP (LDL goal should be less than 70) 4.  He may resume driving as it has been greater than 6 months since his seizure. 5.  Follow up in 6 months.  25 minutes spent face to face with patient, over 50% spent counseling.  Metta Clines, DO  CC:  Monico Blitz, MD

## 2016-07-26 ENCOUNTER — Ambulatory Visit (HOSPITAL_COMMUNITY): Payer: Medicare HMO | Admitting: Speech Pathology

## 2016-07-26 ENCOUNTER — Encounter (HOSPITAL_COMMUNITY): Payer: Self-pay | Admitting: Speech Pathology

## 2016-07-26 DIAGNOSIS — R488 Other symbolic dysfunctions: Secondary | ICD-10-CM

## 2016-07-26 DIAGNOSIS — R4701 Aphasia: Secondary | ICD-10-CM | POA: Diagnosis not present

## 2016-07-26 DIAGNOSIS — R480 Dyslexia and alexia: Secondary | ICD-10-CM

## 2016-07-26 NOTE — Therapy (Signed)
[name], [place], [address] Phone: [phone]   Fax:  [phone]  Speech Language Pathology Treatment  Patient Details  Name: Adam [name] MRN: [id number] Date of Birth: 1966/01/20 Referring Provider: Leandro Reasoner  Encounter Date: 07/26/2016      End of Session - 07/26/16 1127    Visit Number 32   Number of Visits 36   Date for SLP Re-Evaluation 07/21/16   Authorization Type [employer]; Medicaid   Authorization Time Period  06/04/2016- 07/21/2106   Authorization - Visit Number 5   Authorization - Number of Visits 8   SLP Start Time 0962   SLP Stop Time  1210   SLP Time Calculation (min) 47 min   Activity Tolerance Patient tolerated treatment well      Past Medical History:  Diagnosis Date  . A-fib ([place])   . Chronic systolic heart failure ([place])   . CVA (cerebral infarction)   . Disability examination    Discussion of disability September, 2013  . Drug therapy    Sotalol started for VT storm  July, 2013  . Ejection fraction < 50%    Echo 06/05/12: Mild LVH, EF 20%, posterior severe HK, anterolateral severe HK, anterior severe HK, mid to apical septal AK, AK of the true apex, apical inferior HK, restrictive physiology, trivial MR, moderate LAE, mildly reduced RV function, mild TR.  Marland Kitchen Headache(784.0)    Patient seen to have headaches from spironolactone.  Drug was stopped, August, 2012  . Hypokalemia    There was some decrease in potassium with his admission in July, 2013. We will watch this very carefully.  . ICD (implantable cardiac defibrillator) battery depletion 11/11/10   St. Jude, Dr.Allred  . 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 Mitral regurgitation   . NICM (nonischemic cardiomyopathy) ([place])    Nonischemic / catheterization October, 2011, normal coronary arteries  . Renal insufficiency    Creatinine 1.5 (GFR greater than 50)  . Sleepiness    has some sleepiness during  the day  . VT (ventricular tachycardia) ([place])    VT storm 7/13 - Sotalol started    Past Surgical History:  Procedure Laterality Date  . CARDIAC DEFIBRILLATOR PLACEMENT  11/11/10   SJM Fortify DR ICD implanted by Dr Rayann Heman  . COLONOSCOPY N/A 07/13/2016   Procedure: COLONOSCOPY;  Surgeon: Daneil Dolin, MD;  Location[facility];  Service: Endoscopy;  Laterality: N/A;  12:00 pm - pt can't move up due to transportation  . TEE WITH CARDIOVERSION  11/17/08   No LV clot    There were no vitals filed for this visit.      Subjective Assessment - 07/26/16 1126    Subjective "I have to move out of my apartment."   Currently in Pain? No/denies               ADULT SLP TREATMENT - 07/26/16 1127      General Information   Behavior/Cognition Alert;Cooperative;Pleasant mood   Patient Positioning Upright in chair   Oral care provided N/A   HPI [name] is a 50 yo male from [place], [place] with a past medical history of atrial fibrillation, CHF, s/p cardiac pacemaker implantation, and gout who developed right hemiplegia and global aphasia on 01/09/2016. He was taken to [facility] where his studies showed left MCA stroke. He received IV TPA at [facility] and was airlifted to [facility] as stroke code. He has  a history of previous stroke in the right occipital cortex per scan, but this was unknown to pt/Adam [name]. He developed SOB and was started on BIPAP during that hospitalization. He was transferred to an inpatient rehabilitation facility on 01/15/16, but showed seizure activity on 01/19/16 and was re-hospitalized. He was discharged home from that hospitalization on 01/20/2016. He has not received speech therapy since 01/19/16 unfortunately. He was referred by Dr. Alison Murray for outpatient SLP services due to aphasia from recent stroke. He was accompanied to the appointment by Adam [name], his Adam [name]     Treatment Provided   Treatment provided Cognitive-Linquistic     Pain Assessment    Pain Assessment No/denies pain     Cognitive-Linquistic Treatment   Treatment focused on Aphasia;Patient/family/caregiver education   Skilled Treatment pt education, compensatory strategies, word finding activities, automatic speech drills     Assessment / Recommendations / Plan   Plan Discharge SLP treatment due to (comment)          SLP Education - 07/26/16 2126    Education provided Yes   Education Details Discharge information and HEP   Person(s) Educated Patient   Methods Explanation;Handout   Comprehension Verbalized understanding          SLP Short Term Goals - 07/26/16 2127      SLP SHORT TERM GOAL #1   Title Pt will increase naming of low frequency objects/pictures to 85% acc when provided with min assist   Baseline 78%   Time 4   Period Weeks   Status Achieved     SLP SHORT TERM GOAL #2   Title Pt will complete single word sentence completion tasks [facility]) with 90% acc when provided with mod multimodality cues.   Baseline 70%   Time 8   Period Weeks   Status Achieved     SLP SHORT TERM GOAL #3   Title Pt will complete high-level/abstract divergent naming tasks wtih 90% acc with mi/mod cues.  change to 5-7 items with min cues   Baseline 75% mod assist   Time 4   Period Weeks   Status Partially Met     SLP SHORT TERM GOAL #4   Title Pt will complete basic sentence level reading comprehension tasks with 95% acc with provided min cues.  updated 7/27   Baseline 25%   Time 4   Period Weeks   Status Partially Met     SLP SHORT TERM GOAL #5   Title Pt will answer moderate level auditory comprehension yes/no questions with 80% acc when provided mod cues.   Baseline 65%   Time 8   Period Weeks   Status Achieved     SLP SHORT TERM GOAL #6   Title Pt will be able to write personal/bio information with 90% acc when provided mod cues  Continue goal   Baseline name only   Time 8   Period Weeks   Status Achieved     SLP SHORT TERM GOAL #7   Title Pt  will generate 5-7 word sentence when looking at dynamic picture with 90% acc when provided mod cues.   Baseline 75% acc with mod/max assist   Time 8   Period Weeks   Status Achieved     SLP SHORT TERM GOAL #8   Title Pt will use compensatory strategies (gesture, spell, write, describe) with min prompts on 90% of opportunities in conversations  updated 7/27   Baseline mod/max prompts to use total communication strategies   Time  8   Period Weeks   Status Achieved          SLP Long Term Goals - August 14, 2016 2127      SLP LONG TERM GOAL #1   Title Pt will communicate moderate level wants/needs/thoughts to family and friends with use of multimodality communication strategies as needed.    Baseline mod/severe impairment   Time 3   Period Months   Status Achieved          Plan - Aug 14, 2016 2126    Clinical Impression Statement Pt discussed recent MD visits and ability to drive again. He discussed discharging from speech therapy with his spouse due to being pleased with current functional level and other external factors (possibly moving apartments). Adam [name] has made excellent progress toward all goals. He is able to communicate moderately complex thoughts and feelings to communication partner with occasional word finding difficulties. Reading comprehension continues to be an area of difficulty. He benefits from knowing context, but occasionally still experiences "word deafness". He is reminded to try to copy/write the word and look away before attempting to read the word again. He is also encouraged to continue with HEP (given by SLP) and read high interest material. Skilled SLP services will be discharged at this time, however pt may wish to pursue SLP treatment at a late time if he is motivated to further address reading deficits.   Duration --  8 weeks   Treatment/Interventions Language facilitation;Compensatory techniques;SLP instruction and feedback;Cueing hierarchy;Patient/family  education;Compensatory strategies;Multimodal communcation approach;Functional tasks   Potential to Achieve Goals Good   Potential Considerations Severity of impairments   SLP Home Exercise Plan Pt will be independent with HEP to facilitate carryover of treatment strategies and techniques in home/community environment with assist from Adam [name] as needed.    Consulted and Agree with Plan of Care Patient;Family member/caregiver   Family Member Consulted Adam [name], Adam [name]      Patient will benefit from skilled therapeutic intervention in order to improve the following deficits and impairments:   Aphasia  Dyslexia and alexia  Agraphia      G-Codes - 2016-08-14 2128    Functional Assessment Tool Used clinical judgment   Functional Limitations Spoken language expressive   Spoken Language Expression Goal Status [phone]) At least 1 percent but less than 20 percent impaired, limited or restricted   Spoken Language Expression Discharge Status [phone]) At least 1 percent but less than 20 percent impaired, limited or restricted      Problem List Patient Active Problem List   Diagnosis Date Noted  . Abdominal pain 06/27/2016  . Nausea with vomiting 06/27/2016  . Diarrhea 06/27/2016  . Lightheadedness 01/24/2014  . Nonischemic cardiomyopathy ([place]) 10/29/2013  . Chronic systolic CHF (congestive heart failure) ([place]) 04/26/2013  . Chest discomfort 04/26/2013  . Bradycardia 04/26/2013  . Automatic implantable cardioverter-defibrillator in situ 09/04/2012  . Hypokalemia   . Disability examination   . Drug therapy   . Ventricular tachyarrhythmia ([place]) 06/06/2012  . Headache(784.0)   . Sleepiness   . Mitral regurgitation   . Cardiomyopathy ([place])   . Ejection fraction < 50%   . Renal insufficiency    SPEECH THERAPY DISCHARGE SUMMARY  Visits from Start of Care: 32  Current functional level related to goals / functional outcomes: Pt with excellent progress toward goals. He is able to  communicated moderately complex thoughts and feelings to listener with occasional word finding deficits. He benefits from additional time to communicate.   Remaining deficits: See above-  Pt requires assist for sentence length reading comprehension tasks.   Education / Equipment: HEP Plan: Patient agrees to discharge.  Patient goals were partially met. Patient is being discharged due to being pleased with the current functional level.  ?????      Adam [name] 07/26/2016, 9:28 PM Thank you,  Genene Churn, [place]  [place] [address] [place], [place], [address] Phone: [phone]   Fax:  [phone]   Name: Adam [name] MRN: [id number] Date of Birth: 1966/10/18

## 2016-07-28 ENCOUNTER — Other Ambulatory Visit: Payer: Self-pay | Admitting: Cardiology

## 2016-07-28 ENCOUNTER — Ambulatory Visit (HOSPITAL_COMMUNITY): Payer: Medicare HMO | Admitting: Speech Pathology

## 2016-08-17 ENCOUNTER — Ambulatory Visit (INDEPENDENT_AMBULATORY_CARE_PROVIDER_SITE_OTHER): Payer: Medicare HMO | Admitting: *Deleted

## 2016-08-17 DIAGNOSIS — I472 Ventricular tachycardia, unspecified: Secondary | ICD-10-CM

## 2016-08-17 NOTE — Progress Notes (Signed)
Remote ICD transmission.   

## 2016-08-18 ENCOUNTER — Encounter: Payer: Self-pay | Admitting: Cardiology

## 2016-08-22 ENCOUNTER — Other Ambulatory Visit: Payer: Self-pay | Admitting: Cardiology

## 2016-09-16 LAB — CUP PACEART REMOTE DEVICE CHECK
Battery Remaining Longevity: 52 mo
Battery Remaining Percentage: 45 %
Brady Statistic AP VS Percent: 5.9 %
Brady Statistic AS VP Percent: 1 %
Brady Statistic AS VS Percent: 92 %
HighPow Impedance: 61 Ohm
HighPow Impedance: 61 Ohm
Implantable Lead Implant Date: 20120105
Implantable Lead Location: 753859
Implantable Pulse Generator Implant Date: 20120105
Lead Channel Impedance Value: 380 Ohm
Lead Channel Pacing Threshold Amplitude: 0.75 V
Lead Channel Pacing Threshold Pulse Width: 0.4 ms
Lead Channel Sensing Intrinsic Amplitude: 8.2 mV
Lead Channel Setting Pacing Amplitude: 2 V
Lead Channel Setting Pacing Pulse Width: 0.4 ms
MDC IDC LEAD IMPLANT DT: 20120105
MDC IDC LEAD LOCATION: 753860
MDC IDC LEAD MODEL: 7122
MDC IDC MSMT BATTERY VOLTAGE: 2.92 V
MDC IDC MSMT LEADCHNL RA PACING THRESHOLD AMPLITUDE: 1 V
MDC IDC MSMT LEADCHNL RA PACING THRESHOLD PULSEWIDTH: 0.4 ms
MDC IDC MSMT LEADCHNL RA SENSING INTR AMPL: 2.7 mV
MDC IDC MSMT LEADCHNL RV IMPEDANCE VALUE: 380 Ohm
MDC IDC SESS DTM: 20171011135014
MDC IDC SET LEADCHNL RV PACING AMPLITUDE: 2.5 V
MDC IDC SET LEADCHNL RV SENSING SENSITIVITY: 0.5 mV
MDC IDC STAT BRADY AP VP PERCENT: 1 %
MDC IDC STAT BRADY RA PERCENT PACED: 5.6 %
MDC IDC STAT BRADY RV PERCENT PACED: 1 %
Pulse Gen Serial Number: 622431

## 2016-10-11 ENCOUNTER — Other Ambulatory Visit: Payer: Self-pay | Admitting: Cardiology

## 2016-10-17 ENCOUNTER — Encounter: Payer: Self-pay | Admitting: Neurology

## 2016-11-26 ENCOUNTER — Other Ambulatory Visit: Payer: Self-pay | Admitting: Cardiology

## 2016-12-09 ENCOUNTER — Other Ambulatory Visit: Payer: Self-pay | Admitting: Cardiology

## 2017-01-18 ENCOUNTER — Ambulatory Visit (INDEPENDENT_AMBULATORY_CARE_PROVIDER_SITE_OTHER): Payer: Medicare HMO | Admitting: Neurology

## 2017-01-18 ENCOUNTER — Encounter: Payer: Self-pay | Admitting: Neurology

## 2017-01-18 VITALS — BP 100/64 | HR 75 | Ht 71.0 in | Wt 233.1 lb

## 2017-01-18 DIAGNOSIS — R569 Unspecified convulsions: Secondary | ICD-10-CM | POA: Diagnosis not present

## 2017-01-18 DIAGNOSIS — I4891 Unspecified atrial fibrillation: Secondary | ICD-10-CM

## 2017-01-18 DIAGNOSIS — I639 Cerebral infarction, unspecified: Secondary | ICD-10-CM | POA: Diagnosis not present

## 2017-01-18 NOTE — Patient Instructions (Signed)
1.  Continue Keppra 500mg  twice daily to prevent seizures 2.  Continue Eliquis and atorvastatin 3.  Follow up in one year or as needed.

## 2017-01-18 NOTE — Progress Notes (Signed)
NEUROLOGY FOLLOW UP OFFICE NOTE  Adam [name] [id number]  HISTORY OF PRESENT ILLNESS: Adam [name] is a 51 year old right-handed man with CKD stage 3, atrial fibrillation status post implantable pacemaker, OSA, gout, and CHF who follows up for probably cardioembolic stroke and symptomatic isolated seizure secondary to stroke.  He is accompanied by his significant other who supplements history.   UPDATE: He is on Eliquis for secondary stroke prevention.  He is also on atorvastatin 80mg  daily. He continues on Keppra 500mg  twice daily.  He is no longer in speech therapy.  Speech is much improved with minimal deficits.  Strength in right hand is intact.  He is feeling well.   HISTORY: He was admitted to [facility] from 01/15/16 to 01/19/16 for stroke.  He presented with right sided hemiplegia and global aphasia on day of admission.  He was first taken to [facility] where he had a NIHSS of 15 and CT of head revealed left MCA territory ischemic stroke.  He received IV tPA and was then transferred to [facility].  On arrival, NIHSS was 3.  He has atrial fibrillation with pacemaker but was not on anticoagulation.  He was started on aspirin.  Telemetry had revealed atrial fibrillation.  CXR showed bilateral infiltrate and edema, which improved on Lasix.  Echocardiogram revealed cardiomyopathy with EF 25-30% and no PFO.  Hgb A1c from 01/10/16 was 6.2.  LDL was 164.  He was subsequently discharged on Eliquis to the [facility].  On 01/19/16, he was readmitted to the hospital for evaluation of possible seizure.  He was in the bathroom when family heard a thump.  When his family entered the bathroom, he had fallen backwards, leaning on the wall and exhibited stiffness of limbs with eyes rolled back and was unresponsive for a few minutes.  He immediately recovered.  Repeat head CT that day again revealed recent left MCA stroke and remote right PCA stroke but no new findings.  EEG was  limited due to significant electrode and motion artifact, but the most readable segments were reportedly normal.   PAST MEDICAL HISTORY: Past Medical History:  Diagnosis Date  . A-fib (H[facility])   . Chronic systolic heart failure (H[facility])   . CVA (cerebral infarction)   . Disability examination    Discussion of disability September, 2013  . Drug therapy    Sotalol started for VT storm  July, 2013  . Ejection fraction < 50%    Echo 06/05/12: Mild LVH, EF 20%, posterior severe HK, anterolateral severe HK, anterior severe HK, mid to apical septal AK, AK of the true apex, apical inferior HK, restrictive physiology, trivial MR, moderate LAE, mildly reduced RV function, mild TR.  Marland Kitchen Headache(784.0)    Patient seen to have headaches from spironolactone.  Drug was stopped, August, 2012  . Hypokalemia    There was some decrease in potassium with his admission in July, 2013. We will watch this very carefully.  . ICD (implantable cardiac defibrillator) battery depletion 11/11/10   St. Jude, Dr.Allred  . 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 Mitral regurgitation   . NICM (nonischemic cardiomyopathy) (HCC)    Nonischemic / catheterization October, 2011, normal coronary arteries  . Renal insufficiency    Creatinine 1.5 (GFR greater than 50)  . Sleepiness    has some sleepiness during the day  . VT (ventricular tachycardia) (HCC)    VT storm 7/13 - Sotalol started    MEDICATIONS: Current Outpatient  Prescriptions on File Prior to Visit  Medication Sig Dispense Refill  . allopurinol (ZYLOPRIM) 300 MG tablet Take 1 tablet by mouth daily.    Marland Kitchen atorvastatin (LIPITOR) 80 MG tablet Take 80 mg by mouth at bedtime.  0  . benazepril (LOTENSIN) 5 MG tablet TAKE ONE TABLET BY MOUTH ONCE DAILY 30 tablet 6  . carvedilol (COREG) 6.25 MG tablet TAKE ONE TABLET BY MOUTH TWICE DAILY 60 tablet 3  . ELIQUIS 5 MG TABS tablet Take 5 mg by mouth 2 (two) times daily.  0  . furosemide (LASIX) 40 MG tablet TAKE  ONE TABLET BY MOUTH TWICE DAILY. 60 tablet 4  . levETIRAcetam (KEPPRA) 500 MG tablet Take 1 tablet (500 mg total) by mouth 2 (two) times daily. 180 tablet 3  . sotalol (BETAPACE) 120 MG tablet TAKE ONE TABLET BY MOUTH EVERY TWELVE HOURS. 60 tablet 3   No current facility-administered medications on file prior to visit.     ALLERGIES: Allergies  Allergen Reactions  . Atorvastatin Other (See Comments)    headaches headaches    FAMILY HISTORY: Family History  Problem Relation Age of Onset  . Heart attack Father 9    Multiple MIs  . Stroke Father   . Heart attack Brother   . Heart failure Brother     CHF  . Hypertension Mother   . Stroke Mother   . Heart attack Paternal Grandfather   . Colon cancer Neg Hx     SOCIAL HISTORY: Social History   Social History  . Marital status: Single    Spouse name: N/A  . Number of children: 5  . Years of education: N/A   Occupational History  . disabled    Social History Main Topics  . Smoking status: Never Smoker  . Smokeless tobacco: Never Used  . Alcohol use 0.0 oz/week     Comment: OCCASIONAL  (NOT HEAVY)  . Drug use: No  . Sexual activity: Not on file   Other Topics Concern  . Not on file   Social History Narrative   Lives in [place], [place] with fiance.   No regular exercise.    REVIEW OF SYSTEMS: Constitutional: No fevers, chills, or sweats, no generalized fatigue, change in appetite Eyes: No visual changes, double vision, eye pain Ear, nose and throat: No hearing loss, ear pain, nasal congestion, sore throat Cardiovascular: No chest pain, palpitations Respiratory:  No shortness of breath at rest or with exertion, wheezes GastrointestinaI: No nausea, vomiting, diarrhea, abdominal pain, fecal incontinence Genitourinary:  No dysuria, urinary retention or frequency Musculoskeletal:  No neck pain, back pain Integumentary: No rash, pruritus, skin lesions Neurological: as above Psychiatric: No depression, insomnia,  anxiety Endocrine: No palpitations, fatigue, diaphoresis, mood swings, change in appetite, change in weight, increased thirst Hematologic/Lymphatic:  No purpura, petechiae. Allergic/Immunologic: no itchy/runny eyes, nasal congestion, recent allergic reactions, rashes  PHYSICAL EXAM: Vitals:   01/18/17 1056  BP: 100/64  Pulse: 75   General: No acute distress.  Patient appears well-groomed.  normal body habitus. Head:  Normocephalic/atraumatic Eyes:  Fundi examined but not visualized Neck: supple, no paraspinal tenderness, full range of motion Heart:  Regular rate and rhythm Lungs:  Clear to auscultation bilaterally Back: No paraspinal tenderness Neurological Exam: alert and oriented to person, place, and time. Attention span and concentration intact, recent and remote memory intact, fund of knowledge intact.  Speech fluent and not dysarthric, language intact.  CN II-XII intact. Bulk and tone normal, muscle strength 5/5 throughout.  Sensation to light touch  intact.  Deep tendon reflexes 2+ throughout.  Finger to nose testing intact.  Gait normal, Romberg negative  IMPRESSION: Cardioembolic stroke, symptoms resolved. 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  Continue Keppra 500mg  twice daily 2.  Continue Eliquis as managed by your other doctors 3.  Continue atorvastatin as managed by your PCP (LDL goal should be less than 70) 4.  Follow up in one year or as needed.  15 minutes spent face to face with patient, over 50% spent discussing management.  Shon Millet, DO  CC:  Kirstie Peri, MD

## 2017-02-15 ENCOUNTER — Other Ambulatory Visit: Payer: Self-pay | Admitting: Cardiology

## 2017-02-17 ENCOUNTER — Ambulatory Visit (INDEPENDENT_AMBULATORY_CARE_PROVIDER_SITE_OTHER): Payer: Medicare HMO | Admitting: Internal Medicine

## 2017-02-17 ENCOUNTER — Encounter: Payer: Self-pay | Admitting: Internal Medicine

## 2017-02-17 VITALS — BP 104/70 | HR 55 | Ht 70.0 in | Wt 232.0 lb

## 2017-02-17 DIAGNOSIS — I5022 Chronic systolic (congestive) heart failure: Secondary | ICD-10-CM

## 2017-02-17 DIAGNOSIS — I472 Ventricular tachycardia, unspecified: Secondary | ICD-10-CM

## 2017-02-17 DIAGNOSIS — I428 Other cardiomyopathies: Secondary | ICD-10-CM

## 2017-02-17 NOTE — Progress Notes (Signed)
PCP: Adam Peri, MD Primary Cardiologist: Adam [name] is a 51 y.o. male who presents today for routine electrophysiology followup.  He had a large stroke 3/17.  He has made slow but good recovery.  He has some issues with memory.  He was started on eliquis post stroke.  He denies any episodes of symptomatic VT or ICD therapies since last visit.  He has occasional SOB and orthopnea.  He has been to the ED recently due to CHF.   Today, he denies symptoms of palpitations, chest pain, lower extremity edema, dizziness, presyncope, syncope, or ICD shocks.  The patient is otherwise without complaint today.   Past Medical History:  Diagnosis Date  . A-fib (HCC)   . Chronic systolic heart failure (HCC)   . CVA (cerebral infarction)   . Disability examination    Discussion of disability September, 2013  . Drug therapy    Sotalol started for VT storm  July, 2013  . Ejection fraction < 50%    Echo 06/05/12: Mild LVH, EF 20%, posterior severe HK, anterolateral severe HK, anterior severe HK, mid to apical septal AK, AK of the true apex, apical inferior HK, restrictive physiology, trivial MR, moderate LAE, mildly reduced RV function, mild TR.  Marland Kitchen Headache(784.0)    Patient seen to have headaches from spironolactone.  Drug was stopped, August, 2012  . Hypokalemia    There was some decrease in potassium with his admission in July, 2013. We will watch this very carefully.  . ICD (implantable cardiac defibrillator) battery depletion 11/11/10   St. Jude, Adam.Raymundo [name]  . 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 Mitral regurgitation   . NICM (nonischemic cardiomyopathy) (HCC)    Nonischemic / catheterization October, 2011, normal coronary arteries  . Renal insufficiency    Creatinine 1.5 (GFR greater than 50)  . Sleepiness    has some sleepiness during the day  . VT (ventricular tachycardia) (HCC)    VT storm 7/13 - Sotalol started   Past Surgical History:  Procedure Laterality  Date  . CARDIAC DEFIBRILLATOR PLACEMENT  11/11/10   SJM Fortify Adam ICD implanted by Adam [name]  . COLONOSCOPY N/A 07/13/2016   Procedure: COLONOSCOPY;  Surgeon: Adam Ade, MD;  Location: [facility];  Service: Endoscopy;  Laterality: N/A;  12:00 pm - pt can't move up due to transportation  . TEE WITH CARDIOVERSION  11/17/08   No LV clot    Current Outpatient Prescriptions  Medication Sig Dispense Refill  . allopurinol (ZYLOPRIM) 300 MG tablet Take 1 tablet by mouth daily.    Marland Kitchen atorvastatin (LIPITOR) 80 MG tablet Take 80 mg by mouth at bedtime.  0  . benazepril (LOTENSIN) 5 MG tablet TAKE ONE TABLET BY MOUTH ONCE DAILY 30 tablet 3  . carvedilol (COREG) 6.25 MG tablet TAKE ONE TABLET BY MOUTH TWICE DAILY 60 tablet 3  . ELIQUIS 5 MG TABS tablet Take 5 mg by mouth 2 (two) times daily.  0  . furosemide (LASIX) 40 MG tablet TAKE ONE TABLET BY MOUTH TWICE DAILY. 60 tablet 4  . levETIRAcetam (KEPPRA) 500 MG tablet Take 1 tablet (500 mg total) by mouth 2 (two) times daily. 180 tablet 3  . sotalol (BETAPACE) 120 MG tablet TAKE ONE TABLET BY MOUTH EVERY TWELVE HOURS. 60 tablet 3   No current facility-administered medications for this visit.     Physical Exam: Vitals:   02/17/17 1121  BP: 104/70  Pulse: (!) 55  SpO2:  97%  Weight: 232 lb (105.2 kg)  Height: 5\' 10"  (1.778 m)    GEN- The patient is well appearing, alert and oriented x 3 today.  pleasant Head- normocephalic, atraumatic Eyes-  Sclera clear, conjunctiva pink Ears- hearing intact Oropharynx- clear Lungs- Clear to ausculation bilaterally, normal work of breathing Chest- ICD pocket is well healed Heart- Regular rate and rhythm, no murmurs, rubs or gallops, PMI not laterally displaced GI- soft, NT, ND, + BS Extremities- no clubbing, cyanosis, or edema  ICD interrogation- personally reviewed in detail today,  See PACEART report ekg today reveals sinus rhythm 65 bpm, inferior infract, Qtc 495, PVCs  Assessment and Plan:  1.   Chronic systolic dysfunction euvolemic today Stable on an appropriate medical regimen No changes today Daily weights and 2 gram sodium restriction advised reenroll in ICM clinic (Unable to participate last year due to stroke) bmet, bnp today  2. VT Controlled with sotalol qtc is stable Bmet, mg today Normal ICD function See Pace Art report No changes today I have discussed SJM Fortify [place] advisary with the patient today. no indication for device replacement at this time Compliance with remotes encouraged  3. s/p stroke On eliquis I do not see any AF on his ICD Cbc today   merlin Return to see me in 12 months in [place] Follow-up with Adam [name] as scheduled  Adam Range MD, [facility] 02/17/2017 11:45 AM

## 2017-02-17 NOTE — Patient Instructions (Signed)
Medication Instructions:  Continue all current medications.  Labwork:  BMET, Mg, BNP, CBC - orders given today.  Office will contact with results via phone or letter.    Testing/Procedures: none  Follow-Up: Your physician wants you to follow up in:  1 year.  You will receive a reminder letter in the mail one-two months in advance.  If you don't receive a letter, please call our office to schedule the follow up appointment - Dr. Johney Frame.  Any Other Special Instructions Will Be Listed Below (If Applicable). Enroll in [facility] clinic.   If you need a refill on your cardiac medications before your next appointment, please call your pharmacy.

## 2017-02-21 LAB — CUP PACEART INCLINIC DEVICE CHECK
Brady Statistic RA Percent Paced: 5.9 %
HighPow Impedance: 65.25 Ohm
Implantable Lead Implant Date: 20120105
Implantable Lead Location: 753859
Implantable Lead Location: 753860
Implantable Lead Model: 7122
Lead Channel Impedance Value: 400 Ohm
Lead Channel Impedance Value: 425 Ohm
Lead Channel Pacing Threshold Amplitude: 0.75 V
Lead Channel Pacing Threshold Pulse Width: 0.4 ms
Lead Channel Pacing Threshold Pulse Width: 0.4 ms
Lead Channel Pacing Threshold Pulse Width: 0.4 ms
Lead Channel Sensing Intrinsic Amplitude: 11.2 mV
Lead Channel Sensing Intrinsic Amplitude: 4.2 mV
Lead Channel Setting Pacing Amplitude: 2 V
Lead Channel Setting Pacing Amplitude: 2.5 V
Lead Channel Setting Pacing Pulse Width: 0.4 ms
Lead Channel Setting Sensing Sensitivity: 0.5 mV
MDC IDC LEAD IMPLANT DT: 20120105
MDC IDC MSMT LEADCHNL RA PACING THRESHOLD AMPLITUDE: 0.75 V
MDC IDC MSMT LEADCHNL RV PACING THRESHOLD AMPLITUDE: 1 V
MDC IDC MSMT LEADCHNL RV PACING THRESHOLD AMPLITUDE: 1 V
MDC IDC MSMT LEADCHNL RV PACING THRESHOLD PULSEWIDTH: 0.4 ms
MDC IDC PG IMPLANT DT: 20120105
MDC IDC SESS DTM: 20180413153659
MDC IDC STAT BRADY RV PERCENT PACED: 0.04 %
Pulse Gen Serial Number: 622431

## 2017-02-28 ENCOUNTER — Telehealth: Payer: Self-pay | Admitting: *Deleted

## 2017-02-28 NOTE — Telephone Encounter (Signed)
Notes recorded by Lesle Chris, LPN on 8/45/3646 at 3:50 PM EDT Patient notified. Copy to pmd. ------  Notes recorded by Hillis Range, MD on 02/22/2017 at 9:55 PM EDT Results reviewed. Inocencio Homes, please inform pt of result. I will route to primary care also.

## 2017-03-09 ENCOUNTER — Ambulatory Visit: Payer: Medicare HMO | Admitting: Cardiology

## 2017-03-10 ENCOUNTER — Telehealth: Payer: Self-pay

## 2017-03-10 NOTE — Telephone Encounter (Signed)
Referred to ICM clinic by Dr Johney Frame (patient previously in [facility] before stroke).  Call to patient today for ICM intro and he reported he was just in the hospital for pneumonia.  He requested I speak with his wife on Monday and to call cell phone [phone].  Advised I would call her next week

## 2017-03-13 NOTE — Telephone Encounter (Signed)
Call to wife as patient requested and provided ICM intro.  She agreed to monthly ICM calls. She said patient was in the hospital 2 days last week for pneumonia.  She said he is not sleeping well still has a cough.  He has a follow up appt with PCP on 03/17/2017.  I explained if she would like to send a manual transmission tomorrow then I can check his fluid levels and she agreed to send tomorrow morning.

## 2017-03-14 ENCOUNTER — Telehealth: Payer: Self-pay | Admitting: Cardiology

## 2017-03-14 NOTE — Telephone Encounter (Signed)
LMOVM reminding pt to send remote transmission.   

## 2017-03-16 ENCOUNTER — Telehealth: Payer: Self-pay

## 2017-03-16 NOTE — Telephone Encounter (Signed)
Call to wife and advised no ICM remote transmission was received and she reported she never sent it. She said she was sick and did not remember to do it.  Advised to send on 03/20/2017 and she said she would do so.

## 2017-03-18 ENCOUNTER — Other Ambulatory Visit: Payer: Self-pay | Admitting: Cardiology

## 2017-03-20 ENCOUNTER — Ambulatory Visit (INDEPENDENT_AMBULATORY_CARE_PROVIDER_SITE_OTHER): Payer: Medicare HMO

## 2017-03-20 DIAGNOSIS — Z95810 Presence of automatic (implantable) cardiac defibrillator: Secondary | ICD-10-CM

## 2017-03-20 DIAGNOSIS — I5022 Chronic systolic (congestive) heart failure: Secondary | ICD-10-CM

## 2017-03-20 NOTE — Progress Notes (Signed)
EPIC Encounter for ICM Monitoring  Patient Name: Adam [name] is a 51 y.o. male Date: 03/20/2017 Primary Care Physican: Monico Blitz, MD Primary Cardiologist: Branch Electrophysiologist: Allred Dry Weight: 230 lbs        1st ICM encounter.  Spoke with wife.  Heart Failure questions reviewed, pt asymptomatic now but had pneumonia for several weeks.  Still has slight cough.   Thoracic impedance normal but was abnormal suggesting fluid accumulation from 02/19/2017 to 02/28/2017 and 03/05/2017 to 03/15/2017.  Patient has had pneumonia.  Prescribed dosage: Furosemide 40 mg 1 tablet daily  Labs: 02/21/2017 Creatinine 1.16, BUN 9, Potassium 3.6, Sodium 140, EGFR >60  Recommendations: No changes. Discussed to limit salt intake to 2000 mg/day and fluid intake to < 2 liters/day.  Encouraged to call for fluid symptoms or use local ER for any urgent symptoms.  Follow-up plan: ICM clinic phone appointment on 04/20/2017.    Copy of ICM check sent to cardiologist and device physician.   3 month ICM trend: 03/20/2017   1 Year ICM trend:      Rosalene Billings, RN 03/20/2017 1:35 PM

## 2017-03-31 ENCOUNTER — Other Ambulatory Visit: Payer: Self-pay | Admitting: Cardiology

## 2017-04-20 ENCOUNTER — Ambulatory Visit (INDEPENDENT_AMBULATORY_CARE_PROVIDER_SITE_OTHER): Payer: Medicare HMO

## 2017-04-20 DIAGNOSIS — I5022 Chronic systolic (congestive) heart failure: Secondary | ICD-10-CM | POA: Diagnosis not present

## 2017-04-20 DIAGNOSIS — Z95810 Presence of automatic (implantable) cardiac defibrillator: Secondary | ICD-10-CM

## 2017-04-20 NOTE — Progress Notes (Signed)
EPIC Encounter for ICM Monitoring  Patient Name: Adam [name] is a 51 y.o. male Date: 04/20/2017 Primary Care Physican: Monico Blitz, MD Primary Cardiologist: Branch Electrophysiologist: Allred Dry Weight:  230 lbs        Spoke with wife.  Heart Failure questions reviewed, pt asymptomatic.   Thoracic impedance is above baseline normal but was abnormal suggesting fluid accumulation from 04/01/2017 to 04/10/2017.  Prescribed dosage: Furosemide 40 mg 1 tablet twice a day  Labs: 02/21/2017 Creatinine 1.16, BUN 9, Potassium 3.6, Sodium 140, EGFR >60  Recommendations: No changes.  Advised to limit salt intake to 2000 mg/day and fluid intake to < 2 liters/day.  Encouraged to call for fluid symptoms.  Follow-up plan: ICM clinic phone appointment on 05/22/2017.    Copy of ICM check sent to device physician.   3 month ICM trend: 04/20/2017   1 Year ICM trend:      Rosalene Billings, RN 04/20/2017 11:48 AM

## 2017-05-22 ENCOUNTER — Ambulatory Visit (INDEPENDENT_AMBULATORY_CARE_PROVIDER_SITE_OTHER): Payer: Medicare HMO | Admitting: *Deleted

## 2017-05-22 ENCOUNTER — Other Ambulatory Visit: Payer: Self-pay | Admitting: Neurology

## 2017-05-22 DIAGNOSIS — I428 Other cardiomyopathies: Secondary | ICD-10-CM | POA: Diagnosis not present

## 2017-05-22 DIAGNOSIS — I5022 Chronic systolic (congestive) heart failure: Secondary | ICD-10-CM

## 2017-05-22 DIAGNOSIS — Z95810 Presence of automatic (implantable) cardiac defibrillator: Secondary | ICD-10-CM | POA: Diagnosis not present

## 2017-05-22 NOTE — Progress Notes (Signed)
EPIC Encounter for ICM Monitoring  Patient Name: Adam [name] is a 51 y.o. male Date: 05/22/2017 Primary Care Physican: Monico Blitz, MD Primary Cardiologist: Branch Electrophysiologist: Allred Dry Weight:230lbs          Spoke with wife. Heart Failure questions reviewed, pt asymptomatic .   Thoracic impedance starting to trend below baseine since 05/21/2017.  Prescribed: Furosemide 40 mg 1 tablet twice a day  Labs: 02/21/2017 Creatinine 1.16, BUN 9, Potassium 3.6, Sodium 140, EGFR >60  Recommendations:  Advised to limit salt intake to 2000 mg/day and fluid intake to < 2 liters/day.  Encouraged to call for fluid symptoms.  Follow-up plan: ICM clinic phone appointment on 06/22/2017.    Copy of ICM check sent to primary cardiologist and device physician.   3 month ICM trend: 05/22/2017   1 Year ICM trend:      Rosalene Billings, RN 05/22/2017 11:36 AM

## 2017-05-24 ENCOUNTER — Encounter: Payer: Self-pay | Admitting: Cardiology

## 2017-05-24 LAB — CUP PACEART REMOTE DEVICE CHECK
Brady Statistic AP VP Percent: 1 %
Brady Statistic AP VS Percent: 4 %
Brady Statistic AS VP Percent: 1 %
Brady Statistic RA Percent Paced: 3.2 %
Brady Statistic RV Percent Paced: 1 %
HighPow Impedance: 68 Ohm
HighPow Impedance: 68 Ohm
Implantable Lead Implant Date: 20120105
Implantable Lead Implant Date: 20120105
Implantable Lead Location: 753859
Implantable Lead Model: 7122
Lead Channel Impedance Value: 380 Ohm
Lead Channel Pacing Threshold Amplitude: 0.75 V
Lead Channel Pacing Threshold Amplitude: 1 V
Lead Channel Pacing Threshold Pulse Width: 0.4 ms
Lead Channel Pacing Threshold Pulse Width: 0.4 ms
Lead Channel Sensing Intrinsic Amplitude: 2.6 mV
Lead Channel Setting Pacing Amplitude: 2.5 V
Lead Channel Setting Pacing Pulse Width: 0.4 ms
MDC IDC LEAD LOCATION: 753860
MDC IDC MSMT BATTERY REMAINING LONGEVITY: 45 mo
MDC IDC MSMT BATTERY REMAINING PERCENTAGE: 39 %
MDC IDC MSMT BATTERY VOLTAGE: 2.89 V
MDC IDC MSMT LEADCHNL RV IMPEDANCE VALUE: 390 Ohm
MDC IDC MSMT LEADCHNL RV SENSING INTR AMPL: 10.5 mV
MDC IDC PG IMPLANT DT: 20120105
MDC IDC PG SERIAL: 622431
MDC IDC SESS DTM: 20180716112140
MDC IDC SET LEADCHNL RA PACING AMPLITUDE: 2 V
MDC IDC SET LEADCHNL RV SENSING SENSITIVITY: 0.5 mV
MDC IDC STAT BRADY AS VS PERCENT: 93 %

## 2017-06-22 ENCOUNTER — Ambulatory Visit (INDEPENDENT_AMBULATORY_CARE_PROVIDER_SITE_OTHER): Payer: Medicare HMO

## 2017-06-22 DIAGNOSIS — I5022 Chronic systolic (congestive) heart failure: Secondary | ICD-10-CM

## 2017-06-22 DIAGNOSIS — Z95810 Presence of automatic (implantable) cardiac defibrillator: Secondary | ICD-10-CM

## 2017-06-22 NOTE — Progress Notes (Signed)
EPIC Encounter for ICM Monitoring  Patient Name: Diana Armijo Nilan is a 51 y.o. male Date: 06/22/2017 Primary Care Physican: Monico Blitz, MD Primary Cardiologist: Branch Electrophysiologist: Allred Dry Weight:230lbs                                        Spoke with wife.  Heart Failure questions reviewed, pt asymptomatic.   Thoracic impedance normal but was abnormal suggesting fluid accumulation 7/15 to 7/30 (15 days) and no symptoms during that time.  Prescribed dosage: Furosemide 40 mg 1 tablet twice a day  Labs: 02/21/2017 Creatinine 1.16, BUN 9, Potassium 3.6, Sodium 140, EGFR >60  Recommendations: No changes.   Encouraged to call for fluid symptoms.  Follow-up plan: ICM clinic phone appointment on 07/24/2017.    Copy of ICM check sent to device physician.   3 month ICM trend: 06/22/2017   1 Year ICM trend:      Rosalene Billings, RN 06/22/2017 10:10 AM

## 2017-07-11 ENCOUNTER — Other Ambulatory Visit: Payer: Self-pay | Admitting: Cardiology

## 2017-07-24 ENCOUNTER — Ambulatory Visit (INDEPENDENT_AMBULATORY_CARE_PROVIDER_SITE_OTHER): Payer: Medicare HMO

## 2017-07-24 ENCOUNTER — Telehealth: Payer: Self-pay | Admitting: Cardiology

## 2017-07-24 DIAGNOSIS — Z95810 Presence of automatic (implantable) cardiac defibrillator: Secondary | ICD-10-CM

## 2017-07-24 DIAGNOSIS — I5022 Chronic systolic (congestive) heart failure: Secondary | ICD-10-CM

## 2017-07-24 NOTE — Telephone Encounter (Signed)
LMOVM reminding pt to send remote transmission.   

## 2017-07-25 ENCOUNTER — Other Ambulatory Visit: Payer: Self-pay | Admitting: Cardiology

## 2017-07-25 NOTE — Progress Notes (Signed)
EPIC Encounter for ICM Monitoring  Patient Name: Adam [name] is a 51 y.o. male Date: 07/25/2017 Primary Care Physican: Monico Blitz, MD Primary Cardiologist: Branch Electrophysiologist: Allred Dry Weight:230lbs        Spoke with wife.  Heart Failure questions reviewed, pt is using 2-3 extra pillows at night due to shortness of breath.   Thoracic impedance normal today but has been abnormal suggesting numerous days of fluid accumulation since 07/08/17.  Prescribed dosage: Furosemide 40 mg 1 tablet twice a day.  Patient was advised by Dr Manuella Ghazi in 2017 to take Furosemide 1 tablet a day because patient was complaining about urinating so often.  Labs: 02/21/2017 Creatinine 1.16, BUN 9, Potassium 3.6, Sodium 140, EGFR >60  Recommendations: Advised to take Furosemide as prescribed for the next 3 days and if nighttime shortness of breath has resolved then he may resume the previous dosage.  Advised to discuss Furosemide dosage with Dr Harl Bowie at next office appointment.   Follow-up plan: ICM clinic phone appointment on 07/28/2017 (manual send) to recheck fluid levels.  Patient will call to reschedule office appointment with Dr Harl Bowie since he had to cancel one recently.  Copy of ICM check sent to Dr Harl Bowie and Dr. Rayann Heman.   3 month ICM trend: 07/25/2017   1 Year ICM trend:      Rosalene Billings, RN 07/25/2017 10:36 AM

## 2017-07-28 ENCOUNTER — Telehealth: Payer: Self-pay | Admitting: Cardiology

## 2017-07-28 NOTE — Telephone Encounter (Signed)
LMOVM reminding pt to send remote transmission.   

## 2017-08-01 NOTE — Progress Notes (Signed)
No ICM remote transmission received for 07/28/2017 and next ICM transmission scheduled for 08/24/2017.

## 2017-08-14 ENCOUNTER — Other Ambulatory Visit: Payer: Self-pay | Admitting: Internal Medicine

## 2017-08-16 NOTE — Telephone Encounter (Signed)
This is Dr. Branch's pt 

## 2017-08-21 ENCOUNTER — Telehealth: Payer: Self-pay | Admitting: Cardiology

## 2017-08-21 NOTE — Telephone Encounter (Signed)
Requested records from [facility]. 

## 2017-08-21 NOTE — Telephone Encounter (Signed)
Walked in  Patient just released f rom [facility] and was told to schedule a test with [place] to see if he has too much fluid?  Stated they were told they were unsure if it was his heart or if he is just drinking too many fluids.

## 2017-08-22 NOTE — Telephone Encounter (Signed)
LMTCB

## 2017-08-22 NOTE — Telephone Encounter (Signed)
Records faxed to [place]. Copy put on Dr. Verna Czech desk in [place]

## 2017-08-22 NOTE — Telephone Encounter (Signed)
Hospital records only mention him being treated for pneumonia. He is overdue for a f/u with me, please have him schedule for next available.

## 2017-08-23 NOTE — Telephone Encounter (Signed)
Patient scheduled for 11/6 in [place].

## 2017-08-24 ENCOUNTER — Telehealth: Payer: Self-pay | Admitting: Cardiology

## 2017-08-24 ENCOUNTER — Ambulatory Visit (INDEPENDENT_AMBULATORY_CARE_PROVIDER_SITE_OTHER): Payer: Medicare HMO | Admitting: *Deleted

## 2017-08-24 DIAGNOSIS — I428 Other cardiomyopathies: Secondary | ICD-10-CM

## 2017-08-24 NOTE — Telephone Encounter (Signed)
Spoke with pt and reminded pt of remote transmission that is due today. Pt verbalized understanding.   

## 2017-08-25 NOTE — Progress Notes (Signed)
Remote ICD transmission.   

## 2017-08-31 ENCOUNTER — Encounter: Payer: Self-pay | Admitting: Cardiology

## 2017-08-31 NOTE — Progress Notes (Unsigned)
Next ICM Remote Transmission scheduled for 09/14/2017

## 2017-09-14 ENCOUNTER — Telehealth: Payer: Self-pay

## 2017-09-14 ENCOUNTER — Ambulatory Visit (INDEPENDENT_AMBULATORY_CARE_PROVIDER_SITE_OTHER): Payer: Medicare HMO

## 2017-09-14 DIAGNOSIS — I5022 Chronic systolic (congestive) heart failure: Secondary | ICD-10-CM

## 2017-09-14 DIAGNOSIS — Z95810 Presence of automatic (implantable) cardiac defibrillator: Secondary | ICD-10-CM

## 2017-09-14 LAB — CUP PACEART REMOTE DEVICE CHECK
Battery Voltage: 2.89 V
Brady Statistic AP VP Percent: 1 %
Brady Statistic AS VS Percent: 92 %
Brady Statistic RV Percent Paced: 1 %
Date Time Interrogation Session: 20181019015525
HIGH POWER IMPEDANCE MEASURED VALUE: 54 Ohm
HighPow Impedance: 54 Ohm
Implantable Lead Implant Date: 20120105
Implantable Lead Location: 753859
Implantable Lead Model: 7122
Implantable Pulse Generator Implant Date: 20120105
Lead Channel Pacing Threshold Amplitude: 0.75 V
Lead Channel Pacing Threshold Pulse Width: 0.4 ms
Lead Channel Pacing Threshold Pulse Width: 0.4 ms
Lead Channel Sensing Intrinsic Amplitude: 6.4 mV
Lead Channel Setting Pacing Amplitude: 2 V
Lead Channel Setting Pacing Amplitude: 2.5 V
MDC IDC LEAD IMPLANT DT: 20120105
MDC IDC LEAD LOCATION: 753860
MDC IDC MSMT BATTERY REMAINING LONGEVITY: 41 mo
MDC IDC MSMT BATTERY REMAINING PERCENTAGE: 36 %
MDC IDC MSMT LEADCHNL RA IMPEDANCE VALUE: 350 Ohm
MDC IDC MSMT LEADCHNL RA SENSING INTR AMPL: 3 mV
MDC IDC MSMT LEADCHNL RV IMPEDANCE VALUE: 380 Ohm
MDC IDC MSMT LEADCHNL RV PACING THRESHOLD AMPLITUDE: 1 V
MDC IDC SET LEADCHNL RV PACING PULSEWIDTH: 0.4 ms
MDC IDC SET LEADCHNL RV SENSING SENSITIVITY: 0.5 mV
MDC IDC STAT BRADY AP VS PERCENT: 4.9 %
MDC IDC STAT BRADY AS VP PERCENT: 1 %
MDC IDC STAT BRADY RA PERCENT PACED: 4.1 %
Pulse Gen Serial Number: 622431

## 2017-09-14 NOTE — Progress Notes (Signed)
EPIC Encounter for ICM Monitoring  Patient Name: Adam [name] is a 51 y.o. male Date: 09/14/2017 Primary Care Physican: Kirstie Peri, MD Primary Cardiologist: Branch Electrophysiologist: Allred Dry Weight:Last ICM weight 230lbs                                                              Attempted call to wife and no answer or answering machine.  Transmission reviewed.    [place]: Thoracic impedance normal but was abnormal suggesting fluid accumulation from 08/16/2017 to 09/03/2017 with a few days at baseline.  Prescribed dosage: Furosemide 40 mg take 1 tablet twice a day (med list reported patient is only taking once a day)  Recommendations: NONE - Unable to reach wife  Follow-up plan: ICM clinic phone appointment on 10/16/2017.  Office appointment scheduled 09/15/2017 with Dr. Wyline Mood.  Copy of ICM check sent to Dr. Johney Frame and Dr. Wyline Mood.   3 month ICM trend: 09/14/2017    1 Year ICM trend:       Karie Soda, RN 09/14/2017 11:24 AM

## 2017-09-14 NOTE — Telephone Encounter (Signed)
Remote ICM transmission received.  Attempted call to wife and no answer and no answering.

## 2017-09-15 ENCOUNTER — Encounter: Payer: Self-pay | Admitting: Cardiology

## 2017-09-15 ENCOUNTER — Ambulatory Visit (INDEPENDENT_AMBULATORY_CARE_PROVIDER_SITE_OTHER): Payer: Medicare HMO | Admitting: Cardiology

## 2017-09-15 VITALS — BP 102/68 | HR 75 | Ht 71.0 in | Wt 220.0 lb

## 2017-09-15 DIAGNOSIS — E782 Mixed hyperlipidemia: Secondary | ICD-10-CM | POA: Diagnosis not present

## 2017-09-15 DIAGNOSIS — I472 Ventricular tachycardia, unspecified: Secondary | ICD-10-CM

## 2017-09-15 DIAGNOSIS — I5022 Chronic systolic (congestive) heart failure: Secondary | ICD-10-CM | POA: Diagnosis not present

## 2017-09-15 DIAGNOSIS — I4892 Unspecified atrial flutter: Secondary | ICD-10-CM | POA: Diagnosis not present

## 2017-09-15 MED ORDER — FUROSEMIDE 40 MG PO TABS: ORAL_TABLET | ORAL | 3 refills | Status: DC

## 2017-09-15 NOTE — Progress Notes (Signed)
Clinical Summary Mr. Abrahamsen is a 51 y.o.male seen today for follow up of the following medical problems.    1. NICM/Chronic systolic HF 01/2013 echo LVEF 30-35%, cannot eval diastolic dysfunction. - cath 08/2010 patent coronaries - ICD followed by EP. History of VT, ICD indications include secondary prevention.   - recent high impedance on device check.  - reports SOB is variable. Can have some orthopnea at times. - can have some LE edema at times - denies any chest pain.  -  seen ER last month with pulmonary edema, discharged from ER. - home weights stable 215-219.  - limiting sodium intake.    2. History of VT - followed by EP. He is on sotalol, also has ICD - denies any recent palpitations, no syncope.    3. 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  - from [place] records discharge summary mentions patient medical history of afib, however I do not see a corresponding diagnosis in the [facility] record. The diagnosis of afib is listed in the intial H&P as well - device check 07/2015 showed 3 minute episode of aflutter   4. Hyperlipidemia - 01/2016 labs at [facility] showed TC 217 HDL 27 TG 128 LDL 164 - he is compliant with statin  5. Paroxysmal aflutter - no recent palpitatins, compliant with eliquis  6. SOB - can have some SOB, wheezing at times. Still with cough, nonproductive.  - cold 3 weeks ago.   7. Gout - followed by pcp   Past Medical History:  Diagnosis Date  . A-fib (HCC)   . Chronic systolic heart failure (HCC)   . CVA (cerebral infarction)   . Disability examination    Discussion of disability September, 2013  . Drug therapy    Sotalol started for VT storm  July, 2013  . Ejection fraction < 50%    Echo 06/05/12: Mild LVH, EF 20%, posterior severe HK, anterolateral severe HK, anterior severe HK, mid to apical septal AK, AK of the true apex, apical inferior HK,  restrictive physiology, trivial MR, moderate LAE, mildly reduced RV function, mild TR.  Marland Kitchen Headache(784.0)    Patient seen to have headaches from spironolactone.  Drug was stopped, August, 2012  . Hypokalemia    There was some decrease in potassium with his admission in July, 2013. We will watch this very carefully.  . ICD (implantable cardiac defibrillator) battery depletion 11/11/10   [employer], Dr.Allred  . 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 Mitral regurgitation   . NICM (nonischemic cardiomyopathy) (HCC)    Nonischemic / catheterization October, 2011, normal coronary arteries  . Renal insufficiency    Creatinine 1.5 (GFR greater than 50)  . Sleepiness    has some sleepiness during the day  . VT (ventricular tachycardia) (HCC)    VT storm 7/13 - Sotalol started     Allergies  Allergen Reactions  . Atorvastatin Other (See Comments)    headaches headaches     Current Outpatient Medications  Medication Sig Dispense Refill  . allopurinol (ZYLOPRIM) 300 MG tablet Take 1 tablet by mouth daily.    Marland Kitchen atorvastatin (LIPITOR) 80 MG tablet Take 80 mg by mouth at bedtime.  0  . benazepril (LOTENSIN) 5 MG tablet TAKE ONE TABLET BY MOUTH ONCE DAILY 30 tablet 3  . carvedilol (COREG) 6.25 MG tablet TAKE ONE TABLET BY MOUTH TWICE DAILY 60 tablet 0  .  ELIQUIS 5 MG TABS tablet Take 5 mg by mouth 2 (two) times daily.  0  . furosemide (LASIX) 40 MG tablet TAKE ONE TABLET BY MOUTH TWICE DAILY. (Patient taking differently: TAKE ONE TABLET BY MOUTH TWICE DAILY.  Wife reported 07/25/17 taking 1 tablet daily.) 60 tablet 4  . levETIRAcetam (KEPPRA) 500 MG tablet TAKE ONE TABLET BY MOUTH TWICE DAILY 180 tablet 1  . sotalol (BETAPACE) 120 MG tablet TAKE ONE TABLET BY MOUTH EVERY TWELVE HOURS. 60 tablet 3   No current facility-administered medications for this visit.      Past Surgical History:  Procedure Laterality Date  . CARDIAC DEFIBRILLATOR PLACEMENT  11/11/10   SJM Fortify DR ICD  implanted by Dr Johney Frame  . TEE WITH CARDIOVERSION  11/17/08   No LV clot     Allergies  Allergen Reactions  . Atorvastatin Other (See Comments)    headaches headaches      Family History  Problem Relation Age of Onset  . Heart attack Father 38       Multiple MIs  . Stroke Father   . Heart attack Brother   . Heart failure Brother        CHF  . Hypertension Mother   . Stroke Mother   . Heart attack Paternal Grandfather   . Colon cancer Neg Hx      Social History Mr. Littrel reports that  has never smoked. he has never used smokeless tobacco. Mr. Ilgenfritz reports that he drinks alcohol.   Review of Systems CONSTITUTIONAL: No weight loss, fever, chills, weakness or fatigue.  HEENT: Eyes: No visual loss, blurred vision, double vision or yellow sclerae.No hearing loss, sneezing, congestion, runny nose or sore throat.  SKIN: No rash or itching.  CARDIOVASCULAR: per hpi RESPIRATORY: per hpi GASTROINTESTINAL: No anorexia, nausea, vomiting or diarrhea. No abdominal pain or blood.  GENITOURINARY: No burning on urination, no polyuria NEUROLOGICAL: No headache, dizziness, syncope, paralysis, ataxia, numbness or tingling in the extremities. No change in bowel or bladder control.  MUSCULOSKELETAL: No muscle, back pain, joint pain or stiffness.  LYMPHATICS: No enlarged nodes. No history of splenectomy.  PSYCHIATRIC: No history of depression or anxiety.  ENDOCRINOLOGIC: No reports of sweating, cold or heat intolerance. No polyuria or polydipsia.  Marland Kitchen   Physical Examination Vitals:   09/15/17 1135 09/15/17 1138  BP: 100/64 102/68  Pulse: 75   SpO2: 95%    Vitals:   09/15/17 1135  Weight: 220 lb (99.8 kg)  Height: 5\' 11"  (1.803 m)    Gen: resting comfortably, no acute distress HEENT: no scleral icterus, pupils equal round and reactive, no palptable cervical adenopathy,  CV: RRR, no m/r/g, no jvd Resp: Clear to auscultation bilaterally GI: abdomen is soft, non-tender,  non-distended, normal bowel sounds, no hepatosplenomegaly MSK: extremities are warm, no edema.  Skin: warm, no rash Neuro:  no focal deficits Psych: appropriate affect   Diagnostic Studies 01/2013 echo Study Conclusions  - Left ventricle: The cavity size was normal. There was moderate concentric hypertrophy with thinning and akinesis of the posterior wall. LV Apical false tendon. There is posterior akinesis and inferoapical severe hypokinesis. The mid to distal anterior wall and septum is also severely hypokinetic. Systolic function was moderately to severely reduced. The estimated ejection fraction was in the range of 30% to 35%. The study is not technically sufficient to allow evaluation of LV diastolic function. - Aortic valve: Transvalvular velocity was within the normal range. There was no stenosis. Trace to mild  central regurgitation. - Mitral valve: Mildly thickened leaflets with minimal, bi-leafletlate systolic prolapse. Mild regurgitation. - Left atrium: LA Volume/ BSA = 30.536ml/m2 The atrium was mildly dilated. - Right ventricle: The cavity size was normal. Wall thickness was normal. AICD wirenoted in right ventricle. Systolic function was normal. - Right atrium: The atrium was normal in size. AICD wire noted in right atrium. - Tricuspid valve: Mild regurgitation. - Inferior vena cava: The vessel was normal in size; the respirophasic diameter changes were in the normal range (= 50%); findings are consistent with normal central venous pressure.   [facility]  A complete portable two-dimensional transthoracic echocardiogram with color  flow Doppler and Spectral Doppler was performed. Saline contrast injection  was performed.  The left ventricle is mildly dilated.  There is normal left ventricular wall thickness.  There is akinesis of the inferior, posterior, and lateral walls.    There is moderate diffuse hypokinesis of the remaining left ventricular  segments.  The left ventricular ejection fraction is markedly reduced (25-30%).  The left ventricular diastolic function is normal.  The aortic valve is not well visualized, but is grossly normal.  Injection of contrast documented no interatrial shunt .    01/2015 [facility] IMPRESSION:   1. Vasculature is unchanged and without stenosis or an aneurysm or cutoff sign. No hemorrhage.  2. Possible developing hypodensity in the left insular region.  3. Old infarct in the right occipital lobe.  4. Right upper lobe pneumonia.     Assessment and Plan   1. NICM/Chronic systolic HF -  He is tolerating just low dose ACE-I, we have not attempted converting to entresto due to this but may consider in the future.  - recent symptoms of fluid overload along with change in impedance of his ICD.  - we will increase lasix to 60mg  in AM and 40mg  in PM, check BMET/Mg in 2 weeks.  - with worsening CHF symptoms we will repeat echo, last study 01/2013  2. History of VT - ICD followed by EP, continue regular device checks. On sotalol followed by EP - asymptomatic, continue current meds  3. Hyperlipidemia - continue high dose statin s/p CVA - we will repeat lipid panel   4. Paroxysmal aflutter - CHADS2Vasc score 3, continue eliquis.  - asymptomatic, continue current meds     Antoine PocheJonathan F. Laquisha Northcraft, M.D.

## 2017-09-15 NOTE — Patient Instructions (Signed)
Medication Instructions:  INCREASE LASIX TO 60 MG IN THE MORNING & 40 MG IN THE EVENING   Labwork: 2 WEEKS BMET MAGNESIUM  FASTING LIPID (PLEASE DO NOT EAT OR DRINK ANYTHING FOR 8 HOURS PRIOR)  Testing/Procedures: Your physician has requested that you have an echocardiogram. Echocardiography is a painless test that uses sound waves to create images of your heart. It provides your doctor with information about the size and shape of your heart and how well your heart's chambers and valves are working. This procedure takes approximately one hour. There are no restrictions for this procedure.    Follow-Up: Your physician recommends that you schedule a follow-up appointment in: 2 MONTHS    Any Other Special Instructions Will Be Listed Below (If Applicable).     If you need a refill on your cardiac medications before your next appointment, please call your pharmacy.

## 2017-09-20 ENCOUNTER — Other Ambulatory Visit: Payer: Self-pay

## 2017-09-20 ENCOUNTER — Encounter: Payer: Self-pay | Admitting: Cardiology

## 2017-09-20 ENCOUNTER — Ambulatory Visit (INDEPENDENT_AMBULATORY_CARE_PROVIDER_SITE_OTHER): Payer: Medicare HMO

## 2017-09-20 DIAGNOSIS — I5022 Chronic systolic (congestive) heart failure: Secondary | ICD-10-CM

## 2017-09-20 LAB — ECHOCARDIOGRAM COMPLETE
CHL CUP DOP CALC LVOT VTI: 12.2 cm
CHL CUP MV DEC (S): 111
CHL CUP STROKE VOLUME: 53 mL
CHL CUP TV REG PEAK VELOCITY: 324 cm/s
E decel time: 111 msec
E/e' ratio: 9.45
FS: 15 % — AB (ref 28–44)
IVS/LV PW RATIO, ED: 0.83
LA diam end sys: 44 mm
LADIAMINDEX: 2 cm/m2
LASIZE: 44 mm
LAVOL: 112 mL
LAVOLA4C: 141 mL
LAVOLIN: 50.9 mL/m2
LDCA: 3.46 cm2
LV E/e'average: 9.45
LV PW d: 11.9 mm — AB (ref 0.6–1.1)
LV TDI E'MEDIAL: 5.51
LV dias vol index: 65 mL/m2
LV dias vol: 142 mL (ref 62–150)
LV sys vol: 89 mL — AB (ref 21–61)
LVEEMED: 9.45
LVELAT: 8.93 cm/s
LVOT diameter: 21 mm
LVOT peak vel: 71.7 cm/s
LVOTSV: 42 mL
LVSYSVOLIN: 41 mL/m2
Lateral S' vel: 7.8 cm/s
MV pk A vel: 37.8 m/s
MV pk E vel: 84.4 m/s
MVPG: 3 mmHg
P 1/2 time: 583 ms
RV TAPSE: 18.6 mm
RV sys press: 45 mmHg
Simpson's disk: 37
TDI e' lateral: 8.93
TRMAXVEL: 324 cm/s

## 2017-09-22 ENCOUNTER — Telehealth: Payer: Self-pay

## 2017-09-22 NOTE — Telephone Encounter (Signed)
Patient given results of echo.He states he feels "ok", "no changes", very slight SOB

## 2017-09-22 NOTE — Telephone Encounter (Signed)
-----   Message from Antoine Poche, MD sent at 09/22/2017  2:01 PM EST ----- Echo shows heart function remains weak and overall stable around 30% (normal is 50-60%). How are his symptoms doing?  Dominga Ferry MD

## 2017-09-29 ENCOUNTER — Other Ambulatory Visit: Payer: Self-pay | Admitting: Cardiology

## 2017-10-04 ENCOUNTER — Telehealth: Payer: Self-pay

## 2017-10-04 NOTE — Telephone Encounter (Signed)
Spoke with pt regarding shock from 7:00am, pt stated that he was in bed at this time. Pt reports compliance with Sotalol 120mg  every 12 hours. Pt stated that he had run out of his Coreg on Monday but had called Dr.Branch office to have it refilled, per med list refill sent over to pts pharmacy on 10/02/17. Confirmed that pharmacy it was sent to was pts preferred pharmacy. Advised pt to call pharmacy and pick up the mediation as soon as possible. Pt aware of driving restrictions x 6 months.

## 2017-10-04 NOTE — Telephone Encounter (Signed)
LVM on pts home regarding shock form 10/04/17 at 7:04am, also spoke with [name] ( ok per [facility]) ~ she stated that Adam [name] was not available informed her that we received a transmission was from his home monitor that needed to be discussed she stated that she would relay the message.

## 2017-10-05 ENCOUNTER — Telehealth: Payer: Self-pay | Admitting: Internal Medicine

## 2017-10-05 NOTE — Telephone Encounter (Signed)
Adam [name] is calling because they received a message that Adam [name] device shocked him while he was asleep on yesterday morning . Please call

## 2017-10-05 NOTE — Telephone Encounter (Signed)
Returned call to Mr. Grainer- he requests that I call his wife [name].  I call Mrs. Corine Shelter- she wanted a further explanation of what happened yesterday morning. I explained the arrhythmia and the device's therapy, that he should re-start his coreg (refills were sent in yesterday) and I advised her of his driving restrictions. She verbalizes understanding.

## 2017-10-16 ENCOUNTER — Ambulatory Visit (INDEPENDENT_AMBULATORY_CARE_PROVIDER_SITE_OTHER): Payer: Medicare HMO

## 2017-10-16 DIAGNOSIS — I5022 Chronic systolic (congestive) heart failure: Secondary | ICD-10-CM | POA: Diagnosis not present

## 2017-10-16 DIAGNOSIS — Z95810 Presence of automatic (implantable) cardiac defibrillator: Secondary | ICD-10-CM

## 2017-10-19 ENCOUNTER — Telehealth: Payer: Self-pay

## 2017-10-19 NOTE — Progress Notes (Signed)
EPIC Encounter for ICM Monitoring  Patient Name: Adam [name] is a 51 y.o. male Date: 10/19/2017 Primary Care Physican: Kirstie Peri, MD Primary Cardiologist: Branch Electrophysiologist: Allred Dry Weight:Last ICM weight 230lbs        Attempted call to wife and unable to reach.  Left detailed message regarding transmission.  Transmission reviewed.    Thoracic impedance normal today but was abnormal suggesting fluid accumulation from 10/04/2017 to 10/18/2017.  Prescribed dosage: Furosemide 40 mg take 60 mg every AM and 40 mg every PM.  (increased dosage at last OV with Dr Wyline Mood 09/15/17)  Recommendations: Left voice mail with ICM number and encouraged to call if experiencing any fluid symptoms.  Follow-up plan: ICM clinic phone appointment on 11/20/2017.  Office visit scheduled 11/21/2017 with Dr Wyline Mood  Copy of ICM check sent to Dr. Johney Frame and Dr. Wyline Mood.   Direct Trend Viewer shows through 10/18/2017.    3 month ICM trend: 10/16/2017    1 Year ICM trend:       Karie Soda, RN 10/19/2017 2:17 PM

## 2017-10-19 NOTE — Telephone Encounter (Signed)
Remote ICM transmission received.  Attempted call to wife and left detailed message per DPR regarding transmission and next ICM scheduled for 11/20/2017.  Advised to return call for any fluid symptoms or questions.

## 2017-11-17 ENCOUNTER — Other Ambulatory Visit: Payer: Self-pay | Admitting: Cardiology

## 2017-11-17 ENCOUNTER — Other Ambulatory Visit: Payer: Self-pay | Admitting: Internal Medicine

## 2017-11-20 ENCOUNTER — Ambulatory Visit (INDEPENDENT_AMBULATORY_CARE_PROVIDER_SITE_OTHER): Payer: Medicare HMO

## 2017-11-20 DIAGNOSIS — Z95810 Presence of automatic (implantable) cardiac defibrillator: Secondary | ICD-10-CM | POA: Diagnosis not present

## 2017-11-20 DIAGNOSIS — I5022 Chronic systolic (congestive) heart failure: Secondary | ICD-10-CM

## 2017-11-21 ENCOUNTER — Ambulatory Visit: Payer: Medicare HMO | Admitting: Cardiology

## 2017-11-21 NOTE — Progress Notes (Signed)
EPIC Encounter for ICM Monitoring  Patient Name: Adam [name] is a 52 y.o. male Date: 11/21/2017 Primary Care Physican: Monico Blitz, MD Primary Cardiologist: Branch Electrophysiologist: Allred Dry Weight:230lbs       Spoke with wife.  Heart Failure questions reviewed, pt asymptomatic.   Thoracic impedance abnormal suggesting fluid accumulation starting 11/18/17 and starting to trend toward baseline.  Prescribed dosage: Furosemide40 mg take 60 mg every AM and 40 mg every PM.  (increased dosage at last OV with Dr Harl Bowie 09/15/17)  Labs: 08/20/2017 Creatinine 1.02, BUN 9, Potassium 3.1, Sodium 141 03/08/2017 Creatinine 1.16, BUN 9, Potassium 3.5, Sodium 142, EGFR >60  02/21/2017 Creatinine 1.16, BUN 9, Potassium 3.6, Sodium 140, EGFR >60   Recommendations: No changes.  Advised to limit salt intake .  Encouraged to call for fluid symptoms.  Follow-up plan: ICM clinic phone appointment on 11/30/2017 to recheck fluid levels.  Office appointment scheduled 12/13/2017 with Dr. Harl Bowie.  Copy of ICM check sent to Dr. Harl Bowie and Dr. Rayann Heman for review.   3 month ICM trend: 11/21/2017    1 Year ICM trend:       Rosalene Billings, RN 11/21/2017 1:45 PM

## 2017-11-23 ENCOUNTER — Ambulatory Visit (INDEPENDENT_AMBULATORY_CARE_PROVIDER_SITE_OTHER): Payer: Medicare HMO | Admitting: *Deleted

## 2017-11-23 DIAGNOSIS — I428 Other cardiomyopathies: Secondary | ICD-10-CM

## 2017-11-23 NOTE — Progress Notes (Signed)
Remote ICD transmission.   

## 2017-11-24 ENCOUNTER — Encounter: Payer: Self-pay | Admitting: Cardiology

## 2017-11-30 ENCOUNTER — Ambulatory Visit (INDEPENDENT_AMBULATORY_CARE_PROVIDER_SITE_OTHER): Payer: Self-pay

## 2017-11-30 ENCOUNTER — Telehealth: Payer: Self-pay | Admitting: Cardiology

## 2017-11-30 DIAGNOSIS — I5022 Chronic systolic (congestive) heart failure: Secondary | ICD-10-CM

## 2017-11-30 DIAGNOSIS — Z95810 Presence of automatic (implantable) cardiac defibrillator: Secondary | ICD-10-CM

## 2017-11-30 NOTE — Telephone Encounter (Signed)
Spoke with pt and reminded pt of remote transmission that is due today. Pt verbalized understanding.   

## 2017-12-04 NOTE — Progress Notes (Signed)
EPIC Encounter for ICM Monitoring  Patient Name: Adam [name] is a 52 y.o. male Date: 12/04/2017 Primary Care Physican: Monico Blitz, MD Primary Cardiologist: Branch Electrophysiologist: Allred Dry Weight:220lbs                                                  Spoke with wife.  Heart Failure questions reviewed, pt has a cough and using extra pillows at night to help with coughing and breathing.  Last week had 2 visits one related to a cough and other related hiccups.  Patient thought the cough was related to the flu because he felt so bad.    Thoracic impedance normal today but was abnormal suggesting fluid accumulation from 11/18/2016 to 12/03/2016 with exception of 1 day at baseline.  Prescribed dosage: Furosemide40 mg take 60 mgevery AM and 40 mg every PM.   Labs: 08/20/2017 Creatinine 1.02, BUN 9, Potassium 3.1, Sodium 141 03/08/2017 Creatinine 1.16, BUN 9, Potassium 3.5, Sodium 142, EGFR >60  02/21/2017 Creatinine 1.16, BUN 9, Potassium 3.6, Sodium 140, EGFR >60  Recommendations:  Advised to take Furosemide 40 mg two tablets (80 mg total) and 1.5 tablets in PM (60 mg total) x 3 days and after 3rd day return to prescribed dosage of 60 mg every AM and 40 mg every PM.  Advised he can return to prescribed dosage prior to 3rd day if cough resolves and he is able to sleep without extra pillows.  Follow-up plan: ICM clinic phone appointment on 12/12/2017 to recheck fluid levels.  Office visit with Dr Harl Bowie 12/13/2017.  Copy of ICM check sent to Dr. Rayann Heman and Dr Harl Bowie for review.   3 month ICM trend: 12/03/2017    1 Year ICM trend:       Rosalene Billings, RN 12/04/2017 2:41 PM

## 2017-12-07 ENCOUNTER — Other Ambulatory Visit: Payer: Self-pay | Admitting: Neurology

## 2017-12-11 LAB — CUP PACEART REMOTE DEVICE CHECK
Battery Voltage: 2.87 V
Brady Statistic AP VP Percent: 1 %
Brady Statistic AP VS Percent: 3.6 %
Brady Statistic AS VP Percent: 1 %
Brady Statistic RV Percent Paced: 1 %
HighPow Impedance: 55 Ohm
HighPow Impedance: 55 Ohm
Implantable Lead Implant Date: 20120105
Implantable Lead Location: 753860
Implantable Lead Model: 7122
Lead Channel Impedance Value: 360 Ohm
Lead Channel Pacing Threshold Amplitude: 0.75 V
Lead Channel Pacing Threshold Pulse Width: 0.4 ms
Lead Channel Sensing Intrinsic Amplitude: 2.9 mV
Lead Channel Sensing Intrinsic Amplitude: 6 mV
Lead Channel Setting Pacing Amplitude: 2 V
Lead Channel Setting Sensing Sensitivity: 0.5 mV
MDC IDC LEAD IMPLANT DT: 20120105
MDC IDC LEAD LOCATION: 753859
MDC IDC MSMT BATTERY REMAINING LONGEVITY: 39 mo
MDC IDC MSMT BATTERY REMAINING PERCENTAGE: 34 %
MDC IDC MSMT LEADCHNL RV IMPEDANCE VALUE: 390 Ohm
MDC IDC MSMT LEADCHNL RV PACING THRESHOLD AMPLITUDE: 1 V
MDC IDC MSMT LEADCHNL RV PACING THRESHOLD PULSEWIDTH: 0.4 ms
MDC IDC PG IMPLANT DT: 20120105
MDC IDC PG SERIAL: 622431
MDC IDC SESS DTM: 20190115082318
MDC IDC SET LEADCHNL RV PACING AMPLITUDE: 2.5 V
MDC IDC SET LEADCHNL RV PACING PULSEWIDTH: 0.4 ms
MDC IDC STAT BRADY AS VS PERCENT: 93 %
MDC IDC STAT BRADY RA PERCENT PACED: 2.9 %

## 2017-12-12 ENCOUNTER — Telehealth: Payer: Self-pay | Admitting: Cardiology

## 2017-12-12 NOTE — Telephone Encounter (Signed)
LMOVM reminding pt to send remote transmission.   

## 2017-12-13 ENCOUNTER — Encounter: Payer: Self-pay | Admitting: *Deleted

## 2017-12-13 ENCOUNTER — Encounter: Payer: Self-pay | Admitting: Cardiology

## 2017-12-13 ENCOUNTER — Ambulatory Visit: Payer: Medicare HMO | Admitting: Cardiology

## 2017-12-13 ENCOUNTER — Other Ambulatory Visit: Payer: Self-pay

## 2017-12-13 VITALS — BP 100/62 | HR 68 | Ht 71.0 in | Wt 222.0 lb

## 2017-12-13 DIAGNOSIS — I472 Ventricular tachycardia, unspecified: Secondary | ICD-10-CM

## 2017-12-13 DIAGNOSIS — E782 Mixed hyperlipidemia: Secondary | ICD-10-CM | POA: Diagnosis not present

## 2017-12-13 DIAGNOSIS — I428 Other cardiomyopathies: Secondary | ICD-10-CM

## 2017-12-13 DIAGNOSIS — I5022 Chronic systolic (congestive) heart failure: Secondary | ICD-10-CM

## 2017-12-13 DIAGNOSIS — I4892 Unspecified atrial flutter: Secondary | ICD-10-CM | POA: Diagnosis not present

## 2017-12-13 MED ORDER — SACUBITRIL-VALSARTAN 24-26 MG PO TABS: 1.0000 | ORAL_TABLET | Freq: Two times a day (BID) | ORAL | 3 refills | Status: DC

## 2017-12-13 NOTE — Patient Instructions (Signed)
Your physician recommends that you schedule a follow-up appointment in: 4 MONTHS WITH DR [facility]  Your physician has recommended you make the following change in your medication:   STOP BENAZEPRIL   2 DAYS AFTER STOPPING BENAZEPRIL START ENTRESTO 24/26 MG TWICE DAILY  Thank you for choosing [facility]!!

## 2017-12-13 NOTE — Progress Notes (Signed)
Clinical Summary Mr. Harbaugh is a 52 y.o.male seen today for follow up of the following medical problems.    1. NICM/Chronic systolic HF 01/2013 echo LVEF 30-35%, cannot eval diastolic dysfunction. - cath 08/2010 patent coronaries - repeat echo 09/2017 LVEF 25-30% - ICD followed by EP. History of VT, ICD indications include secondary prevention.  - recent issues with volume overload, we increased his lasix to 60mg  in AM and 40mg  in PM. He was to have repeat labs after the change but don't see results in epic - elevated throcaic impedance Jan 12 to 27, normal on day of check. Recs at that time to take lasix 80mg  in am and 60mg  in pm x 3 days, then back to 60mg  in AM and 40mg  in PM.   - can have some gout flares with diuretics. Back down to 60mg  in AM and 40mg  in PM of lasix - no recent edema, no SOB/DOE. - weights trending down at home, down to 222 lbs.  - compliant with meds, limiting sodium intake. Avoid NSAIDs.   2. History of VT - followed by EP. He is on sotalol, also has ICD  - no recent palpitations  3. 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   4. Paroxysmal aflutter - on coreg, eliquis. Happens to be on sotalol for prior VT as well  - no bleeding on eliquis   4. Hyperlipidemia - 01/2016 labs at [facility] showed TC 217 HDL 27 TG 128 LDL 164 - compliant with meds   5. Paroxysmal aflutter - no recent palpitatins, compliant with eliquis    Past Medical History:  Diagnosis Date  . A-fib (HCC)   . Chronic systolic heart failure (HCC)   . CVA (cerebral infarction)   . Disability examination    Discussion of disability September, 2013  . Drug therapy    Sotalol started for VT storm  July, 2013  . Ejection fraction < 50%    Echo 06/05/12: Mild LVH, EF 20%, posterior severe HK, anterolateral severe HK, anterior severe HK, mid to apical septal AK, AK of the true apex,  apical inferior HK, restrictive physiology, trivial MR, moderate LAE, mildly reduced RV function, mild TR.  Marland Kitchen Headache(784.0)    Patient seen to have headaches from spironolactone.  Drug was stopped, August, 2012  . Hypokalemia    There was some decrease in potassium with his admission in July, 2013. We will watch this very carefully.  . ICD (implantable cardiac defibrillator) battery depletion 11/11/10   [employer], Dr.Allred  . 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 Mitral regurgitation   . NICM (nonischemic cardiomyopathy) (HCC)    Nonischemic / catheterization October, 2011, normal coronary arteries  . Renal insufficiency    Creatinine 1.5 (GFR greater than 50)  . Sleepiness    has some sleepiness during the day  . VT (ventricular tachycardia) (HCC)    VT storm 7/13 - Sotalol started     Allergies  Allergen Reactions  . Atorvastatin Other (See Comments)    headaches headaches     Current Outpatient Medications  Medication Sig Dispense Refill  . allopurinol (ZYLOPRIM) 300 MG tablet Take 1 tablet by mouth daily.    Marland Kitchen atorvastatin (LIPITOR) 80 MG tablet Take 80 mg by mouth at bedtime.  0  . benazepril (LOTENSIN) 5 MG tablet TAKE ONE TABLET BY MOUTH DAILY. 30 tablet 3  .  carvedilol (COREG) 6.25 MG tablet TAKE ONE TABLET BY MOUTH TWICE DAILY 60 tablet 0  . ELIQUIS 5 MG TABS tablet Take 5 mg by mouth 2 (two) times daily.  0  . furosemide (LASIX) 40 MG tablet Take 60 mg in the A.M. & 40 MG in the P.M. 225 tablet 3  . levETIRAcetam (KEPPRA) 500 MG tablet TAKE ONE TABLET BY MOUTH TWICE DAILY 180 tablet 1  . sotalol (BETAPACE) 120 MG tablet TAKE ONE TABLET BY MOUTH EVERY TWELVE HOURS. 60 tablet 6   No current facility-administered medications for this visit.      Past Surgical History:  Procedure Laterality Date  . CARDIAC DEFIBRILLATOR PLACEMENT  11/11/10   SJM Fortify DR ICD implanted by Dr Johney Frame  . COLONOSCOPY N/A 07/13/2016   Procedure: COLONOSCOPY;  Surgeon:  Corbin Ade, MD;  Location: [facility];  Service: Endoscopy;  Laterality: N/A;  12:00 pm - pt can't move up due to transportation  . TEE WITH CARDIOVERSION  11/17/08   No LV clot     Allergies  Allergen Reactions  . Atorvastatin Other (See Comments)    headaches headaches      Family History  Problem Relation Age of Onset  . Heart attack Father 31       Multiple MIs  . Stroke Father   . Heart attack Brother   . Heart failure Brother        CHF  . Hypertension Mother   . Stroke Mother   . Heart attack Paternal Grandfather   . Colon cancer Neg Hx      Social History Mr. Sump reports that  has never smoked. he has never used smokeless tobacco. Mr. Dethlefsen reports that he drinks alcohol.   Review of Systems CONSTITUTIONAL: No weight loss, fever, chills, weakness or fatigue.  HEENT: Eyes: No visual loss, blurred vision, double vision or yellow sclerae.No hearing loss, sneezing, congestion, runny nose or sore throat.  SKIN: No rash or itching.  CARDIOVASCULAR: per hpi RESPIRATORY: per hpi  GASTROINTESTINAL: No anorexia, nausea, vomiting or diarrhea. No abdominal pain or blood.  GENITOURINARY: No burning on urination, no polyuria NEUROLOGICAL: No headache, dizziness, syncope, paralysis, ataxia, numbness or tingling in the extremities. No change in bowel or bladder control.  MUSCULOSKELETAL: No muscle, back pain, joint pain or stiffness.  LYMPHATICS: No enlarged nodes. No history of splenectomy.  PSYCHIATRIC: No history of depression or anxiety.  ENDOCRINOLOGIC: No reports of sweating, cold or heat intolerance. No polyuria or polydipsia.  Marland Kitchen   Physical Examination Vitals:   12/13/17 1039  BP: 100/62  Pulse: 68  SpO2: 97%   Vitals:   12/13/17 1039  Weight: 222 lb (100.7 kg)  Height: 5\' 11"  (1.803 m)    Gen: resting comfortably, no acute distress HEENT: no scleral icterus, pupils equal round and reactive, no palptable cervical adenopathy,  CV: RRR, no  m/r/g, no jvd Resp: Clear to auscultation bilaterally GI: abdomen is soft, non-tender, non-distended, normal bowel sounds, no hepatosplenomegaly MSK: extremities are warm, no edema.  Skin: warm, no rash Neuro:  no focal deficits Psych: appropriate affect   Diagnostic Studies 01/2013 echo Study Conclusions  - Left ventricle: The cavity size was normal. There was moderate concentric hypertrophy with thinning and akinesis of the posterior wall. LV Apical false tendon. There is posterior akinesis and inferoapical severe hypokinesis. The mid to distal anterior wall and septum is also severely hypokinetic. Systolic function was moderately to severely reduced. The estimated ejection fraction was  in the range of 30% to 35%. The study is not technically sufficient to allow evaluation of LV diastolic function. - Aortic valve: Transvalvular velocity was within the normal range. There was no stenosis. Trace to mild central regurgitation. - Mitral valve: Mildly thickened leaflets with minimal, bi-leafletlate systolic prolapse. Mild regurgitation. - Left atrium: LA Volume/ BSA = 30.54ml/m2 The atrium was mildly dilated. - Right ventricle: The cavity size was normal. Wall thickness was normal. AICD wirenoted in right ventricle. Systolic function was normal. - Right atrium: The atrium was normal in size. AICD wire noted in right atrium. - Tricuspid valve: Mild regurgitation. - Inferior vena cava: The vessel was normal in size; the respirophasic diameter changes were in the normal range (= 50%); findings are consistent with normal central venous pressure.   01/2015 Echo [facility] Interpretation Summary  A complete portable two-dimensional transthoracic echocardiogram with color  flow Doppler and Spectral Doppler was performed. Saline contrast injection  was performed.  The left ventricle is mildly dilated.  There is normal left ventricular  wall thickness.  There is akinesis of the inferior, posterior, and lateral walls.  There is moderate diffuse hypokinesis of the remaining left ventricular  segments.  The left ventricular ejection fraction is markedly reduced (25-30%).  The left ventricular diastolic function is normal.  The aortic valve is not well visualized, but is grossly normal.  Injection of contrast documented no interatrial shunt .    01/2015 [facility] IMPRESSION:   1. Vasculature is unchanged and without stenosis or an aneurysm or cutoff sign. No hemorrhage.  2. Possible developing hypodensity in the left insular region.  3. Old infarct in the right occipital lobe.  4. Right upper lobe pneumonia.   09/2017 echo Study Conclusions  - Left ventricle: The cavity size was mildly dilated. Wall   thickness was normal. Systolic function was severely reduced. The   estimated ejection fraction was in the range of 25% to 30%.   Diastolic function is abnormal, indeterminant grade. - Regional wall motion abnormality: Akinesis of the apical septal   and apical myocardium; hypokinesis of the mid inferoseptal, mid   inferior, mid inferolateral, basal-mid anterolateral, and apical   lateral myocardium. - Aortic valve: Mildly calcified annulus. Trileaflet; mildly   thickened leaflets. There was mild regurgitation. Valve area   (VTI): 2.32 cm^2. Valve area (Vmax): 2.36 cm^2. Valve area   (Vmean): 2.32 cm^2. - Mitral valve: Mildly calcified annulus. Mildly thickened leaflets   . There was mild regurgitation. - Left atrium: The atrium was severely dilated. - Right atrium: The atrium was moderately dilated. - Technically adequate study.    Assessment and Plan  1. NICM/Chronic systolic HF -  appears euvolemic, no recent symptoms - stop ACE-I, wait 48 hrs and then start entresto 24/26mg  bid.   2. History of VT - ICD followed by EP, continue regular device checks. On sotalol followed by EP - no  recent issues, continue to monitor.   3. Hyperlipidemia - continue statin - request pcp labs   4. Paroxysmal aflutter - CHADS2Vasc score 3, continue eliquis.  - no symptoms, continue current meds    F/u 4 months   Antoine Poche, M.D.,

## 2017-12-14 NOTE — Progress Notes (Signed)
  No ICM remote transmission received for 12/12/2017 and next ICM transmission scheduled for 12/21/2017.

## 2017-12-16 ENCOUNTER — Encounter: Payer: Self-pay | Admitting: Cardiology

## 2017-12-19 ENCOUNTER — Telehealth: Payer: Self-pay | Admitting: *Deleted

## 2017-12-19 NOTE — Telephone Encounter (Signed)
PA for Entersto approved by [facility] through 12/19/2019 - approval for sent for scanning

## 2017-12-21 ENCOUNTER — Ambulatory Visit (INDEPENDENT_AMBULATORY_CARE_PROVIDER_SITE_OTHER): Payer: Medicare HMO

## 2017-12-21 DIAGNOSIS — I5022 Chronic systolic (congestive) heart failure: Secondary | ICD-10-CM

## 2017-12-21 DIAGNOSIS — Z95810 Presence of automatic (implantable) cardiac defibrillator: Secondary | ICD-10-CM

## 2017-12-21 NOTE — Progress Notes (Signed)
EPIC Encounter for ICM Monitoring  Patient Name: Adam [name] is a 52 y.o. male Date: 12/21/2017 Primary Care Physican: Monico Blitz, MD Primary Cardiologist: Branch Electrophysiologist: Allred Dry Weight: wife unsure of weight        Spoke with wife.Heart Failure questions reviewed, pt asymptomatic.   Thoracic impedance abnormal suggesting fluid accumulation since 12/15/2017.  Prescribed dosage: Furosemide40 mg take 1.5 tablets (60 mg total)every AM and 40 mg every PM.   Labs: 11/08/2017 Creatinine 1.31, BUN 10, Potassium 4.3, Sodium 141, EGFR 61-72 10/14/2018Creatinine 1.02, BUN9, Potassium3.1, Sodium141 03/08/2017 Creatinine1.16, BUN9, Potassium3.5, Sodium142, EGFR>60  02/21/2017 Creatinine1.16, BUN9, Potassium3.6, Sodium140, EGFR>60  Recommendations:        Advised will call back if Dr. Harl Bowie has any recommendations.   Encouraged to call for fluid symptoms.  Follow-up plan: ICM clinic phone appointment on 01/01/2018 to recheck fluid levels.    Copy of ICM check sent to Dr. Harl Bowie and Dr. Rayann Heman for review and recommendations if needed.   3 month ICM trend: 12/21/2017    1 Year ICM trend:       Rosalene Billings, RN 12/21/2017 3:36 PM

## 2017-12-22 NOTE — Progress Notes (Signed)
  Received: Yesterday  Message Contents  Hillis Range, MD  Short, Josephine Igo, RN        Looks worrisome. May be best to increase lasix x 2 days

## 2017-12-22 NOTE — Progress Notes (Signed)
Call to wife and advised Dr Johney Frame recommended to increase Lasix.  Advised to take Furosemide 40 mg 2 tablets (80 mg total) in AM and 2 tablets (80 mg total) in PM x 2 days.  After 2nd day, return to prescribed dosage of 1.5 tablets (60 mg total) every AM and 1 tablet (40 mg total)in PM. She verbalized understanding.

## 2018-01-01 ENCOUNTER — Telehealth: Payer: Self-pay | Admitting: Cardiology

## 2018-01-01 ENCOUNTER — Ambulatory Visit (INDEPENDENT_AMBULATORY_CARE_PROVIDER_SITE_OTHER): Payer: Self-pay

## 2018-01-01 DIAGNOSIS — I5022 Chronic systolic (congestive) heart failure: Secondary | ICD-10-CM

## 2018-01-01 DIAGNOSIS — Z95810 Presence of automatic (implantable) cardiac defibrillator: Secondary | ICD-10-CM

## 2018-01-01 NOTE — Telephone Encounter (Signed)
LMOVM reminding pt to send remote transmission.   

## 2018-01-02 ENCOUNTER — Telehealth: Payer: Self-pay

## 2018-01-02 NOTE — Telephone Encounter (Signed)
Remote ICM transmission received.  Attempted call to wife and left detailed message per DPR regarding transmission and next ICM scheduled for 01/22/2018.  Advised to return call for any fluid symptoms or questions.

## 2018-01-02 NOTE — Progress Notes (Signed)
EPIC Encounter for ICM Monitoring  Patient Name: Adam [name] is a 52 y.o. male Date: 01/02/2018 Primary Care Physican: Monico Blitz, MD Primary Cardiologist: Branch Electrophysiologist: Allred Dry Weight: wife unsure of weight   Attempted call to wife and unable to reach.  Left detailed message regarding transmission.  Transmission reviewed.    Thoracic impedance returned to normal after increasing Lasix x 2 days.  Prescribed dosage: Furosemide40 mg take 1.5 tablets (60 mg total)every AM and 40 mg every PM.   Labs: 11/08/2017 Creatinine 1.31, BUN 10, Potassium 4.3, Sodium 141, EGFR 61-72 10/14/2018Creatinine 1.02, BUN9,   Potassium3.1, Sodium141 03/08/2017 Creatinine1.16, BUN9,   Potassium3.5, Sodium142, EGFR>60  02/21/2017 Creatinine1.16, BUN9,   Potassium3.6, Sodium140, EGFR>60  Recommendations: Left voice mail with ICM number and encouraged to call if experiencing any fluid symptoms.  Follow-up plan: ICM clinic phone appointment on 01/22/2018.  Office appointment scheduled 02/09/2018 with Dr. Rayann Heman.  Copy of ICM check sent to Dr. Rayann Heman.   3 month ICM trend: 01/01/2018    1 Year ICM trend:       Rosalene Billings, RN 01/02/2018 10:25 AM

## 2018-01-18 ENCOUNTER — Ambulatory Visit (INDEPENDENT_AMBULATORY_CARE_PROVIDER_SITE_OTHER): Payer: Medicare HMO | Admitting: Neurology

## 2018-01-18 ENCOUNTER — Encounter: Payer: Self-pay | Admitting: Neurology

## 2018-01-18 VITALS — BP 82/60 | HR 63 | Wt 224.4 lb

## 2018-01-18 DIAGNOSIS — I4891 Unspecified atrial fibrillation: Secondary | ICD-10-CM | POA: Diagnosis not present

## 2018-01-18 DIAGNOSIS — R569 Unspecified convulsions: Secondary | ICD-10-CM | POA: Diagnosis not present

## 2018-01-18 DIAGNOSIS — I639 Cerebral infarction, unspecified: Secondary | ICD-10-CM | POA: Diagnosis not present

## 2018-01-18 MED ORDER — LEVETIRACETAM 500 MG PO TABS: 500.0000 mg | ORAL_TABLET | Freq: Two times a day (BID) | ORAL | 3 refills | Status: DC

## 2018-01-18 NOTE — Patient Instructions (Signed)
Continue levetiracetam 500mg  twice daily Continue Eliquis for stroke prevention Continue atorvastatin for cholesterol Follow up in one year.

## 2018-01-18 NOTE — Progress Notes (Signed)
NEUROLOGY FOLLOW UP OFFICE NOTE  Adam [name] [id number]  HISTORY OF PRESENT ILLNESS: Adam [name] with CKD stage 3, atrial fibrillation status post implantable pacemaker, OSA, gout, and CHF who follows up for probably cardioembolic stroke and symptomatic isolated seizure secondary to stroke.  He is accompanied by his significant other who supplements history.   UPDATE: He is on Adam [name] for secondary stroke prevention.  He is also on atorvastatin 80mg  daily. He continues on Keppra 500mg  twice daily.  He is doing well.  He has not had any recurrent seizure or stroke.   HISTORY: He was admitted to [name] from 01/15/16 to 01/19/16 for stroke.  He presented with right sided hemiplegia and global aphasia on day of admission.  He was first taken to [name] where he had a NIHSS of 15 and CT of head revealed left MCA territory ischemic stroke.  He received IV tPA and was then transferred to [name].  On arrival, NIHSS was 3.  He has atrial fibrillation with pacemaker but was not on anticoagulation.  He was started on aspirin.  Telemetry had revealed atrial fibrillation.  CXR showed bilateral infiltrate and edema, which improved on Lasix.  Echocardiogram revealed cardiomyopathy with EF 25-30% and no PFO.  Hgb A1c from 01/10/16 was 6.2.  LDL was 164.  He was subsequently discharged on [name] to the [name].  On 01/19/16, he was readmitted to the hospital for evaluation of possible seizure.  He was in the bathroom when family heard a thump.  When his family entered the bathroom, he had fallen backwards, leaning on the wall and exhibited stiffness of limbs with eyes rolled back and was unresponsive for a few minutes.  He immediately recovered.  Repeat head CT that day again revealed recent left MCA stroke and remote right PCA stroke but no new findings.  EEG was limited due to significant electrode and motion artifact, but the most  readable segments were reportedly normal.  PAST MEDICAL HISTORY: Past Medical History:  Diagnosis Date  . A-fib (HCC)   . Chronic systolic heart failure (HCC)   . CVA (cerebral infarction)   . Disability examination    Discussion of disability September, 2013  . Drug therapy    Sotalol started for VT storm  July, 2013  . Ejection fraction < 50%    Echo 06/05/12: Mild LVH, EF 20%, posterior severe HK, anterolateral severe HK, anterior severe HK, mid to apical septal AK, AK of the true apex, apical inferior HK, restrictive physiology, trivial MR, moderate LAE, mildly reduced RV function, mild TR.  Marland Kitchen Headache(784.0)    Patient seen to have headaches from spironolactone.  Drug was stopped, August, 2012  . Hypokalemia    There was some decrease in potassium with his admission in July, 2013. We will watch this very carefully.  . ICD (implantable cardiac defibrillator) battery depletion 11/11/10   Adam [name], Adam [name]  . 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 Mitral regurgitation   . NICM (nonischemic cardiomyopathy) (HCC)    Nonischemic / catheterization October, 2011, normal coronary arteries  . Renal insufficiency    Creatinine 1.5 (GFR greater than 50)  . Sleepiness    has some sleepiness during the day  . VT (ventricular tachycardia) (HCC)    VT storm 7/13 - Sotalol started    MEDICATIONS: Current Outpatient Medications on File Prior to Visit  Medication Sig Dispense Refill  . allopurinol (  ZYLOPRIM) 300 MG tablet Take 1 tablet by mouth daily.    Marland Kitchen atorvastatin (LIPITOR) 80 MG tablet Take 80 mg by mouth at bedtime.  0  . carvedilol (COREG) 6.25 MG tablet TAKE ONE TABLET BY MOUTH TWICE DAILY 60 tablet 0  . Adam [name] 5 MG TABS tablet Take 5 mg by mouth 2 (two) times daily.  0  . furosemide (LASIX) 40 MG tablet Take 60 mg in the A.M. & 40 MG in the P.M. 225 tablet 3  . sacubitril-valsartan (ENTRESTO) 24-26 MG Take 1 tablet by mouth 2 (two) times daily. 60 tablet 3  .  sotalol (BETAPACE) 120 MG tablet TAKE ONE TABLET BY MOUTH EVERY TWELVE HOURS. 60 tablet 6   No current facility-administered medications on file prior to visit.     ALLERGIES: Allergies  Allergen Reactions  . Atorvastatin Other (See Comments)    headaches headaches    FAMILY HISTORY: Family History  Problem Relation Age of Onset  . Heart attack Father 57       Multiple MIs  . Stroke Father   . Heart attack Brother   . Heart failure Brother        CHF  . Hypertension Mother   . Stroke Mother   . Heart attack Paternal Grandfather   . Colon cancer Neg Hx     SOCIAL HISTORY: Social History   Socioeconomic History  . Marital status: Single    Spouse name: Not on file  . Number of children: 5  . Years of education: Not on file  . Highest education level: Not on file  Social Needs  . Financial resource strain: Not on file  . Food insecurity - worry: Not on file  . Food insecurity - inability: Not on file  . Transportation needs - medical: Not on file  . Transportation needs - non-medical: Not on file  Occupational History  . Occupation: disabled  Tobacco Use  . Smoking status: Never Smoker  . Smokeless tobacco: Never Used  Substance and Sexual Activity  . Alcohol use: Yes    Alcohol/week: 0.0 oz    Comment: OCCASIONAL  (NOT HEAVY)  . Drug use: No  . Sexual activity: Not on file  Other Topics Concern  . Not on file  Social History Narrative   Lives in [place], [place] with fiance.   No regular exercise.    REVIEW OF SYSTEMS: Constitutional: No fevers, chills, or sweats, no generalized fatigue, change in appetite Eyes: No visual changes, double vision, eye pain Ear, nose and throat: No hearing loss, ear pain, nasal congestion, sore throat Cardiovascular: No chest pain, palpitations Respiratory:  No shortness of breath at rest or with exertion, wheezes GastrointestinaI: No nausea, vomiting, diarrhea, abdominal pain, fecal incontinence Genitourinary:  No dysuria,  urinary retention or frequency Musculoskeletal:  No neck pain, back pain Integumentary: No rash, pruritus, skin lesions Neurological: as above Psychiatric: No depression, insomnia, anxiety Endocrine: No palpitations, fatigue, diaphoresis, mood swings, change in appetite, change in weight, increased thirst Hematologic/Lymphatic:  No purpura, petechiae. Allergic/Immunologic: no itchy/runny eyes, nasal congestion, recent allergic reactions, rashes  PHYSICAL EXAM: Vitals:   01/18/18 1044  BP: (!) 82/60  Pulse: 63  SpO2: 97%   General: No acute distress.  Patient appears well-groomed.  normal body habitus. Head:  Normocephalic/atraumatic Eyes:  Fundi examined but not visualized Neck: supple, no paraspinal tenderness, full range of motion Heart:  Regular rate and rhythm Lungs:  Clear to auscultation bilaterally Back: No paraspinal tenderness Neurological Exam: alert  and oriented to person, place, and time. Attention span and concentration intact, recent and remote memory intact, fund of knowledge intact.  Speech fluent and not dysarthric, language intact.  CN II-XII intact. Bulk and tone normal, muscle strength 5/5 throughout.  Sensation to light touch  intact.  Deep tendon reflexes 2+ throughout.  Finger to nose testing intact.  Gait normal, Romberg negative.  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  Continue Keppra 500mg  twice daily (refilled) 2.  Continue Adam [name] for secondary stroke prevention as managed by cardiology 3.  Continue atorvastatin as managed by PCP (LDL goal should be less than 70) 4.  Follow up in one year or as needed.  17 minutes spent face to face with patient, over 50% spent discussing management.  Shon Millet, DO  CC: Dr. Sherryll Burger

## 2018-01-22 ENCOUNTER — Ambulatory Visit (INDEPENDENT_AMBULATORY_CARE_PROVIDER_SITE_OTHER): Payer: Medicare HMO

## 2018-01-22 DIAGNOSIS — Z95810 Presence of automatic (implantable) cardiac defibrillator: Secondary | ICD-10-CM

## 2018-01-22 DIAGNOSIS — I5022 Chronic systolic (congestive) heart failure: Secondary | ICD-10-CM

## 2018-01-23 NOTE — Progress Notes (Signed)
EPIC Encounter for ICM Monitoring  Patient Name: Adam [name] is a 52 y.o. male Date: 01/23/2018 Primary Care Physican: Monico Blitz, MD Primary Cardiologist: Branch Electrophysiologist: Allred Dry Weight: wife unsure of weight  Spoke to wife.   Heart Failure questions reviewed, pt asymptomatic.   Thoracic impedance normal.  Prescribed dosage: Furosemide40 mg take1.5 tablets (60 mgtotal)every AM and 40 mg every PM.   Labs: 11/10/2017 Creatinine 1.31, BUN 10, Potassium 4.3, Sodium 141, EGFR 61-72 10/14/2018Creatinine 1.02, BUN9,   Potassium3.1, Sodium141 03/08/2017 Creatinine1.16, BUN9,   Potassium3.5, Sodium142, EGFR>60  02/21/2017 Creatinine1.16, BUN9,   Potassium3.6, Sodium140, EGFR>60  Recommendations: No changes.   Encouraged to call for fluid symptoms.  Follow-up plan: ICM clinic phone appointment on 03/12/2018 since he has an office appointment scheduled 02/09/2018 with Dr. Rayann Heman.  Copy of ICM check sent to Dr. Rayann Heman.   3 month ICM trend: 01/22/2018    1 Year ICM trend:       Rosalene Billings, RN 01/23/2018 11:53 AM

## 2018-02-09 ENCOUNTER — Ambulatory Visit (INDEPENDENT_AMBULATORY_CARE_PROVIDER_SITE_OTHER): Payer: Medicare HMO | Admitting: Internal Medicine

## 2018-02-09 ENCOUNTER — Encounter: Payer: Self-pay | Admitting: Internal Medicine

## 2018-02-09 VITALS — BP 82/60 | HR 56 | Ht 70.0 in | Wt 220.0 lb

## 2018-02-09 DIAGNOSIS — I472 Ventricular tachycardia, unspecified: Secondary | ICD-10-CM

## 2018-02-09 DIAGNOSIS — I5022 Chronic systolic (congestive) heart failure: Secondary | ICD-10-CM

## 2018-02-09 MED ORDER — IRBESARTAN 75 MG PO TABS: 75.0000 mg | ORAL_TABLET | Freq: Every day | ORAL | 6 refills | Status: DC

## 2018-02-09 NOTE — Patient Instructions (Addendum)
Medication Instructions:   Stop Entresto.  Begin Avapro 75mg  daily.  Continue all other medications.    Labwork: none  Testing/Procedures: none  Follow-Up: Your physician wants you to follow up in:  1 year.  You will receive a reminder letter in the mail one-two months in advance.  If you don't receive a letter, please call our office to schedule the follow up appointment   Any Other Special Instructions Will Be Listed Below (If Applicable). Remote monitoring is used to monitor your Pacemaker of ICD from home. This monitoring reduces the number of office visits required to check your device to one time per year. It allows [place] to keep an eye on the functioning of your device to ensure it is working properly. You are scheduled for a device check from home on 03/12/2018. You may send your transmission at any time that day. If you have a wireless device, the transmission will be sent automatically. After your physician reviews your transmission, you will receive a postcard with your next transmission date.  If you need a refill on your cardiac medications before your next appointment, please call your pharmacy.

## 2018-02-09 NOTE — Progress Notes (Signed)
PCP: Kirstie Peri, MD Primary Cardiologist:  Dr Wyline Mood Primary EP: Dr Magdalene River Lauderbaugh is a 52 y.o. male who presents today for routine electrophysiology followup.  Since last being seen in our clinic, the patient reports doing very well.  + low BP with dizziness. Today, he denies symptoms of palpitations, chest pain, shortness of breath,  lower extremity edema, , presyncope, syncope, or ICD shocks.  The patient is otherwise without complaint today.   Past Medical History:  Diagnosis Date  . A-fib (HCC)   . Chronic systolic heart failure (HCC)   . CVA (cerebral infarction)   . Disability examination    Discussion of disability September, 2013  . Drug therapy    Sotalol started for VT storm  July, 2013  . Ejection fraction < 50%    Echo 06/05/12: Mild LVH, EF 20%, posterior severe HK, anterolateral severe HK, anterior severe HK, mid to apical septal AK, AK of the true apex, apical inferior HK, restrictive physiology, trivial MR, moderate LAE, mildly reduced RV function, mild TR.  Marland Kitchen Headache(784.0)    Patient seen to have headaches from spironolactone.  Drug was stopped, August, 2012  . Hypokalemia    There was some decrease in potassium with his admission in July, 2013. We will watch this very carefully.  . ICD (implantable cardiac defibrillator) battery depletion 11/11/10   St. Jude, Dr.Avelino Herren  . 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 Mitral regurgitation   . NICM (nonischemic cardiomyopathy) (HCC)    Nonischemic / catheterization October, 2011, normal coronary arteries  . Renal insufficiency    Creatinine 1.5 (GFR greater than 50)  . Sleepiness    has some sleepiness during the day  . VT (ventricular tachycardia) (HCC)    VT storm 7/13 - Sotalol started   Past Surgical History:  Procedure Laterality Date  . CARDIAC DEFIBRILLATOR PLACEMENT  11/11/10   SJM Fortify DR ICD implanted by Dr Johney Frame  . COLONOSCOPY N/A 07/13/2016   Procedure: COLONOSCOPY;  Surgeon:  Corbin Ade, MD;  Location: [facility];  Service: Endoscopy;  Laterality: N/A;  12:00 pm - pt can't move up due to transportation  . TEE WITH CARDIOVERSION  11/17/08   No LV clot    ROS- all systems are reviewed and negative except as per HPI above  Current Outpatient Medications  Medication Sig Dispense Refill  . allopurinol (ZYLOPRIM) 300 MG tablet Take 1 tablet by mouth daily.    Marland Kitchen atorvastatin (LIPITOR) 80 MG tablet Take 80 mg by mouth at bedtime.  0  . carvedilol (COREG) 6.25 MG tablet TAKE ONE TABLET BY MOUTH TWICE DAILY 60 tablet 0  . ELIQUIS 5 MG TABS tablet Take 5 mg by mouth 2 (two) times daily.  0  . furosemide (LASIX) 40 MG tablet Take 60 mg in the A.M. & 40 MG in the P.M. 225 tablet 3  . levETIRAcetam (KEPPRA) 500 MG tablet Take 1 tablet (500 mg total) by mouth 2 (two) times daily. 180 tablet 3  . sacubitril-valsartan (ENTRESTO) 24-26 MG Take 1 tablet by mouth 2 (two) times daily. 60 tablet 3  . sotalol (BETAPACE) 120 MG tablet TAKE ONE TABLET BY MOUTH EVERY TWELVE HOURS. 60 tablet 6   No current facility-administered medications for this visit.     Physical Exam: Vitals:   02/09/18 1157  BP: (!) 82/60  Pulse: (!) 56  SpO2: 98%  Weight: 220 lb (99.8 kg)  Height: 5\' 10"  (1.778 m)  GEN- The patient is well appearing, alert and oriented x 3 today.   Head- normocephalic, atraumatic Eyes-  Sclera clear, conjunctiva pink Ears- hearing intact Oropharynx- clear Lungs- Clear to ausculation bilaterally, normal work of breathing Chest- ICD pocket is well healed Heart- Regular rate and rhythm, no murmurs, rubs or gallops, PMI not laterally displaced GI- soft, NT, ND, + BS Extremities- no clubbing, cyanosis, or edema  ICD interrogation- reviewed in detail today,  See PACEART report  ekg tracing ordered today is personally reviewed and shows sinus rhythm 55 bpm, PR 168 msec, QRS 98 msec, QTc 447 msec  Assessment and Plan:  1.  Chronic systolic  dysfunction euvolemic today but hypotensive and dizzy Stop entresto and start irbesartan 75mg  daily.  Follow-up with Dr Diona Browner for further evaluation Normal ICD function See Arita Miss Art report No changes today  2. VT/VF mostly controlled with sotalol He did have VF event with successful 26J shock 11/18.  No events since that time QTc is stable Labs from 11/10/17 (PCP) reviewed today Normal ICD function Battery integrity alert has been loaded onto hsi device  3. Stroke On eliquis Making slow recovery He thinks he is about 80% recovered No afib on ICD interrogations    merlin Return to see me in 12 months in [place] Follow-up with Dr Wyline Mood as scheduled

## 2018-02-22 LAB — CUP PACEART INCLINIC DEVICE CHECK
Implantable Lead Implant Date: 20120105
Implantable Lead Location: 753859
Implantable Pulse Generator Implant Date: 20120105
MDC IDC LEAD IMPLANT DT: 20120105
MDC IDC LEAD LOCATION: 753860
MDC IDC SESS DTM: 20190418134547
Pulse Gen Serial Number: 622431

## 2018-03-02 ENCOUNTER — Encounter: Payer: Self-pay | Admitting: Neurology

## 2018-03-12 ENCOUNTER — Ambulatory Visit (INDEPENDENT_AMBULATORY_CARE_PROVIDER_SITE_OTHER): Payer: Medicare HMO | Admitting: *Deleted

## 2018-03-12 DIAGNOSIS — I472 Ventricular tachycardia, unspecified: Secondary | ICD-10-CM

## 2018-03-12 DIAGNOSIS — Z95810 Presence of automatic (implantable) cardiac defibrillator: Secondary | ICD-10-CM

## 2018-03-12 DIAGNOSIS — I5022 Chronic systolic (congestive) heart failure: Secondary | ICD-10-CM

## 2018-03-13 NOTE — Progress Notes (Signed)
Remote ICD transmission.   

## 2018-03-13 NOTE — Progress Notes (Signed)
EPIC Encounter for ICM Monitoring  Patient Name: Adam [name] is a 52 y.o. male Date: 03/13/2018 Primary Care Physican: Monico Blitz, MD Primary Cardiologist: Branch Electrophysiologist: Allred Dry Weight: wife unsure of weight  Spoke [place].  Heart Failure questions reviewed, pt asymptomatic. He currently has gout in one foot and being treated with medication.    Thoracic impedance normal but was abnormal suggesting fluid accumulation from 02/16/2018 - 02/22/2018.  Prescribed dosage: Furosemide40 mg take1.5 tablets (60 mgtotal)every AM and 40 mg every PM.   Labs: 11/10/2017 Creatinine 1.31, BUN 10, Potassium 4.3, Sodium 141, EGFR 61-72 10/14/2018Creatinine 1.02, BUN9, Potassium3.1, Sodium141 03/08/2017 Creatinine1.16, BUN9, Potassium3.5, Sodium142, EGFR>60  02/21/2017 Creatinine1.16, BUN9, Potassium3.6, Sodium140, EGFR>60  Recommendations: No changes.  Encouraged to call for fluid symptoms.  Follow-up plan: ICM clinic phone appointment on 04/17/2018.  Office appointment scheduled 04/18/2018 with Dr. Harl Bowie.  Copy of ICM check sent to Dr. Rayann Heman.   3 month ICM trend: 03/13/2018    1 Year ICM trend:       Rosalene Billings, RN 03/13/2018 10:59 AM

## 2018-03-14 ENCOUNTER — Encounter: Payer: Self-pay | Admitting: Cardiology

## 2018-03-26 LAB — CUP PACEART REMOTE DEVICE CHECK
Battery Remaining Longevity: 38 mo
Brady Statistic AP VP Percent: 1 %
Brady Statistic AP VS Percent: 2.2 %
Brady Statistic AS VP Percent: 1 %
Brady Statistic AS VS Percent: 94 %
HighPow Impedance: 52 Ohm
HighPow Impedance: 52 Ohm
Implantable Lead Implant Date: 20120105
Implantable Lead Location: 753859
Implantable Lead Model: 7122
Lead Channel Impedance Value: 330 Ohm
Lead Channel Pacing Threshold Amplitude: 1 V
Lead Channel Pacing Threshold Pulse Width: 0.4 ms
Lead Channel Sensing Intrinsic Amplitude: 3.8 mV
Lead Channel Setting Pacing Amplitude: 2.5 V
MDC IDC LEAD IMPLANT DT: 20120105
MDC IDC LEAD LOCATION: 753860
MDC IDC MSMT BATTERY REMAINING PERCENTAGE: 32 %
MDC IDC MSMT BATTERY VOLTAGE: 2.86 V
MDC IDC MSMT LEADCHNL RA SENSING INTR AMPL: 2.1 mV
MDC IDC MSMT LEADCHNL RV IMPEDANCE VALUE: 380 Ohm
MDC IDC MSMT LEADCHNL RV PACING THRESHOLD AMPLITUDE: 0.75 V
MDC IDC MSMT LEADCHNL RV PACING THRESHOLD PULSEWIDTH: 0.4 ms
MDC IDC PG IMPLANT DT: 20120105
MDC IDC PG SERIAL: 622431
MDC IDC SESS DTM: 20190507123942
MDC IDC SET LEADCHNL RA PACING AMPLITUDE: 2 V
MDC IDC SET LEADCHNL RV PACING PULSEWIDTH: 0.4 ms
MDC IDC SET LEADCHNL RV SENSING SENSITIVITY: 0.5 mV
MDC IDC STAT BRADY RA PERCENT PACED: 1 %
MDC IDC STAT BRADY RV PERCENT PACED: 1 %

## 2018-04-17 ENCOUNTER — Ambulatory Visit (INDEPENDENT_AMBULATORY_CARE_PROVIDER_SITE_OTHER): Payer: Medicare HMO

## 2018-04-17 ENCOUNTER — Telehealth: Payer: Self-pay | Admitting: Cardiology

## 2018-04-17 DIAGNOSIS — I5022 Chronic systolic (congestive) heart failure: Secondary | ICD-10-CM

## 2018-04-17 DIAGNOSIS — Z95810 Presence of automatic (implantable) cardiac defibrillator: Secondary | ICD-10-CM

## 2018-04-17 NOTE — Progress Notes (Signed)
EPIC Encounter for ICM Monitoring  Patient Name: Adam [name] is a 52 y.o. male Date: 04/17/2018 Primary Care Physican: Monico Blitz, MD Primary Cardiologist: Branch Electrophysiologist: Allred Dry Weight: wife unsure of weight  [place]. Heart Failure questions reviewed, pt asymptomatic. No symptoms during decreased impedance   Thoracic impedance normal but was abnormal suggesting fluid accumulation from 04/06/2018 - 04/15/2018.  Prescribed dosage: Furosemide40 mg take1.5 tablets (60 mgtotal)every AM and 40 mg every PM.   Labs: 11/10/2017 Creatinine 1.31, BUN 10, Potassium 4.3, Sodium 141, EGFR 61-72 10/14/2018Creatinine 1.02, BUN9, Potassium3.1, Sodium141 03/08/2017 Creatinine1.16, BUN9, Potassium3.5, Sodium142, EGFR>60  02/21/2017 Creatinine1.16, BUN9, Potassium3.6, Sodium140, EGFR>60  Recommendations: No changes.  Restrict sodium restriction to less than 2000 mg daily.  Encouraged to call for fluid symptoms.  Follow-up plan: ICM clinic phone appointment on 05/18/2018.  Office appointment scheduled 04/18/2018 with Dr. Harl Bowie.  Copy of ICM check sent to Dr. Harl Bowie and Dr. Rayann Heman.   3 month ICM trend: 04/17/2018    1 Year ICM trend:       Rosalene Billings, RN 04/17/2018 4:57 PM

## 2018-04-17 NOTE — Telephone Encounter (Signed)
Spoke with pt and reminded pt of remote transmission that is due today. Pt verbalized understanding.   

## 2018-04-18 ENCOUNTER — Ambulatory Visit (INDEPENDENT_AMBULATORY_CARE_PROVIDER_SITE_OTHER): Payer: Medicare HMO | Admitting: Cardiology

## 2018-04-18 ENCOUNTER — Encounter: Payer: Self-pay | Admitting: Cardiology

## 2018-04-18 ENCOUNTER — Other Ambulatory Visit: Payer: Self-pay

## 2018-04-18 VITALS — BP 94/60 | HR 70 | Ht 71.0 in | Wt 222.0 lb

## 2018-04-18 DIAGNOSIS — E782 Mixed hyperlipidemia: Secondary | ICD-10-CM

## 2018-04-18 DIAGNOSIS — I472 Ventricular tachycardia, unspecified: Secondary | ICD-10-CM

## 2018-04-18 DIAGNOSIS — I5022 Chronic systolic (congestive) heart failure: Secondary | ICD-10-CM

## 2018-04-18 DIAGNOSIS — G4733 Obstructive sleep apnea (adult) (pediatric): Secondary | ICD-10-CM | POA: Diagnosis not present

## 2018-04-18 NOTE — Progress Notes (Signed)
Clinical Summary Mr. Toole is a 52 y.o.male seen today for follow up of the following medical problems.   1. NICM/Chronic systolic HF 01/2013 echo LVEF 30-35%, cannot eval diastolic dysfunction. - cath 08/2010 patent coronaries - repeat echo 09/2017 LVEF 25-30% - ICD followed by EP. History of VT, ICD indications include secondary prevention. - can have some gout flares with diuretics.    - hypotension on entresto, stopped by Dr Johney Frame and started on ibresartan 75mg  daily. Symtpoms resolved - no recent edema. Occasioanl SOB at times. Not checking weights at home. Limiting sodium intake.  - compliant with meds    2. OSA - has not used CPAP for last few years   3. History of VT - followed by EP. He is on sotalol, also has ICD  - denies any palpitaitons - sent transmission yesterday  4. History of CVA -history ofleft MCA stroke early 01/09/16, received tPA.Marland Kitchen Readmit later in the month with possible seizure activity.  - followed by neurology    5. Paroxysmal aflutter - on coreg, eliquis. Happens to be on sotalol for prior VT as well  - tolerating eliquis without troubles.    6. Hyperlipidemia - Jan 2019 TC 92 TG 108 HDL 24 LDL 46  -compliant with meds         Past Medical History:  Diagnosis Date  . A-fib (HCC)   . Chronic systolic heart failure (HCC)   . CVA (cerebral infarction)   . Disability examination    Discussion of disability September, 2013  . Drug therapy    Sotalol started for VT storm  July, 2013  . Ejection fraction < 50%    Echo 06/05/12: Mild LVH, EF 20%, posterior severe HK, anterolateral severe HK, anterior severe HK, mid to apical septal AK, AK of the true apex, apical inferior HK, restrictive physiology, trivial MR, moderate LAE, mildly reduced RV function, mild TR.  Marland Kitchen Headache(784.0)    Patient seen to have headaches from spironolactone.  Drug was stopped, August, 2012  . Hypokalemia    There was some decrease in  potassium with his admission in July, 2013. We will watch this very carefully.  . ICD (implantable cardiac defibrillator) battery depletion 11/11/10   St. Jude, Dr.Allred  . 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 Mitral regurgitation   . NICM (nonischemic cardiomyopathy) (HCC)    Nonischemic / catheterization October, 2011, normal coronary arteries  . Renal insufficiency    Creatinine 1.5 (GFR greater than 50)  . Sleepiness    has some sleepiness during the day  . VT (ventricular tachycardia) (HCC)    VT storm 7/13 - Sotalol started     Allergies  Allergen Reactions  . Atorvastatin Other (See Comments)    headaches headaches     Current Outpatient Medications  Medication Sig Dispense Refill  . allopurinol (ZYLOPRIM) 300 MG tablet Take 1 tablet by mouth daily.    Marland Kitchen atorvastatin (LIPITOR) 80 MG tablet Take 80 mg by mouth at bedtime.  0  . carvedilol (COREG) 6.25 MG tablet TAKE ONE TABLET BY MOUTH TWICE DAILY 60 tablet 0  . ELIQUIS 5 MG TABS tablet Take 5 mg by mouth 2 (two) times daily.  0  . furosemide (LASIX) 40 MG tablet Take 60 mg in the A.M. & 40 MG in the P.M. 225 tablet 3  . irbesartan (AVAPRO) 75 MG tablet Take 1 tablet (75 mg total) by mouth daily. 30 tablet 6  . levETIRAcetam (KEPPRA)  500 MG tablet Take 1 tablet (500 mg total) by mouth 2 (two) times daily. 180 tablet 3  . sotalol (BETAPACE) 120 MG tablet TAKE ONE TABLET BY MOUTH EVERY TWELVE HOURS. 60 tablet 6   No current facility-administered medications for this visit.      Past Surgical History:  Procedure Laterality Date  . CARDIAC DEFIBRILLATOR PLACEMENT  11/11/10   SJM Fortify DR ICD implanted by Dr Johney Frame  . COLONOSCOPY N/A 07/13/2016   Procedure: COLONOSCOPY;  Surgeon: Corbin Ade, MD;  Location: [facility];  Service: Endoscopy;  Laterality: N/A;  12:00 pm - pt can't move up due to transportation  . TEE WITH CARDIOVERSION  11/17/08   No LV clot     Allergies  Allergen Reactions  .  Atorvastatin Other (See Comments)    headaches headaches      Family History  Problem Relation Age of Onset  . Heart attack Father 4       Multiple MIs  . Stroke Father   . Heart attack Brother   . Heart failure Brother        CHF  . Hypertension Mother   . Stroke Mother   . Heart attack Paternal Grandfather   . Colon cancer Neg Hx      Social History Mr. Mulvehill reports that he has never smoked. He has never used smokeless tobacco. Mr. Schulke reports that he drinks alcohol.   Review of Systems CONSTITUTIONAL: No weight loss, fever, chills, weakness or fatigue.  HEENT: Eyes: No visual loss, blurred vision, double vision or yellow sclerae.No hearing loss, sneezing, congestion, runny nose or sore throat.  SKIN: No rash or itching.  CARDIOVASCULAR: per hpi RESPIRATORY: No shortness of breath, cough or sputum.  GASTROINTESTINAL: No anorexia, nausea, vomiting or diarrhea. No abdominal pain or blood.  GENITOURINARY: No burning on urination, no polyuria NEUROLOGICAL: No headache, dizziness, syncope, paralysis, ataxia, numbness or tingling in the extremities. No change in bowel or bladder control.  MUSCULOSKELETAL: No muscle, back pain, joint pain or stiffness.  LYMPHATICS: No enlarged nodes. No history of splenectomy.  PSYCHIATRIC: No history of depression or anxiety.  ENDOCRINOLOGIC: No reports of sweating, cold or heat intolerance. No polyuria or polydipsia.  Marland Kitchen   Physical Examination Vitals:   04/18/18 1030  BP: 94/60  Pulse: 70  SpO2: 99%   Vitals:   04/18/18 1030  Weight: 222 lb (100.7 kg)  Height: 5\' 11"  (1.803 m)    Gen: resting comfortably, no acute distress HEENT: no scleral icterus, pupils equal round and reactive, no palptable cervical adenopathy,  CV: RRR, no m/r/g, no jvd Resp: Clear to auscultation bilaterally GI: abdomen is soft, non-tender, non-distended, normal bowel sounds, no hepatosplenomegaly MSK: extremities are warm, no edema.  Skin: warm,  no rash Neuro:  no focal deficits Psych: appropriate affect   Diagnostic Studies  01/2013 echo Study Conclusions  - Left ventricle: The cavity size was normal. There was moderate concentric hypertrophy with thinning and akinesis of the posterior wall. LV Apical false tendon. There is posterior akinesis and inferoapical severe hypokinesis. The mid to distal anterior wall and septum is also severely hypokinetic. Systolic function was moderately to severely reduced. The estimated ejection fraction was in the range of 30% to 35%. The study is not technically sufficient to allow evaluation of LV diastolic function. - Aortic valve: Transvalvular velocity was within the normal range. There was no stenosis. Trace to mild central regurgitation. - Mitral valve: Mildly thickened leaflets with minimal, bi-leafletlate  systolic prolapse. Mild regurgitation. - Left atrium: LA Volume/ BSA = 30.98ml/m2 The atrium was mildly dilated. - Right ventricle: The cavity size was normal. Wall thickness was normal. AICD wirenoted in right ventricle. Systolic function was normal. - Right atrium: The atrium was normal in size. AICD wire noted in right atrium. - Tricuspid valve: Mild regurgitation. - Inferior vena cava: The vessel was normal in size; the respirophasic diameter changes were in the normal range (= 50%); findings are consistent with normal central venous pressure.   01/2015 Echo [facility] Interpretation Summary  A complete portable two-dimensional transthoracic echocardiogram with color  flow Doppler and Spectral Doppler was performed. Saline contrast injection  was performed.  The left ventricle is mildly dilated.  There is normal left ventricular wall thickness.  There is akinesis of the inferior, posterior, and lateral walls.  There is moderate diffuse hypokinesis of the remaining left ventricular  segments.  The left ventricular  ejection fraction is markedly reduced (25-30%).  The left ventricular diastolic function is normal.  The aortic valve is not well visualized, but is grossly normal.  Injection of contrast documented no interatrial shunt .    01/2015 [facility] IMPRESSION:   1. Vasculature is unchanged and without stenosis or an aneurysm or cutoff sign. No hemorrhage.  2. Possible developing hypodensity in the left insular region.  3. Old infarct in the right occipital lobe.  4. Right upper lobe pneumonia.   09/2017 echo Study Conclusions  - Left ventricle: The cavity size was mildly dilated. Wall thickness was normal. Systolic function was severely reduced. The estimated ejection fraction was in the range of 25% to 30%. Diastolic function is abnormal, indeterminant grade. - Regional wall motion abnormality: Akinesis of the apical septal and apical myocardium; hypokinesis of the mid inferoseptal, mid inferior, mid inferolateral, basal-mid anterolateral, and apical lateral myocardium. - Aortic valve: Mildly calcified annulus. Trileaflet; mildly thickened leaflets. There was mild regurgitation. Valve area (VTI): 2.32 cm^2. Valve area (Vmax): 2.36 cm^2. Valve area (Vmean): 2.32 cm^2. - Mitral valve: Mildly calcified annulus. Mildly thickened leaflets . There was mild regurgitation. - Left atrium: The atrium was severely dilated. - Right atrium: The atrium was moderately dilated. - Technically adequate study.      Assessment and Plan  1. NICM/Chronic systolic HF - no recent symptoms. NYHA III.  Did not tolerate entresto due to hypotension - continue current meds  2. History of VT - ICD followed by EP, continue regular device checks. On sotalol followed by EP - continue in device clinic  3. Hyperlipidemia At goal, continue statin   4. Paroxysmal aflutter - CHADS2Vasc score 3, continue eliquis. - no recent symptoms, cotnineu current  meds  5. OSA - refer to Dr Juanetta Gosling to see if any adjustments with cpap can help with compliance.    F/u 4 months  Antoine Poche, M.D.

## 2018-04-18 NOTE — Patient Instructions (Signed)
Medication Instructions:  Continue all current medications.  Labwork: none  Testing/Procedures: none  Follow-Up: 4 months   Any Other Special Instructions Will Be Listed Below (If Applicable). You have been referred to:  Dr. Juanetta Gosling - evaluation for sleep apnea.   If you need a refill on your cardiac medications before your next appointment, please call your pharmacy.

## 2018-04-21 DIAGNOSIS — M109 Gout, unspecified: Secondary | ICD-10-CM | POA: Diagnosis not present

## 2018-04-21 DIAGNOSIS — M19072 Primary osteoarthritis, left ankle and foot: Secondary | ICD-10-CM | POA: Diagnosis not present

## 2018-04-21 DIAGNOSIS — Z95810 Presence of automatic (implantable) cardiac defibrillator: Secondary | ICD-10-CM | POA: Diagnosis not present

## 2018-04-21 DIAGNOSIS — I10 Essential (primary) hypertension: Secondary | ICD-10-CM | POA: Diagnosis not present

## 2018-04-21 DIAGNOSIS — Z79899 Other long term (current) drug therapy: Secondary | ICD-10-CM | POA: Diagnosis not present

## 2018-04-21 DIAGNOSIS — Z7902 Long term (current) use of antithrombotics/antiplatelets: Secondary | ICD-10-CM | POA: Diagnosis not present

## 2018-04-24 ENCOUNTER — Encounter: Payer: Self-pay | Admitting: Cardiology

## 2018-04-26 DIAGNOSIS — R51 Headache: Secondary | ICD-10-CM | POA: Diagnosis not present

## 2018-04-26 DIAGNOSIS — S0990XA Unspecified injury of head, initial encounter: Secondary | ICD-10-CM | POA: Diagnosis not present

## 2018-04-26 DIAGNOSIS — Z8673 Personal history of transient ischemic attack (TIA), and cerebral infarction without residual deficits: Secondary | ICD-10-CM | POA: Diagnosis not present

## 2018-04-26 DIAGNOSIS — I10 Essential (primary) hypertension: Secondary | ICD-10-CM | POA: Diagnosis not present

## 2018-04-26 DIAGNOSIS — S199XXA Unspecified injury of neck, initial encounter: Secondary | ICD-10-CM | POA: Diagnosis not present

## 2018-04-26 DIAGNOSIS — S161XXA Strain of muscle, fascia and tendon at neck level, initial encounter: Secondary | ICD-10-CM | POA: Diagnosis not present

## 2018-04-26 DIAGNOSIS — I4891 Unspecified atrial fibrillation: Secondary | ICD-10-CM | POA: Diagnosis not present

## 2018-04-26 DIAGNOSIS — Z7902 Long term (current) use of antithrombotics/antiplatelets: Secondary | ICD-10-CM | POA: Diagnosis not present

## 2018-04-26 DIAGNOSIS — Z95810 Presence of automatic (implantable) cardiac defibrillator: Secondary | ICD-10-CM | POA: Diagnosis not present

## 2018-04-26 DIAGNOSIS — E7800 Pure hypercholesterolemia, unspecified: Secondary | ICD-10-CM | POA: Diagnosis not present

## 2018-04-26 DIAGNOSIS — Z79899 Other long term (current) drug therapy: Secondary | ICD-10-CM | POA: Diagnosis not present

## 2018-05-01 DIAGNOSIS — I639 Cerebral infarction, unspecified: Secondary | ICD-10-CM | POA: Diagnosis not present

## 2018-05-01 DIAGNOSIS — I10 Essential (primary) hypertension: Secondary | ICD-10-CM | POA: Diagnosis not present

## 2018-05-14 DIAGNOSIS — N529 Male erectile dysfunction, unspecified: Secondary | ICD-10-CM | POA: Diagnosis not present

## 2018-05-14 DIAGNOSIS — I4891 Unspecified atrial fibrillation: Secondary | ICD-10-CM | POA: Diagnosis not present

## 2018-05-14 DIAGNOSIS — Z299 Encounter for prophylactic measures, unspecified: Secondary | ICD-10-CM | POA: Diagnosis not present

## 2018-05-14 DIAGNOSIS — I509 Heart failure, unspecified: Secondary | ICD-10-CM | POA: Diagnosis not present

## 2018-05-14 DIAGNOSIS — I10 Essential (primary) hypertension: Secondary | ICD-10-CM | POA: Diagnosis not present

## 2018-05-14 DIAGNOSIS — Z6830 Body mass index (BMI) 30.0-30.9, adult: Secondary | ICD-10-CM | POA: Diagnosis not present

## 2018-05-14 DIAGNOSIS — M109 Gout, unspecified: Secondary | ICD-10-CM | POA: Diagnosis not present

## 2018-05-18 ENCOUNTER — Telehealth: Payer: Self-pay | Admitting: Cardiology

## 2018-05-18 NOTE — Telephone Encounter (Signed)
Spoke with pt and reminded pt of remote transmission that is due today. Pt verbalized understanding.   

## 2018-05-21 NOTE — Progress Notes (Signed)
No ICM remote transmission received for 05/18/2018 and next ICM transmission scheduled for 06/11/2018.

## 2018-05-30 DIAGNOSIS — I10 Essential (primary) hypertension: Secondary | ICD-10-CM | POA: Diagnosis not present

## 2018-05-30 DIAGNOSIS — I639 Cerebral infarction, unspecified: Secondary | ICD-10-CM | POA: Diagnosis not present

## 2018-06-05 ENCOUNTER — Other Ambulatory Visit: Payer: Self-pay | Admitting: Neurology

## 2018-06-11 ENCOUNTER — Ambulatory Visit (INDEPENDENT_AMBULATORY_CARE_PROVIDER_SITE_OTHER): Payer: Medicare HMO

## 2018-06-11 ENCOUNTER — Ambulatory Visit (INDEPENDENT_AMBULATORY_CARE_PROVIDER_SITE_OTHER): Payer: Medicare HMO | Admitting: *Deleted

## 2018-06-11 DIAGNOSIS — Z95810 Presence of automatic (implantable) cardiac defibrillator: Secondary | ICD-10-CM | POA: Diagnosis not present

## 2018-06-11 DIAGNOSIS — I5022 Chronic systolic (congestive) heart failure: Secondary | ICD-10-CM

## 2018-06-11 DIAGNOSIS — I428 Other cardiomyopathies: Secondary | ICD-10-CM

## 2018-06-11 NOTE — Progress Notes (Signed)
EPIC Encounter for ICM Monitoring  Patient Name: Adam [name] is a 52 y.o. male Date: 06/11/2018 Primary Care Physican: Monico Blitz, MD Primary Cardiologist: Branch Electrophysiologist: Allred Dry Weight:215 lbs  Heart Failure questions reviewed, pt asymptomatic.  He has been drinking more fluids due to hot weather.  Advised if inside and it is cooler to limit fluid to 64 oz a day.     Thoracic impedance normal starting downward trend suggesting the start of fluid accumulation starting 06/10/2018.  Prescribed dosage: Furosemide40 mg take1.5 tablets (60 mgtotal)every AM and 40 mg every PM.   Labs: 11/10/2017 Creatinine 1.31, BUN 10, Potassium 4.3, Sodium 141, EGFR 61-72 10/14/2018Creatinine 1.02, BUN9, Potassium3.1, Sodium141 03/08/2017 Creatinine1.16, BUN9, Potassium3.5, Sodium142, EGFR>60  02/21/2017 Creatinine1.16, BUN9, Potassium3.6, Sodium140, EGFR>60  Recommendations: No changes.   Encouraged to call for fluid symptoms.  Follow-up plan: ICM clinic phone appointment on 06/19/2018 to recheck fluid levels.    Copy of ICM check sent to Dr. Rayann Heman and Dr Harl Bowie for review and if any recommendations will call back.   3 month ICM trend: 06/11/2018    1 Year ICM trend:       Rosalene Billings, RN 06/11/2018 3:15 PM

## 2018-06-13 NOTE — Progress Notes (Signed)
Remote ICD transmission.   

## 2018-06-19 ENCOUNTER — Ambulatory Visit (INDEPENDENT_AMBULATORY_CARE_PROVIDER_SITE_OTHER): Payer: Medicare HMO

## 2018-06-19 DIAGNOSIS — I5022 Chronic systolic (congestive) heart failure: Secondary | ICD-10-CM

## 2018-06-19 DIAGNOSIS — Z95810 Presence of automatic (implantable) cardiac defibrillator: Secondary | ICD-10-CM

## 2018-06-19 NOTE — Progress Notes (Signed)
EPIC Encounter for ICM Monitoring  Patient Name: Adam [name] is a 52 y.o. male Date: 06/19/2018 Primary Care Physican: Monico Blitz, MD Primary Cardiologist: Branch Electrophysiologist: Allred Dry Weight:213 lbs      Heart Failure questions reviewed, pt asymptomatic. Weight decreased 2 lbs.    Thoracic impedance returned to normal in response to decreasing fluid intake.  Prescribed dosage: Furosemide40 mg take1.5 tablets (60 mgtotal)every AM and 40 mg every PM.   Labs: 11/10/2017 Creatinine 1.31, BUN 10, Potassium 4.3, Sodium 141, EGFR 61-72 10/14/2018Creatinine 1.02, BUN9, Potassium3.1, Sodium141 03/08/2017 Creatinine1.16, BUN9, Potassium3.5, Sodium142, EGFR>60  02/21/2017 Creatinine1.16, BUN9, Potassium3.6, Sodium140, EGFR>60  Recommendations: No changes.  Advised to limit fluid intake to 64 oz daily.  Encouraged to call for fluid symptoms.  Follow-up plan: ICM clinic phone appointment on 07/16/2018.      Copy of ICM check sent to Dr. Rayann Heman.   3 month ICM trend: 06/19/2018    1 Year ICM trend:       Rosalene Billings, RN 06/19/2018 9:48 AM

## 2018-06-27 DIAGNOSIS — I639 Cerebral infarction, unspecified: Secondary | ICD-10-CM | POA: Diagnosis not present

## 2018-06-27 DIAGNOSIS — I10 Essential (primary) hypertension: Secondary | ICD-10-CM | POA: Diagnosis not present

## 2018-07-02 ENCOUNTER — Other Ambulatory Visit: Payer: Self-pay | Admitting: Internal Medicine

## 2018-07-10 LAB — CUP PACEART REMOTE DEVICE CHECK
Battery Remaining Percentage: 30 %
Brady Statistic AP VS Percent: 1.5 %
Brady Statistic AS VP Percent: 1 %
Brady Statistic RA Percent Paced: 1 %
Brady Statistic RV Percent Paced: 1 %
HighPow Impedance: 56 Ohm
HighPow Impedance: 56 Ohm
Implantable Lead Implant Date: 20120105
Implantable Lead Location: 753859
Implantable Lead Model: 7122
Lead Channel Impedance Value: 330 Ohm
Lead Channel Impedance Value: 380 Ohm
Lead Channel Pacing Threshold Amplitude: 0.75 V
Lead Channel Pacing Threshold Pulse Width: 0.4 ms
Lead Channel Sensing Intrinsic Amplitude: 2.1 mV
Lead Channel Setting Pacing Amplitude: 2.5 V
Lead Channel Setting Pacing Pulse Width: 0.4 ms
Lead Channel Setting Sensing Sensitivity: 0.5 mV
MDC IDC LEAD IMPLANT DT: 20120105
MDC IDC LEAD LOCATION: 753860
MDC IDC MSMT BATTERY REMAINING LONGEVITY: 34 mo
MDC IDC MSMT BATTERY VOLTAGE: 2.84 V
MDC IDC MSMT LEADCHNL RA PACING THRESHOLD AMPLITUDE: 1 V
MDC IDC MSMT LEADCHNL RA PACING THRESHOLD PULSEWIDTH: 0.4 ms
MDC IDC MSMT LEADCHNL RV SENSING INTR AMPL: 4.8 mV
MDC IDC PG IMPLANT DT: 20120105
MDC IDC PG SERIAL: 622431
MDC IDC SESS DTM: 20190805091125
MDC IDC SET LEADCHNL RA PACING AMPLITUDE: 2 V
MDC IDC STAT BRADY AP VP PERCENT: 1 %
MDC IDC STAT BRADY AS VS PERCENT: 95 %

## 2018-07-12 DIAGNOSIS — I509 Heart failure, unspecified: Secondary | ICD-10-CM | POA: Diagnosis not present

## 2018-07-12 DIAGNOSIS — I495 Sick sinus syndrome: Secondary | ICD-10-CM | POA: Diagnosis not present

## 2018-07-12 DIAGNOSIS — Z95810 Presence of automatic (implantable) cardiac defibrillator: Secondary | ICD-10-CM | POA: Diagnosis not present

## 2018-07-12 DIAGNOSIS — G40822 Epileptic spasms, not intractable, without status epilepticus: Secondary | ICD-10-CM | POA: Diagnosis not present

## 2018-07-12 DIAGNOSIS — Z79899 Other long term (current) drug therapy: Secondary | ICD-10-CM | POA: Diagnosis not present

## 2018-07-12 DIAGNOSIS — I2782 Chronic pulmonary embolism: Secondary | ICD-10-CM | POA: Diagnosis not present

## 2018-07-12 DIAGNOSIS — I69351 Hemiplegia and hemiparesis following cerebral infarction affecting right dominant side: Secondary | ICD-10-CM | POA: Diagnosis not present

## 2018-07-12 DIAGNOSIS — F320 Major depressive disorder, single episode, mild: Secondary | ICD-10-CM | POA: Diagnosis not present

## 2018-07-12 DIAGNOSIS — N189 Chronic kidney disease, unspecified: Secondary | ICD-10-CM | POA: Diagnosis not present

## 2018-07-12 DIAGNOSIS — I130 Hypertensive heart and chronic kidney disease with heart failure and stage 1 through stage 4 chronic kidney disease, or unspecified chronic kidney disease: Secondary | ICD-10-CM | POA: Diagnosis not present

## 2018-07-12 DIAGNOSIS — I480 Paroxysmal atrial fibrillation: Secondary | ICD-10-CM | POA: Diagnosis not present

## 2018-07-16 ENCOUNTER — Ambulatory Visit (INDEPENDENT_AMBULATORY_CARE_PROVIDER_SITE_OTHER): Payer: Medicare HMO

## 2018-07-16 DIAGNOSIS — Z95810 Presence of automatic (implantable) cardiac defibrillator: Secondary | ICD-10-CM

## 2018-07-16 DIAGNOSIS — I5022 Chronic systolic (congestive) heart failure: Secondary | ICD-10-CM

## 2018-07-16 NOTE — Progress Notes (Signed)
EPIC Encounter for ICM Monitoring  Patient Name: Adam [name] is a 51 y.o. male Date: 07/16/2018 Primary Care Physican: Shah, Ashish, MD Primary Cardiologist: Branch Electrophysiologist: Allred Dry Weight:Previous weight 213 lbs      Attempted call to patient/wife and unable to reach.  Left detailed message, per DPR, regarding transmission and advised to limit salt and fluid intake.  Transmission reviewed.    Thoracic impedance abnormal suggesting fluid accumulation starting 07/14/2018.  Prescribed dosage: Furosemide40 mg take1.5 tablets (60 mgtotal)every AM and 40 mg every PM.   Labs: 11/10/2017 Creatinine 1.31, BUN 10, Potassium 4.3, Sodium 141, EGFR 61-72 10/14/2018Creatinine 1.02, BUN9, Potassium3.1, Sodium141 03/08/2017 Creatinine1.16, BUN9, Potassium3.5, Sodium142, EGFR>60  02/21/2017 Creatinine1.16, BUN9, Potassium3.6, Sodium140, EGFR>60  Recommendations: Left voice mail with ICM number and encouraged to call if experiencing any fluid symptoms.  Follow-up plan: ICM clinic phone appointment on 07/24/2018 to recheck fluid levels.     Copy of ICM check sent to Dr. Allred and Dr Branch for review and recommendations if needed.   3 month ICM trend: 07/16/2018    1 Year ICM trend:       Laurie S Short, RN 07/16/2018 1:54 PM   

## 2018-07-24 ENCOUNTER — Ambulatory Visit (INDEPENDENT_AMBULATORY_CARE_PROVIDER_SITE_OTHER): Payer: Self-pay

## 2018-07-24 ENCOUNTER — Telehealth: Payer: Self-pay

## 2018-07-24 DIAGNOSIS — Z95810 Presence of automatic (implantable) cardiac defibrillator: Secondary | ICD-10-CM

## 2018-07-24 DIAGNOSIS — I5022 Chronic systolic (congestive) heart failure: Secondary | ICD-10-CM

## 2018-07-24 NOTE — Progress Notes (Signed)
EPIC Encounter for ICM Monitoring  Patient Name: Adam [name] is a 52 y.o. male Date: 07/24/2018 Primary Care Physican: Monico Blitz, MD Primary Cardiologist: Branch Electrophysiologist: Allred Dry Weight:Previous weight 213lbs        Attempted call to wife and unable to reach.  Transmission reviewed.    Thoracic impedance returned to normal since last remote transmission 07/16/2018.  Prescribed: Furosemide40 mg take1.5 tablets (60 mgtotal)every AM and 40 mg every PM.   Labs: 11/10/2017 Creatinine 1.31, BUN 10, Potassium 4.3, Sodium 141, EGFR 61-72 10/14/2018Creatinine 1.02, BUN9, Potassium3.1, Sodium141 03/08/2017 Creatinine1.16, BUN9, Potassium3.5, [id number], EGFR>60  02/21/2017 Creatinine1.16, BUN9, Potassium3.6, Sodium140, EGFR>60  Recommendations: NONE - Unable to reach.  Follow-up plan: ICM clinic phone appointment on 08/16/2018.      Copy of ICM check sent to Dr. Rayann Heman.   3 month ICM trend: 07/24/2018    1 Year ICM trend:       Rosalene Billings, RN 07/24/2018 10:02 AM

## 2018-07-24 NOTE — Telephone Encounter (Signed)
Remote ICM transmission received.  Attempted call to wife and no answer

## 2018-07-30 DIAGNOSIS — Z299 Encounter for prophylactic measures, unspecified: Secondary | ICD-10-CM | POA: Diagnosis not present

## 2018-07-30 DIAGNOSIS — I10 Essential (primary) hypertension: Secondary | ICD-10-CM | POA: Diagnosis not present

## 2018-07-30 DIAGNOSIS — M25571 Pain in right ankle and joints of right foot: Secondary | ICD-10-CM | POA: Diagnosis not present

## 2018-07-30 DIAGNOSIS — Z6830 Body mass index (BMI) 30.0-30.9, adult: Secondary | ICD-10-CM | POA: Diagnosis not present

## 2018-07-30 DIAGNOSIS — Z86711 Personal history of pulmonary embolism: Secondary | ICD-10-CM | POA: Diagnosis not present

## 2018-07-30 DIAGNOSIS — I639 Cerebral infarction, unspecified: Secondary | ICD-10-CM | POA: Diagnosis not present

## 2018-07-30 DIAGNOSIS — I2699 Other pulmonary embolism without acute cor pulmonale: Secondary | ICD-10-CM | POA: Diagnosis not present

## 2018-08-01 ENCOUNTER — Other Ambulatory Visit: Payer: Self-pay | Admitting: Internal Medicine

## 2018-08-01 NOTE — Telephone Encounter (Signed)
This is a [place] pt °

## 2018-08-02 ENCOUNTER — Other Ambulatory Visit: Payer: Self-pay | Admitting: Cardiology

## 2018-08-06 DIAGNOSIS — N183 Chronic kidney disease, stage 3 (moderate): Secondary | ICD-10-CM | POA: Diagnosis not present

## 2018-08-06 DIAGNOSIS — E669 Obesity, unspecified: Secondary | ICD-10-CM | POA: Diagnosis not present

## 2018-08-06 DIAGNOSIS — Z6830 Body mass index (BMI) 30.0-30.9, adult: Secondary | ICD-10-CM | POA: Diagnosis not present

## 2018-08-06 DIAGNOSIS — I509 Heart failure, unspecified: Secondary | ICD-10-CM | POA: Diagnosis not present

## 2018-08-06 DIAGNOSIS — E1122 Type 2 diabetes mellitus with diabetic chronic kidney disease: Secondary | ICD-10-CM | POA: Diagnosis not present

## 2018-08-16 ENCOUNTER — Ambulatory Visit (INDEPENDENT_AMBULATORY_CARE_PROVIDER_SITE_OTHER): Payer: Medicare HMO

## 2018-08-16 DIAGNOSIS — I5022 Chronic systolic (congestive) heart failure: Secondary | ICD-10-CM | POA: Diagnosis not present

## 2018-08-16 DIAGNOSIS — Z95810 Presence of automatic (implantable) cardiac defibrillator: Secondary | ICD-10-CM

## 2018-08-17 NOTE — Progress Notes (Signed)
EPIC Encounter for ICM Monitoring  Patient Name: Adam [name] is a 52 y.o. male Date: 08/17/2018 Primary Care Physican: Monico Blitz, MD Primary Cardiologist: Branch Electrophysiologist: Allred Dry Weight:213lbs      Spoke to wife.   Heart Failure questions reviewed, pt asymptomatic.   Thoracic impedance normal.   Prescribed: Furosemide40 mg take1.5 tablets (60 mgtotal)every AM and 40 mg every PM.   Labs: 11/10/2017 Creatinine 1.31, BUN 10, Potassium 4.3, Sodium 141, EGFR 61-72  Recommendations: No changes.   Encouraged to call for fluid symptoms.  Follow-up plan: ICM clinic phone appointment on 09/17/2018.   Office appointment scheduled 09/10/2018 with Dr. Harl Bowie.    Copy of ICM check sent to Dr. Rayann Heman.   3 month ICM trend: 08/16/2018    1 Year ICM trend:       Rosalene Billings, RN 08/17/2018 12:37 PM

## 2018-08-28 ENCOUNTER — Ambulatory Visit: Payer: Medicare HMO | Admitting: Cardiology

## 2018-09-10 ENCOUNTER — Encounter: Payer: Self-pay | Admitting: Cardiology

## 2018-09-10 ENCOUNTER — Ambulatory Visit (INDEPENDENT_AMBULATORY_CARE_PROVIDER_SITE_OTHER): Payer: Medicare HMO | Admitting: Cardiology

## 2018-09-10 VITALS — BP 92/60 | HR 56 | Ht 71.0 in | Wt 216.0 lb

## 2018-09-10 DIAGNOSIS — I472 Ventricular tachycardia, unspecified: Secondary | ICD-10-CM

## 2018-09-10 DIAGNOSIS — I5022 Chronic systolic (congestive) heart failure: Secondary | ICD-10-CM | POA: Diagnosis not present

## 2018-09-10 DIAGNOSIS — G4733 Obstructive sleep apnea (adult) (pediatric): Secondary | ICD-10-CM | POA: Diagnosis not present

## 2018-09-10 DIAGNOSIS — N289 Disorder of kidney and ureter, unspecified: Secondary | ICD-10-CM

## 2018-09-10 DIAGNOSIS — I4892 Unspecified atrial flutter: Secondary | ICD-10-CM
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
## 2018-09-10 NOTE — Patient Instructions (Addendum)
Medication Instructions:  Continue all current medications.  Labwork:  BMET, Magnesium - orders given today.   Office will contact with results via phone or letter.    Testing/Procedures: none  Follow-Up: 4 months   Any Other Special Instructions Will Be Listed Below (If Applicable).  [facility] phone number:  [phone]  You have been referred to:  Dr. Juanetta Gosling for evaluation of sleep apnea.    If you need a refill on your cardiac medications before your next appointment, please call your pharmacy.

## 2018-09-14 DIAGNOSIS — I429 Cardiomyopathy, unspecified: Secondary | ICD-10-CM | POA: Diagnosis not present

## 2018-09-14 DIAGNOSIS — I4891 Unspecified atrial fibrillation: Secondary | ICD-10-CM | POA: Diagnosis not present

## 2018-09-14 DIAGNOSIS — Z299 Encounter for prophylactic measures, unspecified: Secondary | ICD-10-CM | POA: Diagnosis not present

## 2018-09-14 DIAGNOSIS — M109 Gout, unspecified: Secondary | ICD-10-CM | POA: Diagnosis not present

## 2018-09-14 DIAGNOSIS — I10 Essential (primary) hypertension: Secondary | ICD-10-CM | POA: Diagnosis not present

## 2018-09-14 DIAGNOSIS — I639 Cerebral infarction, unspecified: Secondary | ICD-10-CM | POA: Diagnosis not present

## 2018-09-14 DIAGNOSIS — Z2821 Immunization not carried out because of patient refusal: Secondary | ICD-10-CM | POA: Diagnosis not present

## 2018-09-14 DIAGNOSIS — Z6829 Body mass index (BMI) 29.0-29.9, adult: Secondary | ICD-10-CM | POA: Diagnosis not present

## 2018-09-17 ENCOUNTER — Ambulatory Visit (INDEPENDENT_AMBULATORY_CARE_PROVIDER_SITE_OTHER): Payer: Medicare HMO | Admitting: *Deleted

## 2018-09-17 ENCOUNTER — Ambulatory Visit (INDEPENDENT_AMBULATORY_CARE_PROVIDER_SITE_OTHER): Payer: Medicare HMO

## 2018-09-17 DIAGNOSIS — I5022 Chronic systolic (congestive) heart failure: Secondary | ICD-10-CM

## 2018-09-17 DIAGNOSIS — I428 Other cardiomyopathies: Secondary | ICD-10-CM

## 2018-09-17 DIAGNOSIS — Z95810 Presence of automatic (implantable) cardiac defibrillator: Secondary | ICD-10-CM | POA: Diagnosis not present

## 2018-09-17 NOTE — Progress Notes (Signed)
Remote ICD transmission.   

## 2018-09-18 NOTE — Progress Notes (Signed)
EPIC Encounter for ICM Monitoring  Patient Name: Adam [name] is a 52 y.o. male Date: 09/18/2018 Primary Care Physican: Monico Blitz, MD Primary Cardiologist: Branch Electrophysiologist: Allred Last Weight: 213lbs Today's Weight: 213 lbs        Spoke with wife. Heart Failure questions reviewed, pt asymptomatic.   Thoracic impedance normal.   Prescribed: Furosemide40 mg take1.5 tablets (60 mgtotal)every AM and 40 mg every PM.   Labs: 11/10/2017 Creatinine 1.31, BUN 10, Potassium 4.3, Sodium 141, EGFR 61-72  Recommendations: No changes.    Encouraged to call for fluid symptoms.  Follow-up plan: ICM clinic phone appointment on 10/18/2018.      Copy of ICM check sent to Dr. Rayann Heman.   3 month ICM trend: 09/17/2018    1 Year ICM trend:       Rosalene Billings, RN 09/18/2018 10:56 AM

## 2018-09-19 ENCOUNTER — Telehealth: Payer: Self-pay | Admitting: *Deleted

## 2018-09-19 NOTE — Telephone Encounter (Signed)
-----   Message from Antoine Poche, MD sent at 09/17/2018  4:02 PM EST ----- Labs look fine   Dominga Ferry MD

## 2018-09-19 NOTE — Telephone Encounter (Signed)
Patient informed. 

## 2018-09-20 DIAGNOSIS — F320 Major depressive disorder, single episode, mild: Secondary | ICD-10-CM | POA: Diagnosis not present

## 2018-09-22 DIAGNOSIS — F419 Anxiety disorder, unspecified: Secondary | ICD-10-CM | POA: Diagnosis not present

## 2018-09-22 DIAGNOSIS — R0789 Other chest pain: Secondary | ICD-10-CM | POA: Diagnosis not present

## 2018-09-22 DIAGNOSIS — I10 Essential (primary) hypertension: Secondary | ICD-10-CM | POA: Diagnosis not present

## 2018-09-22 DIAGNOSIS — R0602 Shortness of breath: Secondary | ICD-10-CM | POA: Diagnosis not present

## 2018-09-22 DIAGNOSIS — Z79899 Other long term (current) drug therapy: Secondary | ICD-10-CM | POA: Diagnosis not present

## 2018-09-22 DIAGNOSIS — R079 Chest pain, unspecified: Secondary | ICD-10-CM | POA: Diagnosis not present

## 2018-09-22 DIAGNOSIS — Z950 Presence of cardiac pacemaker: Secondary | ICD-10-CM | POA: Diagnosis not present

## 2018-09-22 DIAGNOSIS — Z7901 Long term (current) use of anticoagulants: Secondary | ICD-10-CM | POA: Diagnosis not present

## 2018-10-02 DIAGNOSIS — I639 Cerebral infarction, unspecified: Secondary | ICD-10-CM | POA: Diagnosis not present

## 2018-10-02 DIAGNOSIS — I10 Essential (primary) hypertension: Secondary | ICD-10-CM | POA: Diagnosis not present

## 2018-10-18 ENCOUNTER — Ambulatory Visit (INDEPENDENT_AMBULATORY_CARE_PROVIDER_SITE_OTHER): Payer: Medicare HMO

## 2018-10-18 DIAGNOSIS — Z95810 Presence of automatic (implantable) cardiac defibrillator: Secondary | ICD-10-CM | POA: Diagnosis not present

## 2018-10-18 DIAGNOSIS — I5022 Chronic systolic (congestive) heart failure: Secondary | ICD-10-CM | POA: Diagnosis not present

## 2018-10-19 ENCOUNTER — Telehealth: Payer: Self-pay

## 2018-10-19 NOTE — Telephone Encounter (Signed)
Remote ICM transmission received.  Attempted call to wife regarding ICM remote transmission and left detailed message, per DPR, with next ICM remote transmission date of 10/30/2019 to recheck fluid levels

## 2018-10-19 NOTE — Progress Notes (Signed)
EPIC Encounter for ICM Monitoring  Patient Name: Adam [name] is a 52 y.o. male Date: 10/19/2018 Primary Care Physican: Monico Blitz, MD Primary Cardiologist: Harl Bowie Electrophysiologist: Allred Last Weight: 213lbs Today's Weight: unkown                                                    Attempted call to wife and unable to reach.  Left detailed message, per DPR, regarding transmission.  Transmission reviewed.    Thoracic impedance abnormal suggesting fluid accumulation from 10/10/2018.   Prescribed: Furosemide40 mg take1.5 tablets (60 mgtotal)every AM and 40 mg every PM.   Labs: 09/10/2018 Creatinine 1.28, BUN 13, Potassium 3.8, Sodium 140, eGFR 59->60 11/10/2017 Creatinine 1.31, BUN 10, Potassium 4.3, Sodium 141, EGFR 61-72  Recommendations: Left voice mail with ICM number and encouraged to call if experiencing any fluid symptoms.  Follow-up plan: ICM clinic phone appointment on 10/29/2018.      Copy of ICM check sent to Dr. Rayann Heman and Dr Harl Bowie.   3 month ICM trend: 10/18/2018    1 Year ICM trend:       Rosalene Billings, RN 10/19/2018 3:20 PM

## 2018-10-29 ENCOUNTER — Ambulatory Visit (INDEPENDENT_AMBULATORY_CARE_PROVIDER_SITE_OTHER): Payer: Medicare HMO

## 2018-10-29 DIAGNOSIS — Z95810 Presence of automatic (implantable) cardiac defibrillator: Secondary | ICD-10-CM

## 2018-10-29 DIAGNOSIS — I5022 Chronic systolic (congestive) heart failure: Secondary | ICD-10-CM

## 2018-10-29 NOTE — Progress Notes (Signed)
EPIC Encounter for ICM Monitoring  Patient Name: Elza Sortor Loeber is a 52 y.o. male Date: 10/29/2018 Primary Care Physican: Monico Blitz, MD Primary Cardiologist: Harl Bowie Electrophysiologist: Allred Last Weight:213lbs Today's [place] with wife. Heart Failure questions reviewed, pt asymptomatic.   Thoracic impedance abnormal suggesting fluid accumulation starting 10/26/2018.   Prescribed: Furosemide40 mg take1.5 tablets (60 mgtotal)every AM and 40 mg every PM.   Labs: 09/10/2018 Creatinine 1.28, BUN 13, Potassium 3.8, Sodium 140, eGFR 59->60 11/10/2017 Creatinine 1.31, BUN 10, Potassium 4.3, Sodium 141, EGFR 61-72  Recommendations: Reinforced limiting salt intake to < 2000 mg daily and fluid intake to 64 oz daily.  Encouraged to call for fluid symptoms.  Follow-up plan: ICM clinic phone appointment on 11/06/2018 to recheck fluid levels.    Copy of ICM check sent to Dr. Rayann Heman and Dr Harl Bowie for review and recommendations if needed.   3 month ICM trend: 10/29/2018    1 Year ICM trend:       Rosalene Billings, RN 10/29/2018 4:27 PM

## 2018-11-09 NOTE — Progress Notes (Signed)
No ICM remote transmission received for 11/06/2018 and next ICM transmission scheduled for 12/06/2018.    

## 2018-11-12 DIAGNOSIS — Z7189 Other specified counseling: Secondary | ICD-10-CM | POA: Diagnosis not present

## 2018-11-12 DIAGNOSIS — Z6828 Body mass index (BMI) 28.0-28.9, adult: Secondary | ICD-10-CM | POA: Diagnosis not present

## 2018-11-12 DIAGNOSIS — Z1339 Encounter for screening examination for other mental health and behavioral disorders: Secondary | ICD-10-CM | POA: Diagnosis not present

## 2018-11-12 DIAGNOSIS — Z1211 Encounter for screening for malignant neoplasm of colon: Secondary | ICD-10-CM | POA: Diagnosis not present

## 2018-11-12 DIAGNOSIS — Z79899 Other long term (current) drug therapy: Secondary | ICD-10-CM | POA: Diagnosis not present

## 2018-11-12 DIAGNOSIS — Z299 Encounter for prophylactic measures, unspecified: Secondary | ICD-10-CM | POA: Diagnosis not present

## 2018-11-12 DIAGNOSIS — M109 Gout, unspecified: Secondary | ICD-10-CM | POA: Diagnosis not present

## 2018-11-12 DIAGNOSIS — Z125 Encounter for screening for malignant neoplasm of prostate: Secondary | ICD-10-CM | POA: Diagnosis not present

## 2018-11-12 DIAGNOSIS — E7800 Pure hypercholesterolemia, unspecified: Secondary | ICD-10-CM | POA: Diagnosis not present

## 2018-11-12 DIAGNOSIS — Z1331 Encounter for screening for depression: Secondary | ICD-10-CM | POA: Diagnosis not present

## 2018-11-12 DIAGNOSIS — Z0000 Encounter for general adult medical examination without abnormal findings: Secondary | ICD-10-CM | POA: Diagnosis not present

## 2018-11-12 DIAGNOSIS — I69320 Aphasia following cerebral infarction: Secondary | ICD-10-CM | POA: Diagnosis not present

## 2018-11-12 DIAGNOSIS — I509 Heart failure, unspecified: Secondary | ICD-10-CM | POA: Diagnosis not present

## 2018-11-12 DIAGNOSIS — R5383 Other fatigue: Secondary | ICD-10-CM | POA: Diagnosis not present

## 2018-11-12 DIAGNOSIS — R41841 Cognitive communication deficit: Secondary | ICD-10-CM | POA: Diagnosis not present

## 2018-11-15 DIAGNOSIS — R41841 Cognitive communication deficit: Secondary | ICD-10-CM | POA: Diagnosis not present

## 2018-11-15 DIAGNOSIS — I69320 Aphasia following cerebral infarction: Secondary | ICD-10-CM | POA: Diagnosis not present

## 2018-11-18 LAB — CUP PACEART REMOTE DEVICE CHECK
Battery Remaining Longevity: 30 mo
Battery Remaining Percentage: 27 %
Battery Voltage: 2.83 V
Brady Statistic RA Percent Paced: 1.6 %
Brady Statistic RV Percent Paced: 1 %
HIGH POWER IMPEDANCE MEASURED VALUE: 63 Ohm
HighPow Impedance: 63 Ohm
Implantable Lead Implant Date: 20120105
Implantable Lead Model: 7122
Implantable Pulse Generator Implant Date: 20120105
Lead Channel Impedance Value: 360 Ohm
Lead Channel Impedance Value: 390 Ohm
Lead Channel Pacing Threshold Amplitude: 0.75 V
Lead Channel Sensing Intrinsic Amplitude: 6.2 mV
Lead Channel Setting Pacing Amplitude: 2 V
Lead Channel Setting Pacing Pulse Width: 0.4 ms
Lead Channel Setting Sensing Sensitivity: 0.5 mV
MDC IDC LEAD IMPLANT DT: 20120105
MDC IDC LEAD LOCATION: 753859
MDC IDC LEAD LOCATION: 753860
MDC IDC MSMT LEADCHNL RA PACING THRESHOLD AMPLITUDE: 1 V
MDC IDC MSMT LEADCHNL RA PACING THRESHOLD PULSEWIDTH: 0.4 ms
MDC IDC MSMT LEADCHNL RA SENSING INTR AMPL: 2.9 mV
MDC IDC MSMT LEADCHNL RV PACING THRESHOLD PULSEWIDTH: 0.4 ms
MDC IDC SESS DTM: 20191111074502
MDC IDC SET LEADCHNL RV PACING AMPLITUDE: 2.5 V
MDC IDC STAT BRADY AP VP PERCENT: 1 %
MDC IDC STAT BRADY AP VS PERCENT: 2.6 %
MDC IDC STAT BRADY AS VP PERCENT: 1 %
MDC IDC STAT BRADY AS VS PERCENT: 94 %
Pulse Gen Serial Number: 622431

## 2018-11-19 DIAGNOSIS — I10 Essential (primary) hypertension: Secondary | ICD-10-CM | POA: Diagnosis not present

## 2018-11-19 DIAGNOSIS — I639 Cerebral infarction, unspecified: Secondary | ICD-10-CM | POA: Diagnosis not present

## 2018-11-20 DIAGNOSIS — I69320 Aphasia following cerebral infarction: Secondary | ICD-10-CM | POA: Diagnosis not present

## 2018-11-20 DIAGNOSIS — R41841 Cognitive communication deficit: Secondary | ICD-10-CM | POA: Diagnosis not present

## 2018-11-22 DIAGNOSIS — R41841 Cognitive communication deficit: Secondary | ICD-10-CM | POA: Diagnosis not present

## 2018-11-22 DIAGNOSIS — F320 Major depressive disorder, single episode, mild: Secondary | ICD-10-CM | POA: Diagnosis not present

## 2018-11-22 DIAGNOSIS — I69320 Aphasia following cerebral infarction: Secondary | ICD-10-CM | POA: Diagnosis not present

## 2018-12-03 DIAGNOSIS — I2782 Chronic pulmonary embolism: Secondary | ICD-10-CM | POA: Diagnosis not present

## 2018-12-03 DIAGNOSIS — N183 Chronic kidney disease, stage 3 (moderate): Secondary | ICD-10-CM | POA: Diagnosis not present

## 2018-12-03 DIAGNOSIS — I480 Paroxysmal atrial fibrillation: Secondary | ICD-10-CM | POA: Diagnosis not present

## 2018-12-03 DIAGNOSIS — G40822 Epileptic spasms, not intractable, without status epilepticus: Secondary | ICD-10-CM | POA: Diagnosis not present

## 2018-12-03 DIAGNOSIS — I509 Heart failure, unspecified: Secondary | ICD-10-CM | POA: Diagnosis not present

## 2018-12-03 DIAGNOSIS — I69351 Hemiplegia and hemiparesis following cerebral infarction affecting right dominant side: Secondary | ICD-10-CM | POA: Diagnosis not present

## 2018-12-03 DIAGNOSIS — Z6828 Body mass index (BMI) 28.0-28.9, adult: Secondary | ICD-10-CM | POA: Diagnosis not present

## 2018-12-03 DIAGNOSIS — E1122 Type 2 diabetes mellitus with diabetic chronic kidney disease: Secondary | ICD-10-CM | POA: Diagnosis not present

## 2018-12-03 DIAGNOSIS — I130 Hypertensive heart and chronic kidney disease with heart failure and stage 1 through stage 4 chronic kidney disease, or unspecified chronic kidney disease: Secondary | ICD-10-CM | POA: Diagnosis not present

## 2018-12-06 ENCOUNTER — Ambulatory Visit (INDEPENDENT_AMBULATORY_CARE_PROVIDER_SITE_OTHER): Payer: Medicare HMO

## 2018-12-06 ENCOUNTER — Telehealth: Payer: Self-pay | Admitting: *Deleted

## 2018-12-06 DIAGNOSIS — I5022 Chronic systolic (congestive) heart failure: Secondary | ICD-10-CM

## 2018-12-06 DIAGNOSIS — Z95810 Presence of automatic (implantable) cardiac defibrillator: Secondary | ICD-10-CM | POA: Diagnosis not present

## 2018-12-06 NOTE — Telephone Encounter (Signed)
Spoke with patient regarding persistent AF since 12/03/18. Patient denies any symptoms, including ShOB, palpitations, weight gain, chest discomfort, or LE edema. His [facility] RN came by on Monday and said he looked good. He reports compliance with medications, including Eliquis and sotalol. Medications are supplied in bubble-packs from his pharmacy.   Patient reports he is going through a rough time right now. His wife recently left him and his brother died unexpectedly on 03/03/23. He is trying to "get everything in order" as his wife took care of a lot. Encouraged him to remain compliant with medications and to continue taking care of his needs. His brother's burial is planned for 2/8.  Advised patient that I will route this note to Dr. Johney Frame for review and recommendations. Will also route to Randon Goldsmith, RN, as patient is followed in [facility] and [employer] is currently at baseline. He verbalize understanding and agrees to call our office if he develops any symptoms.     Presenting rhythm as of 11/26/18 at 15:10:

## 2018-12-06 NOTE — Telephone Encounter (Signed)
Follow-up with me or EP APP at next available.  Not urgent. Do not overbook.

## 2018-12-07 NOTE — Progress Notes (Signed)
EPIC Encounter for ICM Monitoring  Patient Name: Adam [name] is a 53 y.o. male Date: 12/07/2018 Primary Care Physican: Monico Blitz, MD Primary Cardiologist: Branch Electrophysiologist: Allred Last Weight:213lbs Today's Weight:[place]  AT/AF Burden 1.1 % since 4/5/209 and most recent episode 12/03/2018.                                                  Transmission reviewed.  Levander Campion, device RN contacted patient 12/06/2018 regarding persistent AF since 12/03/2018 and fluid level follow up.  He denied any symptoms, including ShOB, palpitations, weight gain, chest discomfort, or LE edema.   Patient is going through stressful time, his wife left and his brother died unexpectedly within the last week.  He is trying to "get everything in order" as his wife took care of a lot.   Thoracic impedance normal.   Prescribed: Furosemide40 mg take1.5 tablets (60 mgtotal)every AM and 40 mg every PM.   Labs: 09/10/2018 Creatinine 1.28, BUN 13, Potassium 3.8, Sodium 140, eGFR 59->60 11/10/2017 Creatinine 1.31, BUN 10, Potassium 4.3, Sodium 141, EGFR 61-72  Recommendations: None  Follow-up plan: ICM clinic phone appointment on 01/08/2019.  Office visit scheduled with Dr Harl Bowie 01/22/2019 and Dr Rayann Heman 02/08/2019.  Copy of ICM check sent to Dr. Rayann Heman   3 month ICM trend: 12/06/2018    1 Year ICM trend:       Rosalene Billings, RN 12/07/2018 9:27 AM

## 2018-12-13 NOTE — Telephone Encounter (Signed)
Spoke with patient. He reports he has difficulty getting to the [facility] office as he relies on rides from others. [facility] is more convenient for him, though Dr. Jenel Lucks 01/11/19 schedule is fully booked. Patient's next OV with Dr. Wyline Mood is on 01/22/19 and next scheduled OV with Dr. Johney Frame is on 02/08/19. Advised I will route this message to Dr. Johney Frame for any additional recommendations. He is agreeable to this plan.

## 2018-12-14 NOTE — Telephone Encounter (Signed)
I am happy to see in [place].

## 2018-12-17 ENCOUNTER — Ambulatory Visit (INDEPENDENT_AMBULATORY_CARE_PROVIDER_SITE_OTHER): Payer: Medicare HMO

## 2018-12-17 DIAGNOSIS — I5022 Chronic systolic (congestive) heart failure: Secondary | ICD-10-CM | POA: Diagnosis not present

## 2018-12-17 DIAGNOSIS — I428 Other cardiomyopathies: Secondary | ICD-10-CM

## 2018-12-17 NOTE — Telephone Encounter (Signed)
Routed to scheduler for assistance.

## 2018-12-18 DIAGNOSIS — I10 Essential (primary) hypertension: Secondary | ICD-10-CM | POA: Diagnosis not present

## 2018-12-18 DIAGNOSIS — I639 Cerebral infarction, unspecified: Secondary | ICD-10-CM | POA: Diagnosis not present

## 2018-12-19 LAB — CUP PACEART REMOTE DEVICE CHECK
Brady Statistic AP VP Percent: 1 %
Brady Statistic AP VS Percent: 4.5 %
Brady Statistic AS VP Percent: 1 %
Brady Statistic AS VS Percent: 92 %
Brady Statistic RV Percent Paced: 1 %
HighPow Impedance: 61 Ohm
HighPow Impedance: 61 Ohm
Implantable Lead Implant Date: 20120105
Implantable Lead Implant Date: 20120105
Implantable Lead Location: 753859
Implantable Pulse Generator Implant Date: 20120105
Lead Channel Impedance Value: 380 Ohm
Lead Channel Pacing Threshold Amplitude: 0.75 V
Lead Channel Pacing Threshold Amplitude: 1 V
Lead Channel Pacing Threshold Pulse Width: 0.4 ms
Lead Channel Sensing Intrinsic Amplitude: 3.3 mV
Lead Channel Sensing Intrinsic Amplitude: 6.5 mV
Lead Channel Setting Pacing Amplitude: 2.5 V
Lead Channel Setting Pacing Pulse Width: 0.4 ms
MDC IDC LEAD LOCATION: 753860
MDC IDC MSMT BATTERY REMAINING LONGEVITY: 26 mo
MDC IDC MSMT BATTERY REMAINING PERCENTAGE: 24 %
MDC IDC MSMT BATTERY VOLTAGE: 2.8 V
MDC IDC MSMT LEADCHNL RA IMPEDANCE VALUE: 340 Ohm
MDC IDC MSMT LEADCHNL RA PACING THRESHOLD PULSEWIDTH: 0.4 ms
MDC IDC PG SERIAL: 622431
MDC IDC SESS DTM: 20200210221421
MDC IDC SET LEADCHNL RA PACING AMPLITUDE: 2 V
MDC IDC SET LEADCHNL RV SENSING SENSITIVITY: 0.5 mV
MDC IDC STAT BRADY RA PERCENT PACED: 3.5 %

## 2018-12-27 DIAGNOSIS — R41841 Cognitive communication deficit: Secondary | ICD-10-CM | POA: Diagnosis not present

## 2018-12-27 DIAGNOSIS — I69320 Aphasia following cerebral infarction: Secondary | ICD-10-CM | POA: Diagnosis not present

## 2018-12-28 ENCOUNTER — Telehealth: Payer: Self-pay | Admitting: Internal Medicine

## 2018-12-28 DIAGNOSIS — Z6829 Body mass index (BMI) 29.0-29.9, adult: Secondary | ICD-10-CM | POA: Diagnosis not present

## 2018-12-28 DIAGNOSIS — I10 Essential (primary) hypertension: Secondary | ICD-10-CM | POA: Diagnosis not present

## 2018-12-28 DIAGNOSIS — Z299 Encounter for prophylactic measures, unspecified: Secondary | ICD-10-CM | POA: Diagnosis not present

## 2018-12-28 DIAGNOSIS — Z789 Other specified health status: Secondary | ICD-10-CM | POA: Diagnosis not present

## 2018-12-28 DIAGNOSIS — I4891 Unspecified atrial fibrillation: Secondary | ICD-10-CM | POA: Diagnosis not present

## 2018-12-28 DIAGNOSIS — F329 Major depressive disorder, single episode, unspecified: Secondary | ICD-10-CM | POA: Diagnosis not present

## 2018-12-28 DIAGNOSIS — I509 Heart failure, unspecified: Secondary | ICD-10-CM | POA: Diagnosis not present

## 2018-12-28 NOTE — Telephone Encounter (Signed)
Contacted patient who says he is currently at PCP office for a visit. Reported feeling palpitations for the past 2 days that occurs more in the daytime. Patient said he would call back to give more details of symptoms since he wasn't able to finish conversation. Awaiting call back from patient.

## 2018-12-28 NOTE — Telephone Encounter (Signed)
Patient walk in - just not feeling right.  Said heart rate has not been normal lately  Said that he was fine at the moment.   It Mostly is happening at night.   He is also concerned about when he is due to have battery change with his device

## 2018-12-28 NOTE — Progress Notes (Signed)
Remote ICD transmission.   

## 2019-01-01 NOTE — Telephone Encounter (Signed)
Spoke with patient and he says that all of his symptoms are better now. He said when he saw his PCP last week, he was given a medication to help his stress level. Patient advised to call our office back if his symptoms returned and to keep his office visit scheduled for next week. Verbalized understanding.

## 2019-01-03 ENCOUNTER — Encounter (HOSPITAL_COMMUNITY): Payer: Self-pay | Admitting: Emergency Medicine

## 2019-01-03 ENCOUNTER — Emergency Department (HOSPITAL_COMMUNITY)
Admission: EM | Admit: 2019-01-03 | Discharge: 2019-01-03 | Disposition: A | Discharge status: Home / Self Care | Payer: Medicare HMO | Source: Home / Self Care | Attending: Emergency Medicine | Admitting: Emergency Medicine

## 2019-01-03 DIAGNOSIS — E669 Obesity, unspecified: Secondary | ICD-10-CM | POA: Diagnosis present

## 2019-01-03 DIAGNOSIS — G4733 Obstructive sleep apnea (adult) (pediatric): Secondary | ICD-10-CM | POA: Diagnosis present

## 2019-01-03 DIAGNOSIS — R1110 Vomiting, unspecified: Secondary | ICD-10-CM | POA: Diagnosis not present

## 2019-01-03 DIAGNOSIS — Z95810 Presence of automatic (implantable) cardiac defibrillator: Secondary | ICD-10-CM

## 2019-01-03 DIAGNOSIS — R188 Other ascites: Secondary | ICD-10-CM | POA: Diagnosis not present

## 2019-01-03 DIAGNOSIS — N170 Acute kidney failure with tubular necrosis: Secondary | ICD-10-CM | POA: Diagnosis not present

## 2019-01-03 DIAGNOSIS — G9340 Encephalopathy, unspecified: Secondary | ICD-10-CM | POA: Diagnosis present

## 2019-01-03 DIAGNOSIS — Z66 Do not resuscitate: Secondary | ICD-10-CM | POA: Diagnosis present

## 2019-01-03 DIAGNOSIS — M109 Gout, unspecified: Secondary | ICD-10-CM | POA: Diagnosis not present

## 2019-01-03 DIAGNOSIS — I5022 Chronic systolic (congestive) heart failure: Secondary | ICD-10-CM

## 2019-01-03 DIAGNOSIS — E874 Mixed disorder of acid-base balance: Secondary | ICD-10-CM | POA: Diagnosis not present

## 2019-01-03 DIAGNOSIS — G253 Myoclonus: Secondary | ICD-10-CM | POA: Diagnosis present

## 2019-01-03 DIAGNOSIS — I4892 Unspecified atrial flutter: Secondary | ICD-10-CM | POA: Diagnosis present

## 2019-01-03 DIAGNOSIS — Z515 Encounter for palliative care: Secondary | ICD-10-CM | POA: Diagnosis present

## 2019-01-03 DIAGNOSIS — K7200 Acute and subacute hepatic failure without coma: Secondary | ICD-10-CM | POA: Diagnosis not present

## 2019-01-03 DIAGNOSIS — Z452 Encounter for adjustment and management of vascular access device: Secondary | ICD-10-CM | POA: Diagnosis not present

## 2019-01-03 DIAGNOSIS — R570 Cardiogenic shock: Secondary | ICD-10-CM | POA: Diagnosis present

## 2019-01-03 DIAGNOSIS — R1013 Epigastric pain: Secondary | ICD-10-CM

## 2019-01-03 DIAGNOSIS — I428 Other cardiomyopathies: Secondary | ICD-10-CM | POA: Diagnosis present

## 2019-01-03 DIAGNOSIS — Z4682 Encounter for fitting and adjustment of non-vascular catheter: Secondary | ICD-10-CM | POA: Diagnosis not present

## 2019-01-03 DIAGNOSIS — I482 Chronic atrial fibrillation, unspecified: Secondary | ICD-10-CM | POA: Diagnosis not present

## 2019-01-03 DIAGNOSIS — Z79899 Other long term (current) drug therapy: Secondary | ICD-10-CM

## 2019-01-03 DIAGNOSIS — A419 Sepsis, unspecified organism: Secondary | ICD-10-CM | POA: Diagnosis not present

## 2019-01-03 DIAGNOSIS — I2510 Atherosclerotic heart disease of native coronary artery without angina pectoris: Secondary | ICD-10-CM | POA: Diagnosis not present

## 2019-01-03 DIAGNOSIS — E872 Acidosis: Secondary | ICD-10-CM | POA: Diagnosis not present

## 2019-01-03 DIAGNOSIS — Z9911 Dependence on respirator [ventilator] status: Secondary | ICD-10-CM | POA: Diagnosis not present

## 2019-01-03 DIAGNOSIS — R6521 Severe sepsis with septic shock: Secondary | ICD-10-CM | POA: Diagnosis not present

## 2019-01-03 DIAGNOSIS — I9589 Other hypotension: Secondary | ICD-10-CM | POA: Diagnosis present

## 2019-01-03 DIAGNOSIS — R079 Chest pain, unspecified: Secondary | ICD-10-CM | POA: Diagnosis not present

## 2019-01-03 DIAGNOSIS — R112 Nausea with vomiting, unspecified: Secondary | ICD-10-CM | POA: Diagnosis present

## 2019-01-03 DIAGNOSIS — J9601 Acute respiratory failure with hypoxia: Secondary | ICD-10-CM | POA: Diagnosis present

## 2019-01-03 DIAGNOSIS — I469 Cardiac arrest, cause unspecified: Secondary | ICD-10-CM | POA: Diagnosis not present

## 2019-01-03 DIAGNOSIS — I472 Ventricular tachycardia: Secondary | ICD-10-CM | POA: Diagnosis present

## 2019-01-03 DIAGNOSIS — N183 Chronic kidney disease, stage 3 (moderate): Secondary | ICD-10-CM | POA: Diagnosis present

## 2019-01-03 DIAGNOSIS — E875 Hyperkalemia: Secondary | ICD-10-CM | POA: Diagnosis not present

## 2019-01-03 DIAGNOSIS — R0602 Shortness of breath: Secondary | ICD-10-CM | POA: Diagnosis not present

## 2019-01-03 DIAGNOSIS — J189 Pneumonia, unspecified organism: Secondary | ICD-10-CM | POA: Diagnosis not present

## 2019-01-03 DIAGNOSIS — N179 Acute kidney failure, unspecified: Secondary | ICD-10-CM | POA: Diagnosis not present

## 2019-01-03 DIAGNOSIS — I5082 Biventricular heart failure: Secondary | ICD-10-CM | POA: Diagnosis present

## 2019-01-03 DIAGNOSIS — I5023 Acute on chronic systolic (congestive) heart failure: Secondary | ICD-10-CM | POA: Diagnosis present

## 2019-01-03 DIAGNOSIS — E860 Dehydration: Secondary | ICD-10-CM | POA: Diagnosis not present

## 2019-01-03 DIAGNOSIS — I4819 Other persistent atrial fibrillation: Secondary | ICD-10-CM | POA: Diagnosis present

## 2019-01-03 DIAGNOSIS — I429 Cardiomyopathy, unspecified: Secondary | ICD-10-CM | POA: Diagnosis not present

## 2019-01-03 DIAGNOSIS — I340 Nonrheumatic mitral (valve) insufficiency: Secondary | ICD-10-CM | POA: Diagnosis present

## 2019-01-03 DIAGNOSIS — R34 Anuria and oliguria: Secondary | ICD-10-CM | POA: Diagnosis not present

## 2019-01-03 LAB — CBC
HCT: 40 % (ref 39.0–52.0)
Hemoglobin: 12.7 g/dL — ABNORMAL LOW (ref 13.0–17.0)
MCH: 30.5 pg (ref 26.0–34.0)
MCHC: 31.8 g/dL (ref 30.0–36.0)
MCV: 95.9 fL (ref 80.0–100.0)
NRBC: 0 % (ref 0.0–0.2)
Platelets: 257 10*3/uL (ref 150–400)
RBC: 4.17 MIL/uL — ABNORMAL LOW (ref 4.22–5.81)
RDW: 14.8 % (ref 11.5–15.5)
WBC: 7.1 10*3/uL (ref 4.0–10.5)

## 2019-01-03 LAB — LIPASE, BLOOD: Lipase: 40 U/L (ref 11–51)

## 2019-01-03 LAB — COMPREHENSIVE METABOLIC PANEL
ALT: 17 U/L (ref 0–44)
AST: 19 U/L (ref 15–41)
Albumin: 4.2 g/dL (ref 3.5–5.0)
Alkaline Phosphatase: 44 U/L (ref 38–126)
Anion gap: 9 (ref 5–15)
BUN: 18 mg/dL (ref 6–20)
CO2: 25 mmol/L (ref 22–32)
Calcium: 8.7 mg/dL — ABNORMAL LOW (ref 8.9–10.3)
Chloride: 103 mmol/L (ref 98–111)
Creatinine, Ser: 1.64 mg/dL — ABNORMAL HIGH (ref 0.61–1.24)
GFR calc Af Amer: 55 mL/min — ABNORMAL LOW (ref >60)
GFR calc non Af Amer: 47 mL/min — ABNORMAL LOW (ref >60)
Glucose, Bld: 113 mg/dL — ABNORMAL HIGH (ref 70–99)
POTASSIUM: 4.7 mmol/L (ref 3.5–5.1)
Sodium: 137 mmol/L (ref 135–145)
Total Bilirubin: 3.5 mg/dL — ABNORMAL HIGH (ref 0.3–1.2)
Total Protein: 7.4 g/dL (ref 6.5–8.1)

## 2019-01-03 LAB — URINALYSIS, ROUTINE W REFLEX MICROSCOPIC
Bilirubin Urine: NEGATIVE
GLUCOSE, UA: NEGATIVE mg/dL
Hgb urine dipstick: NEGATIVE
Ketones, ur: NEGATIVE mg/dL
Leukocytes,Ua: NEGATIVE
Nitrite: NEGATIVE
Protein, ur: 100 mg/dL — AB
Specific Gravity, Urine: 1.02 (ref 1.005–1.030)
pH: 5 (ref 5.0–8.0)

## 2019-01-03 MED ORDER — ONDANSETRON HCL 4 MG/2ML IJ SOLN
4.0000 mg | Freq: Once | INTRAMUSCULAR | Status: AC
  Administered 2019-01-03: 4 mg via INTRAVENOUS
  Filled 2019-01-03: qty 2

## 2019-01-03 MED ORDER — PANTOPRAZOLE SODIUM 20 MG PO TBEC: 20.0000 mg | DELAYED_RELEASE_TABLET | Freq: Every day | ORAL | 0 refills | Status: AC

## 2019-01-03 MED ORDER — SODIUM CHLORIDE 0.9 % IV BOLUS (SEPSIS)
1000.0000 mL | Freq: Once | INTRAVENOUS | Status: AC
  Administered 2019-01-03: 1000 mL via INTRAVENOUS

## 2019-01-03 MED ORDER — SODIUM CHLORIDE 0.9% FLUSH
3.0000 mL | Freq: Once | INTRAVENOUS | Status: AC
  Administered 2019-01-03: 3 mL via INTRAVENOUS

## 2019-01-03 MED ORDER — ONDANSETRON 4 MG PO TBDP: ORAL_TABLET | ORAL | 0 refills | Status: AC

## 2019-01-03 NOTE — ED Triage Notes (Signed)
Pt c/o emesis x 2 days.

## 2019-01-03 NOTE — ED Provider Notes (Signed)
[facility] EMERGENCY DEPARTMENT Provider Note   CSN: [id number] Arrival date & time: 01/03/19  0037    History   Chief Complaint Chief Complaint  Patient presents with  . Emesis    HPI Adam [name] is a 53 y.o. male.     Patient complains of nausea.  And vomiting  The history is provided by the patient. No language interpreter was used.  Emesis  Severity:  Moderate Timing:  Constant Quality:  Bilious material Able to tolerate:  Liquids Progression:  Worsening Chronicity:  New Recent urination:  Normal Context: not post-tussive   Relieved by:  Nothing Worsened by:  Nothing Associated symptoms: no abdominal pain, no cough, no diarrhea and no headaches     Past Medical History:  Diagnosis Date  . A-fib (HCC)   . Chronic systolic heart failure (HCC)   . CVA (cerebral infarction)   . Disability examination    Discussion of disability September, 2013  . Drug therapy    Sotalol started for VT storm  July, 2013  . Ejection fraction < 50%    Echo 06/05/12: Mild LVH, EF 20%, posterior severe HK, anterolateral severe HK, anterior severe HK, mid to apical septal AK, AK of the true apex, apical inferior HK, restrictive physiology, trivial MR, moderate LAE, mildly reduced RV function, mild TR.  Marland Kitchen Headache(784.0)    Patient seen to have headaches from spironolactone.  Drug was stopped, August, 2012  . Hypokalemia    There was some decrease in potassium with his admission in July, 2013. We will watch this very carefully.  . ICD (implantable cardiac defibrillator) battery depletion 11/11/10   St. Jude, Dr.Allred  . 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 Mitral regurgitation   . NICM (nonischemic cardiomyopathy) (HCC)    Nonischemic / catheterization October, 2011, normal coronary arteries  . Renal insufficiency    Creatinine 1.5 (GFR greater than 50)  . Sleepiness    has some sleepiness during the day  . VT (ventricular tachycardia) (HCC)    VT storm 7/13  - Sotalol started    Patient Active Problem List   Diagnosis Date Noted  . Abdominal pain 06/27/2016  . Nausea with vomiting 06/27/2016  . Diarrhea 06/27/2016  . Lightheadedness 01/24/2014  . Nonischemic cardiomyopathy (HCC) 10/29/2013  . Chronic systolic CHF (congestive heart failure) (HCC) 04/26/2013  . Chest discomfort 04/26/2013  . Bradycardia 04/26/2013  . Automatic implantable cardioverter-defibrillator in situ 09/04/2012  . Hypokalemia   . Disability examination   . Drug therapy   . Ventricular tachyarrhythmia (HCC) 06/06/2012  . Headache(784.0)   . Sleepiness   . Mitral regurgitation   . Cardiomyopathy (HCC)   . Ejection fraction < 50%   . Renal insufficiency     Past Surgical History:  Procedure Laterality Date  . CARDIAC DEFIBRILLATOR PLACEMENT  11/11/10   SJM Fortify DR ICD implanted by Dr Johney Frame  . COLONOSCOPY N/A 07/13/2016   Procedure: COLONOSCOPY;  Surgeon: Corbin Ade, MD;  Location: [facility] ENDO SUITE;  Service: Endoscopy;  Laterality: N/A;  12:00 pm - pt can't move up due to transportation  . TEE WITH CARDIOVERSION  11/17/08   No LV clot        Home Medications    Prior to Admission medications   Medication Sig Start Date End Date Taking? Authorizing Provider  allopurinol (ZYLOPRIM) 300 MG tablet Take 300 mg by mouth every morning.  06/03/16  Yes [provider]  atorvastatin (LIPITOR) 80 MG tablet  Take 80 mg by mouth at bedtime. 01/20/16  Yes [provider]  carvedilol (COREG) 12.5 MG tablet Take 12.5 mg by mouth 2 (two) times daily with a meal.   Yes [provider]  ELIQUIS 5 MG TABS tablet Take 5 mg by mouth 2 (two) times daily. 01/20/16  Yes [provider]  furosemide (LASIX) 40 MG tablet TAKE 1 AND 1/2 TABLETS BY MOUTH IN THE MORNING & TAKE ONE TABLET BY MOUTH IN THE EVENING Patient taking differently: Take 40-60 mg by mouth See admin instructions. TAKE 1 AND 1/2 TABLETS BY MOUTH IN THE MORNING & TAKE ONE TABLET BY  MOUTH IN THE EVENING 08/02/18  Yes Branch, Dorothe Pea, MD  irbesartan (AVAPRO) 75 MG tablet TAKE ONE TABLET BY MOUTH DAILY. STOP ENTRESTO. Patient taking differently: Take 75 mg by mouth every evening.  08/01/18  Yes Branch, Dorothe Pea, MD  levETIRAcetam (KEPPRA) 500 MG tablet TAKE ONE TABLET BY MOUTH TWICE DAILY Patient taking differently: Take 500 mg by mouth 2 (two) times daily.  06/05/18  Yes Jaffe, Adam R, DO  potassium chloride (K-DUR,KLOR-CON) 10 MEQ tablet Take 10 mEq by mouth 2 (two) times daily.  09/03/18  Yes [provider]  SOTALOL AF 120 MG TABS TAKE ONE TABLET BY MOUTH EVERY TWELVE HOURS Patient taking differently: Take 120 mg by mouth every 12 (twelve) hours.  07/02/18  Yes Allred, Fayrene Fearing, MD  ondansetron (ZOFRAN ODT) 4 MG disintegrating tablet 4mg  ODT q4 hours prn nausea/vomit 01/03/19   Bethann Berkshire, MD  pantoprazole (PROTONIX) 20 MG tablet Take 1 tablet (20 mg total) by mouth daily. 01/03/19   Bethann Berkshire, MD    Family History Family History  Problem Relation Age of Onset  . Heart attack Father 74       Multiple MIs  . Stroke Father   . Heart attack Brother   . Heart failure Brother        CHF  . Hypertension Mother   . Stroke Mother   . Heart attack Paternal Grandfather   . Colon cancer Neg Hx     Social History Social History   Tobacco Use  . Smoking status: Never Smoker  . Smokeless tobacco: Never Used  Substance Use Topics  . Alcohol use: Yes    Alcohol/week: 0.0 standard drinks    Comment: OCCASIONAL  (NOT HEAVY)  . Drug use: No     Allergies   Patient has no known allergies.   Review of Systems Review of Systems  Constitutional: Negative for appetite change and fatigue.  HENT: Negative for congestion, ear discharge and sinus pressure.   Eyes: Negative for discharge.  Respiratory: Negative for cough.   Cardiovascular: Negative for chest pain.  Gastrointestinal: Positive for vomiting. Negative for abdominal pain and diarrhea.    Genitourinary: Negative for frequency and hematuria.  Musculoskeletal: Negative for back pain.  Skin: Negative for rash.  Neurological: Negative for seizures and headaches.  Psychiatric/Behavioral: Negative for hallucinations.     Physical Exam Updated Vital Signs BP 110/79   Pulse (!) 53   Temp 97.6 F (36.4 C) (Oral)   Resp 17   Ht 5\' 11"  (1.803 m)   Wt 90.7 kg   SpO2 95%   BMI 27.89 kg/m   Physical Exam Vitals signs and nursing note reviewed.  Constitutional:      Appearance: He is well-developed.  HENT:     Head: Normocephalic.     Nose: Nose normal.  Eyes:  General: No scleral icterus.    Conjunctiva/sclera: Conjunctivae normal.  Neck:     Musculoskeletal: Neck supple.     Thyroid: No thyromegaly.  Cardiovascular:     Rate and Rhythm: Normal rate and regular rhythm.     Heart sounds: No murmur. No friction rub. No gallop.   Pulmonary:     Breath sounds: No stridor. No wheezing or rales.  Chest:     Chest wall: No tenderness.  Abdominal:     General: There is no distension.     Tenderness: There is no abdominal tenderness. There is no rebound.  Musculoskeletal: Normal range of motion.  Lymphadenopathy:     Cervical: No cervical adenopathy.  Skin:    Findings: No erythema or rash.  Neurological:     Mental Status: He is oriented to person, place, and time.     Motor: No abnormal muscle tone.     Coordination: Coordination normal.  Psychiatric:        Behavior: Behavior normal.      ED Treatments / Results  Labs (all labs ordered are listed, but only abnormal results are displayed) Labs Reviewed  COMPREHENSIVE METABOLIC PANEL - Abnormal; Notable for the following components:      Result Value   Glucose, Bld 113 (*)    Creatinine, Ser 1.64 (*)    Calcium 8.7 (*)    Total Bilirubin 3.5 (*)    GFR calc non Af Amer 47 (*)    GFR calc Af Amer 55 (*)    All other components within normal limits  CBC - Abnormal; Notable for the following  components:   RBC 4.17 (*)    Hemoglobin 12.7 (*)    All other components within normal limits  URINALYSIS, ROUTINE W REFLEX MICROSCOPIC - Abnormal; Notable for the following components:   APPearance HAZY (*)    Protein, ur 100 (*)    Bacteria, UA RARE (*)    All other components within normal limits  LIPASE, BLOOD    EKG None  Radiology No results found.  Procedures Procedures (including critical care time)  Medications Ordered in ED Medications  sodium chloride flush (NS) 0.9 % injection 3 mL (3 mLs Intravenous Given 01/03/19 2045)  ondansetron (ZOFRAN) injection 4 mg (4 mg Intravenous Given 01/03/19 2124)  sodium chloride 0.9 % bolus 1,000 mL (0 mLs Intravenous Stopped 01/03/19 2222)     Initial Impression / Assessment and Plan / ED Course  I have reviewed the triage vital signs and the nursing notes.  Pertinent labs & imaging results that were available during my care of the patient were reviewed by me and considered in my medical decision making (see chart for details).        Labs unremarkable.  Patient improved with Zofran.  He will be sent home on Protonix and Zofran will follow-up with PCP Final Clinical Impressions(s) / ED Diagnoses   Final diagnoses:  Epigastric pain    ED Discharge Orders         Ordered    pantoprazole (PROTONIX) 20 MG tablet  Daily     01/03/19 2254    ondansetron (ZOFRAN ODT) 4 MG disintegrating tablet     01/03/19 2254           Bethann Berkshire, MD 01/03/19 2256

## 2019-01-03 NOTE — Discharge Instructions (Addendum)
Follow-up with your family doctor next week for recheck. 

## 2019-01-05 DIAGNOSIS — I469 Cardiac arrest, cause unspecified: Secondary | ICD-10-CM | POA: Diagnosis not present

## 2019-01-05 DIAGNOSIS — R6521 Severe sepsis with septic shock: Secondary | ICD-10-CM | POA: Diagnosis not present

## 2019-01-05 DIAGNOSIS — A419 Sepsis, unspecified organism: Secondary | ICD-10-CM | POA: Diagnosis not present

## 2019-01-05 DIAGNOSIS — I429 Cardiomyopathy, unspecified: Secondary | ICD-10-CM | POA: Diagnosis not present

## 2019-01-05 DIAGNOSIS — Z452 Encounter for adjustment and management of vascular access device: Secondary | ICD-10-CM | POA: Diagnosis not present

## 2019-01-05 DIAGNOSIS — R188 Other ascites: Secondary | ICD-10-CM | POA: Diagnosis not present

## 2019-01-05 DIAGNOSIS — I2510 Atherosclerotic heart disease of native coronary artery without angina pectoris: Secondary | ICD-10-CM | POA: Diagnosis not present

## 2019-01-05 DIAGNOSIS — R0602 Shortness of breath: Secondary | ICD-10-CM | POA: Diagnosis not present

## 2019-01-05 DIAGNOSIS — E860 Dehydration: Secondary | ICD-10-CM | POA: Diagnosis not present

## 2019-01-05 DIAGNOSIS — M109 Gout, unspecified: Secondary | ICD-10-CM | POA: Diagnosis not present

## 2019-01-05 DIAGNOSIS — R112 Nausea with vomiting, unspecified: Secondary | ICD-10-CM | POA: Diagnosis not present

## 2019-01-05 DIAGNOSIS — Z95810 Presence of automatic (implantable) cardiac defibrillator: Secondary | ICD-10-CM | POA: Diagnosis not present

## 2019-01-05 DIAGNOSIS — I482 Chronic atrial fibrillation, unspecified: Secondary | ICD-10-CM | POA: Diagnosis not present

## 2019-01-05 DIAGNOSIS — J189 Pneumonia, unspecified organism: Secondary | ICD-10-CM | POA: Diagnosis not present

## 2019-01-06 ENCOUNTER — Inpatient Hospital Stay (HOSPITAL_COMMUNITY): Payer: Medicare HMO

## 2019-01-06 ENCOUNTER — Other Ambulatory Visit: Payer: Self-pay

## 2019-01-06 ENCOUNTER — Encounter (HOSPITAL_COMMUNITY)
Admission: AD | Disposition: E | Payer: Self-pay | Source: Other Acute Inpatient Hospital | Attending: Emergency Medicine

## 2019-01-06 ENCOUNTER — Inpatient Hospital Stay (HOSPITAL_COMMUNITY)
Admission: AD | Admit: 2019-01-06 | Discharge: 2019-02-06 | DRG: 270 | Disposition: E | Payer: Medicare HMO | Source: Other Acute Inpatient Hospital | Attending: Emergency Medicine | Admitting: Emergency Medicine

## 2019-01-06 DIAGNOSIS — E669 Obesity, unspecified: Secondary | ICD-10-CM | POA: Diagnosis present

## 2019-01-06 DIAGNOSIS — I4892 Unspecified atrial flutter: Secondary | ICD-10-CM | POA: Diagnosis present

## 2019-01-06 DIAGNOSIS — I69398 Other sequelae of cerebral infarction: Secondary | ICD-10-CM

## 2019-01-06 DIAGNOSIS — J9601 Acute respiratory failure with hypoxia: Secondary | ICD-10-CM | POA: Diagnosis present

## 2019-01-06 DIAGNOSIS — I428 Other cardiomyopathies: Secondary | ICD-10-CM | POA: Diagnosis present

## 2019-01-06 DIAGNOSIS — R34 Anuria and oliguria: Secondary | ICD-10-CM | POA: Diagnosis not present

## 2019-01-06 DIAGNOSIS — I4819 Other persistent atrial fibrillation: Secondary | ICD-10-CM | POA: Diagnosis present

## 2019-01-06 DIAGNOSIS — I469 Cardiac arrest, cause unspecified: Secondary | ICD-10-CM | POA: Diagnosis not present

## 2019-01-06 DIAGNOSIS — K7200 Acute and subacute hepatic failure without coma: Secondary | ICD-10-CM | POA: Diagnosis not present

## 2019-01-06 DIAGNOSIS — N170 Acute kidney failure with tubular necrosis: Secondary | ICD-10-CM | POA: Diagnosis not present

## 2019-01-06 DIAGNOSIS — I472 Ventricular tachycardia, unspecified: Secondary | ICD-10-CM

## 2019-01-06 DIAGNOSIS — Z79899 Other long term (current) drug therapy: Secondary | ICD-10-CM

## 2019-01-06 DIAGNOSIS — E872 Acidosis, unspecified: Secondary | ICD-10-CM | POA: Diagnosis present

## 2019-01-06 DIAGNOSIS — I5023 Acute on chronic systolic (congestive) heart failure: Secondary | ICD-10-CM | POA: Diagnosis present

## 2019-01-06 DIAGNOSIS — Z9911 Dependence on respirator [ventilator] status: Secondary | ICD-10-CM | POA: Diagnosis not present

## 2019-01-06 DIAGNOSIS — I9589 Other hypotension: Secondary | ICD-10-CM | POA: Diagnosis present

## 2019-01-06 DIAGNOSIS — Z66 Do not resuscitate: Secondary | ICD-10-CM | POA: Diagnosis present

## 2019-01-06 DIAGNOSIS — E8729 Other acidosis: Secondary | ICD-10-CM | POA: Diagnosis present

## 2019-01-06 DIAGNOSIS — Z515 Encounter for palliative care: Secondary | ICD-10-CM | POA: Diagnosis present

## 2019-01-06 DIAGNOSIS — I5082 Biventricular heart failure: Secondary | ICD-10-CM | POA: Diagnosis present

## 2019-01-06 DIAGNOSIS — Z8249 Family history of ischemic heart disease and other diseases of the circulatory system: Secondary | ICD-10-CM

## 2019-01-06 DIAGNOSIS — E874 Mixed disorder of acid-base balance: Secondary | ICD-10-CM | POA: Diagnosis not present

## 2019-01-06 DIAGNOSIS — G4733 Obstructive sleep apnea (adult) (pediatric): Secondary | ICD-10-CM | POA: Diagnosis present

## 2019-01-06 DIAGNOSIS — G253 Myoclonus: Secondary | ICD-10-CM | POA: Diagnosis present

## 2019-01-06 DIAGNOSIS — R570 Cardiogenic shock: Secondary | ICD-10-CM | POA: Diagnosis present

## 2019-01-06 DIAGNOSIS — G9340 Encephalopathy, unspecified: Secondary | ICD-10-CM | POA: Diagnosis present

## 2019-01-06 DIAGNOSIS — N183 Chronic kidney disease, stage 3 (moderate): Secondary | ICD-10-CM | POA: Diagnosis present

## 2019-01-06 DIAGNOSIS — E875 Hyperkalemia: Secondary | ICD-10-CM | POA: Diagnosis not present

## 2019-01-06 DIAGNOSIS — R1013 Epigastric pain: Secondary | ICD-10-CM | POA: Diagnosis present

## 2019-01-06 DIAGNOSIS — Z7901 Long term (current) use of anticoagulants: Secondary | ICD-10-CM

## 2019-01-06 DIAGNOSIS — N182 Chronic kidney disease, stage 2 (mild): Secondary | ICD-10-CM

## 2019-01-06 DIAGNOSIS — Z95810 Presence of automatic (implantable) cardiac defibrillator: Secondary | ICD-10-CM

## 2019-01-06 DIAGNOSIS — R079 Chest pain, unspecified: Secondary | ICD-10-CM | POA: Diagnosis not present

## 2019-01-06 DIAGNOSIS — R112 Nausea with vomiting, unspecified: Secondary | ICD-10-CM | POA: Diagnosis present

## 2019-01-06 DIAGNOSIS — I340 Nonrheumatic mitral (valve) insufficiency: Secondary | ICD-10-CM | POA: Diagnosis present

## 2019-01-06 DIAGNOSIS — Z4682 Encounter for fitting and adjustment of non-vascular catheter: Secondary | ICD-10-CM | POA: Diagnosis not present

## 2019-01-06 DIAGNOSIS — J189 Pneumonia, unspecified organism: Secondary | ICD-10-CM | POA: Diagnosis not present

## 2019-01-06 DIAGNOSIS — N179 Acute kidney failure, unspecified: Secondary | ICD-10-CM | POA: Diagnosis not present

## 2019-01-06 DIAGNOSIS — A419 Sepsis, unspecified organism: Secondary | ICD-10-CM | POA: Diagnosis not present

## 2019-01-06 DIAGNOSIS — Z6827 Body mass index (BMI) 27.0-27.9, adult: Secondary | ICD-10-CM

## 2019-01-06 HISTORY — PX: TEMPORARY DIALYSIS CATHETER: CATH118312

## 2019-01-06 HISTORY — PX: IABP INSERTION: CATH118242

## 2019-01-06 HISTORY — PX: RIGHT HEART CATH: CATH118263

## 2019-01-06 LAB — POCT I-STAT 7, (LYTES, BLD GAS, ICA,H+H)
Acid-base deficit: 9 mmol/L — ABNORMAL HIGH (ref 0.0–2.0)
Acid-base deficit: 14 mmol/L — ABNORMAL HIGH (ref 0.0–2.0)
Acid-base deficit: 10 mmol/L — ABNORMAL HIGH (ref 0.0–2.0)
Bicarbonate: 15.9 mmol/L — ABNORMAL LOW (ref 20.0–28.0)
Bicarbonate: 12.3 mmol/L — ABNORMAL LOW (ref 20.0–28.0)
Bicarbonate: 15 mmol/L — ABNORMAL LOW (ref 20.0–28.0)
Calcium, Ion: 0.93 mmol/L — ABNORMAL LOW (ref 1.15–1.40)
Calcium, Ion: 0.81 mmol/L — CL (ref 1.15–1.40)
Calcium, Ion: 0.87 mmol/L — CL (ref 1.15–1.40)
HCT: 35 % — ABNORMAL LOW (ref 39.0–52.0)
HCT: 35 % — ABNORMAL LOW (ref 39.0–52.0)
HCT: 34 % — ABNORMAL LOW (ref 39.0–52.0)
Hemoglobin: 11.9 g/dL — ABNORMAL LOW (ref 13.0–17.0)
Hemoglobin: 11.9 g/dL — ABNORMAL LOW (ref 13.0–17.0)
Hemoglobin: 11.6 g/dL — ABNORMAL LOW (ref 13.0–17.0)
O2 Saturation: 99 %
O2 Saturation: 96 %
O2 Saturation: 95 %
Patient temperature: 97.8
Potassium: 5.8 mmol/L — ABNORMAL HIGH (ref 3.5–5.1)
Potassium: 5.2 mmol/L — ABNORMAL HIGH (ref 3.5–5.1)
Potassium: 5.6 mmol/L — ABNORMAL HIGH (ref 3.5–5.1)
Sodium: 133 mmol/L — ABNORMAL LOW (ref 135–145)
Sodium: 136 mmol/L (ref 135–145)
Sodium: 134 mmol/L — ABNORMAL LOW (ref 135–145)
TCO2: 17 mmol/L — ABNORMAL LOW (ref 22–32)
TCO2: 13 mmol/L — ABNORMAL LOW (ref 22–32)
TCO2: 16 mmol/L — ABNORMAL LOW (ref 22–32)
pCO2 arterial: 30.7 mmHg — ABNORMAL LOW (ref 32.0–48.0)
pCO2 arterial: 29 mmHg — ABNORMAL LOW (ref 32.0–48.0)
pCO2 arterial: 29.9 mmHg — ABNORMAL LOW (ref 32.0–48.0)
pH, Arterial: 7.316 — ABNORMAL LOW (ref 7.350–7.450)
pH, Arterial: 7.233 — ABNORMAL LOW (ref 7.350–7.450)
pH, Arterial: 7.307 — ABNORMAL LOW (ref 7.350–7.450)
pO2, Arterial: 136 mmHg — ABNORMAL HIGH (ref 83.0–108.0)
pO2, Arterial: 91 mmHg (ref 83.0–108.0)
pO2, Arterial: 77 mmHg — ABNORMAL LOW (ref 83.0–108.0)

## 2019-01-06 LAB — CBC
HCT: 34.9 % — ABNORMAL LOW (ref 39.0–52.0)
Hemoglobin: 10.7 g/dL — ABNORMAL LOW (ref 13.0–17.0)
MCH: 30.7 pg (ref 26.0–34.0)
MCHC: 30.7 g/dL (ref 30.0–36.0)
MCV: 100.3 fL — ABNORMAL HIGH (ref 80.0–100.0)
Platelets: 224 10*3/uL (ref 150–400)
RBC: 3.48 MIL/uL — ABNORMAL LOW (ref 4.22–5.81)
RDW: 15.1 % (ref 11.5–15.5)
WBC: 20.1 10*3/uL — ABNORMAL HIGH (ref 4.0–10.5)
nRBC: 0.4 % — ABNORMAL HIGH (ref 0.0–0.2)

## 2019-01-06 LAB — TYPE AND SCREEN
ABO/RH(D): A POS
Antibody Screen: NEGATIVE

## 2019-01-06 LAB — COMPREHENSIVE METABOLIC PANEL
ALT: 1382 U/L — ABNORMAL HIGH (ref 0–44)
AST: 1252 U/L — ABNORMAL HIGH (ref 15–41)
Albumin: 2.5 g/dL — ABNORMAL LOW (ref 3.5–5.0)
Alkaline Phosphatase: 49 U/L (ref 38–126)
Anion gap: 20 — ABNORMAL HIGH (ref 5–15)
BUN: 30 mg/dL — ABNORMAL HIGH (ref 6–20)
CO2: 21 mmol/L — ABNORMAL LOW (ref 22–32)
Calcium: 7.2 mg/dL — ABNORMAL LOW (ref 8.9–10.3)
Chloride: 95 mmol/L — ABNORMAL LOW (ref 98–111)
Creatinine, Ser: 3.22 mg/dL — ABNORMAL HIGH (ref 0.61–1.24)
GFR calc Af Amer: 24 mL/min — ABNORMAL LOW (ref >60)
GFR calc non Af Amer: 21 mL/min — ABNORMAL LOW (ref >60)
GLUCOSE: 220 mg/dL — AB (ref 70–99)
Potassium: 5.6 mmol/L — ABNORMAL HIGH (ref 3.5–5.1)
Sodium: 136 mmol/L (ref 135–145)
Total Bilirubin: 5.3 mg/dL — ABNORMAL HIGH (ref 0.3–1.2)
Total Protein: 4.5 g/dL — ABNORMAL LOW (ref 6.5–8.1)

## 2019-01-06 LAB — COOXEMETRY PANEL
Carboxyhemoglobin: 0.7 % (ref 0.5–1.5)
Carboxyhemoglobin: 0.8 % (ref 0.5–1.5)
Methemoglobin: 1.6 % — ABNORMAL HIGH (ref 0.0–1.5)
Methemoglobin: 1.6 % — ABNORMAL HIGH (ref 0.0–1.5)
O2 Saturation: 47.8 %
O2 Saturation: 52.3 %
Total hemoglobin: 11.2 g/dL — ABNORMAL LOW (ref 12.0–16.0)
Total hemoglobin: 11 g/dL — ABNORMAL LOW (ref 12.0–16.0)

## 2019-01-06 LAB — RENAL FUNCTION PANEL
Albumin: 2.8 g/dL — ABNORMAL LOW (ref 3.5–5.0)
Anion gap: 21 — ABNORMAL HIGH (ref 5–15)
BUN: 34 mg/dL — ABNORMAL HIGH (ref 6–20)
CHLORIDE: 97 mmol/L — AB (ref 98–111)
CO2: 17 mmol/L — ABNORMAL LOW (ref 22–32)
Calcium: 7 mg/dL — ABNORMAL LOW (ref 8.9–10.3)
Creatinine, Ser: 3.82 mg/dL — ABNORMAL HIGH (ref 0.61–1.24)
GFR calc Af Amer: 20 mL/min — ABNORMAL LOW (ref >60)
GFR calc non Af Amer: 17 mL/min — ABNORMAL LOW (ref >60)
Glucose, Bld: 154 mg/dL — ABNORMAL HIGH (ref 70–99)
Phosphorus: 8.7 mg/dL — ABNORMAL HIGH (ref 2.5–4.6)
Potassium: 5.2 mmol/L — ABNORMAL HIGH (ref 3.5–5.1)
Sodium: 135 mmol/L (ref 135–145)

## 2019-01-06 LAB — BLOOD GAS, ARTERIAL
Acid-base deficit: 9 mmol/L — ABNORMAL HIGH (ref 0.0–2.0)
Bicarbonate: 15.9 mmol/L — ABNORMAL LOW (ref 20.0–28.0)
FIO2: 0.7
MECHVT: 600 mL
O2 Saturation: 97.7 %
PEEP: 5 cmH2O
Patient temperature: 98.6
RATE: 28 resp/min
pCO2 arterial: 31.5 mmHg — ABNORMAL LOW (ref 32.0–48.0)
pH, Arterial: 7.323 — ABNORMAL LOW (ref 7.350–7.450)
pO2, Arterial: 118 mmHg — ABNORMAL HIGH (ref 83.0–108.0)

## 2019-01-06 LAB — PROTIME-INR
INR: 5.8 (ref 0.8–1.2)
Prothrombin Time: 51.1 seconds — ABNORMAL HIGH (ref 11.4–15.2)

## 2019-01-06 LAB — MRSA PCR SCREENING: MRSA by PCR: NEGATIVE

## 2019-01-06 LAB — GLUCOSE, CAPILLARY
GLUCOSE-CAPILLARY: 119 mg/dL — AB (ref 70–99)
Glucose-Capillary: 86 mg/dL (ref 70–99)

## 2019-01-06 LAB — LACTIC ACID, PLASMA
Lactic Acid, Venous: >11 mmol/L (ref 0.5–1.9)
Lactic Acid, Venous: 9.6 mmol/L (ref 0.5–1.9)

## 2019-01-06 LAB — HEPARIN LEVEL (UNFRACTIONATED): Heparin Unfractionated: 1.09 IU/mL — ABNORMAL HIGH (ref 0.30–0.70)

## 2019-01-06 LAB — PROCALCITONIN: Procalcitonin: 4.76 ng/mL

## 2019-01-06 LAB — TSH: TSH: 7.319 u[IU]/mL — ABNORMAL HIGH (ref 0.350–4.500)

## 2019-01-06 LAB — MAGNESIUM: Magnesium: 1.6 mg/dL — ABNORMAL LOW (ref 1.7–2.4)

## 2019-01-06 LAB — PHOSPHORUS: Phosphorus: 8.9 mg/dL — ABNORMAL HIGH (ref 2.5–4.6)

## 2019-01-06 LAB — ABO/RH: ABO/RH(D): A POS

## 2019-01-06 LAB — BRAIN NATRIURETIC PEPTIDE: B Natriuretic Peptide: 1393.5 pg/mL — ABNORMAL HIGH (ref 0.0–100.0)

## 2019-01-06 SURGERY — RIGHT HEART CATH
Anesthesia: LOCAL

## 2019-01-06 MED ORDER — NOREPINEPHRINE 16 MG/250ML-% IV SOLN
0.0000 ug/min | INTRAVENOUS | Status: DC
  Administered 2019-01-06: 56 ug/min via INTRAVENOUS
  Filled 2019-01-06 (×2): qty 250

## 2019-01-06 MED ORDER — ORAL CARE MOUTH RINSE
15.0000 mL | OROMUCOSAL | Status: DC
  Administered 2019-01-06: 15 mL via OROMUCOSAL

## 2019-01-06 MED ORDER — SODIUM BICARBONATE 8.4 % IV SOLN
INTRAVENOUS | Status: DC
  Administered 2019-01-06: 10:00:00 via INTRAVENOUS
  Filled 2019-01-06: qty 150

## 2019-01-06 MED ORDER — HEPARIN SODIUM (PORCINE) 1000 UNIT/ML IJ SOLN
INTRAMUSCULAR | Status: DC | PRN
  Administered 2019-01-06: 1000 [IU] via INTRAVENOUS

## 2019-01-06 MED ORDER — SODIUM CHLORIDE 0.9% FLUSH: 10.0000 mL | Freq: Two times a day (BID) | INTRAVENOUS | Status: DC

## 2019-01-06 MED ORDER — SODIUM CHLORIDE 0.9 % IV SOLN
INTRAVENOUS | Status: AC | PRN
  Administered 2019-01-06: 10 mL/h via INTRAVENOUS

## 2019-01-06 MED ORDER — EPINEPHRINE PF 1 MG/ML IJ SOLN
0.5000 ug/min | INTRAVENOUS | Status: DC
  Administered 2019-01-06: 15 ug/min via INTRAVENOUS
  Administered 2019-01-06 (×2): 14 ug/min via INTRAVENOUS
  Filled 2019-01-06 (×4): qty 4

## 2019-01-06 MED ORDER — SODIUM CHLORIDE 0.9 % IV SOLN
250.0000 mL | INTRAVENOUS | Status: DC | PRN
  Administered 2019-01-06: 250 mL via INTRAVENOUS

## 2019-01-06 MED ORDER — LEVETIRACETAM IN NACL 500 MG/100ML IV SOLN
500.0000 mg | Freq: Two times a day (BID) | INTRAVENOUS | Status: DC
  Administered 2019-01-06: 500 mg via INTRAVENOUS
  Filled 2019-01-06: qty 100

## 2019-01-06 MED ORDER — HEPARIN SODIUM (PORCINE) 1000 UNIT/ML IJ SOLN
INTRAMUSCULAR | Status: AC
  Filled 2019-01-06: qty 1

## 2019-01-06 MED ORDER — CHLORHEXIDINE GLUCONATE 0.12% ORAL RINSE (MEDLINE KIT)
15.0000 mL | Freq: Two times a day (BID) | OROMUCOSAL | Status: DC
  Administered 2019-01-06: 15 mL via OROMUCOSAL

## 2019-01-06 MED ORDER — LIDOCAINE HCL (PF) 1 % IJ SOLN
INTRAMUSCULAR | Status: AC
  Filled 2019-01-06: qty 30

## 2019-01-06 MED ORDER — SODIUM BICARBONATE 8.4 % IV SOLN
INTRAVENOUS | Status: AC
  Administered 2019-01-06: 100 meq
  Filled 2019-01-06: qty 100

## 2019-01-06 MED ORDER — SODIUM BICARBONATE 8.4 % IV SOLN
INTRAVENOUS | Status: AC
  Filled 2019-01-06: qty 50

## 2019-01-06 MED ORDER — ORAL CARE MOUTH RINSE: 15.0000 mL | OROMUCOSAL | Status: DC

## 2019-01-06 MED ORDER — HEPARIN (PORCINE) 25000 UT/250ML-% IV SOLN
1050.0000 [IU]/h | INTRAVENOUS | Status: DC
  Administered 2019-01-06: 1050 [IU]/h via INTRAVENOUS
  Filled 2019-01-06: qty 250

## 2019-01-06 MED ORDER — PIPERACILLIN-TAZOBACTAM 3.375 G IVPB 30 MIN
3.3750 g | Freq: Four times a day (QID) | INTRAVENOUS | Status: DC
  Administered 2019-01-06 (×3): 3.375 g via INTRAVENOUS
  Filled 2019-01-06 (×5): qty 50

## 2019-01-06 MED ORDER — PIPERACILLIN-TAZOBACTAM 3.375 G IVPB
3.3750 g | Freq: Three times a day (TID) | INTRAVENOUS | Status: DC
  Filled 2019-01-06: qty 50

## 2019-01-06 MED ORDER — FENTANYL BOLUS VIA INFUSION
50.0000 ug | INTRAVENOUS | Status: DC | PRN
  Filled 2019-01-06: qty 50

## 2019-01-06 MED ORDER — MIDAZOLAM BOLUS VIA INFUSION
5.0000 mg | Freq: Once | INTRAVENOUS | Status: AC
  Administered 2019-01-07: 5 mg via INTRAVENOUS
  Filled 2019-01-06: qty 5

## 2019-01-06 MED ORDER — HEPARIN (PORCINE) IN NACL 1000-0.9 UT/500ML-% IV SOLN
INTRAVENOUS | Status: AC
  Filled 2019-01-06: qty 500

## 2019-01-06 MED ORDER — SODIUM CHLORIDE 0.9% FLUSH: 3.0000 mL | Freq: Two times a day (BID) | INTRAVENOUS | Status: DC

## 2019-01-06 MED ORDER — MIDAZOLAM HCL 2 MG/2ML IJ SOLN: 2.0000 mg | INTRAMUSCULAR | Status: DC | PRN

## 2019-01-06 MED ORDER — ACETAMINOPHEN 325 MG PO TABS: 650.0000 mg | ORAL_TABLET | ORAL | Status: DC | PRN

## 2019-01-06 MED ORDER — HEPARIN (PORCINE) 2000 UNITS/L FOR CRRT
INTRAVENOUS_CENTRAL | Status: DC | PRN
  Filled 2019-01-06: qty 1000

## 2019-01-06 MED ORDER — HEPARIN (PORCINE) IN NACL 1000-0.9 UT/500ML-% IV SOLN
INTRAVENOUS | Status: DC | PRN
  Administered 2019-01-06 (×2): 500 mL

## 2019-01-06 MED ORDER — SODIUM BICARBONATE 8.4 % IV SOLN
INTRAVENOUS | Status: DC | PRN
  Administered 2019-01-06: 100 meq via INTRAVENOUS

## 2019-01-06 MED ORDER — SODIUM CHLORIDE 0.9% FLUSH: 3.0000 mL | INTRAVENOUS | Status: DC | PRN

## 2019-01-06 MED ORDER — AMIODARONE HCL IN DEXTROSE 360-4.14 MG/200ML-% IV SOLN
60.0000 mg/h | INTRAVENOUS | Status: AC
  Administered 2019-01-06: 60 mg/h via INTRAVENOUS
  Filled 2019-01-06: qty 200

## 2019-01-06 MED ORDER — FENTANYL CITRATE (PF) 100 MCG/2ML IJ SOLN: 50.0000 ug | Freq: Once | INTRAMUSCULAR | Status: DC

## 2019-01-06 MED ORDER — LIDOCAINE HCL (PF) 1 % IJ SOLN
INTRAMUSCULAR | Status: DC | PRN
  Administered 2019-01-06: 10 mL
  Administered 2019-01-06: 15 mL

## 2019-01-06 MED ORDER — SODIUM CHLORIDE 0.9% IV SOLUTION: Freq: Once | INTRAVENOUS | Status: DC

## 2019-01-06 MED ORDER — AMIODARONE HCL IN DEXTROSE 360-4.14 MG/200ML-% IV SOLN
30.0000 mg/h | INTRAVENOUS | Status: DC
  Filled 2019-01-06: qty 200

## 2019-01-06 MED ORDER — SODIUM CHLORIDE 0.9% FLUSH: 10.0000 mL | INTRAVENOUS | Status: DC | PRN

## 2019-01-06 MED ORDER — MIDAZOLAM 50MG/50ML (1MG/ML) PREMIX INFUSION
5.0000 mg/h | INTRAVENOUS | Status: DC
  Administered 2019-01-06: 5 mg/h via INTRAVENOUS
  Filled 2019-01-06: qty 50

## 2019-01-06 MED ORDER — CHLORHEXIDINE GLUCONATE 0.12% ORAL RINSE (MEDLINE KIT): 15.0000 mL | Freq: Two times a day (BID) | OROMUCOSAL | Status: DC

## 2019-01-06 MED ORDER — MILRINONE LACTATE IN DEXTROSE 20-5 MG/100ML-% IV SOLN
0.1250 ug/kg/min | INTRAVENOUS | Status: DC
  Administered 2019-01-06: 0.125 ug/kg/min via INTRAVENOUS

## 2019-01-06 MED ORDER — FENTANYL 2500MCG IN NS 250ML (10MCG/ML) PREMIX INFUSION
25.0000 ug/h | INTRAVENOUS | Status: DC
  Administered 2019-01-06: 25 ug/h via INTRAVENOUS
  Filled 2019-01-06 (×2): qty 250

## 2019-01-06 MED ORDER — PRISMASOL BGK 4/2.5 32-4-2.5 MEQ/L REPLACEMENT SOLN
Status: DC
  Administered 2019-01-06: 18:00:00 via INTRAVENOUS_CENTRAL
  Filled 2019-01-06 (×2): qty 5000

## 2019-01-06 MED ORDER — ONDANSETRON HCL 4 MG/2ML IJ SOLN: 4.0000 mg | Freq: Four times a day (QID) | INTRAMUSCULAR | Status: DC | PRN

## 2019-01-06 MED ORDER — PRISMASOL BGK 0/2.5 32-2.5 MEQ/L REPLACEMENT SOLN
Status: DC
  Administered 2019-01-06 (×2): via INTRAVENOUS_CENTRAL
  Filled 2019-01-06 (×5): qty 5000

## 2019-01-06 MED ORDER — FENTANYL BOLUS VIA INFUSION
300.0000 ug | Freq: Once | INTRAVENOUS | Status: AC
  Administered 2019-01-07: 300 ug via INTRAVENOUS
  Filled 2019-01-06: qty 300

## 2019-01-06 MED ORDER — PANTOPRAZOLE SODIUM 40 MG IV SOLR
40.0000 mg | Freq: Every day | INTRAVENOUS | Status: DC
  Administered 2019-01-06: 40 mg via INTRAVENOUS
  Filled 2019-01-06: qty 40

## 2019-01-06 MED ORDER — PRISMASOL BGK 4/2.5 32-4-2.5 MEQ/L IV SOLN
INTRAVENOUS | Status: DC
  Administered 2019-01-06 (×2): via INTRAVENOUS_CENTRAL
  Filled 2019-01-06 (×5): qty 5000

## 2019-01-06 MED ORDER — HEPARIN SODIUM (PORCINE) 5000 UNIT/ML IJ SOLN: 5000.0000 [IU] | Freq: Three times a day (TID) | INTRAMUSCULAR | Status: DC

## 2019-01-06 MED ORDER — INSULIN ASPART 100 UNIT/ML ~~LOC~~ SOLN: 2.0000 [IU] | SUBCUTANEOUS | Status: DC

## 2019-01-06 MED ORDER — AMIODARONE LOAD VIA INFUSION
150.0000 mg | Freq: Once | INTRAVENOUS | Status: DC
  Filled 2019-01-06: qty 83.34

## 2019-01-06 MED ORDER — CHLORHEXIDINE GLUCONATE CLOTH 2 % EX PADS: 6.0000 | MEDICATED_PAD | Freq: Every day | CUTANEOUS | Status: DC

## 2019-01-06 MED ORDER — VASOPRESSIN 20 UNIT/ML IV SOLN
0.0300 [IU]/min | INTRAVENOUS | Status: DC
  Administered 2019-01-06: 0.03 [IU]/min via INTRAVENOUS
  Filled 2019-01-06 (×2): qty 2

## 2019-01-06 MED ORDER — NOREPINEPHRINE 4 MG/250ML-% IV SOLN
0.0000 ug/min | INTRAVENOUS | Status: DC
  Administered 2019-01-06: 58 ug/min via INTRAVENOUS
  Filled 2019-01-06 (×2): qty 250

## 2019-01-06 MED ORDER — HEPARIN SODIUM (PORCINE) 1000 UNIT/ML DIALYSIS
1000.0000 [IU] | INTRAMUSCULAR | Status: DC | PRN
  Filled 2019-01-06: qty 6

## 2019-01-06 MED ORDER — MILRINONE LACTATE IN DEXTROSE 20-5 MG/100ML-% IV SOLN: 0.1250 ug/kg/min | INTRAVENOUS | Status: DC

## 2019-01-06 SURGICAL SUPPLY — 14 items
BALLN IABP SENSA PLUS 8F 50CC (BALLOONS) ×3
BALLOON IABP SENS PLUS 8F 50CC (BALLOONS) ×2 IMPLANT
CATH SWAN GANZ VIP 7.5F (CATHETERS) ×3 IMPLANT
DEVICE CONTINUOUS FLUSH (MISCELLANEOUS) ×3 IMPLANT
HOVERMATT SINGLE USE (MISCELLANEOUS) ×3 IMPLANT
KIT HEART LEFT (KITS) ×3 IMPLANT
PACK CARDIAC CATHETERIZATION (CUSTOM PROCEDURE TRAY) ×3 IMPLANT
SHEATH PINNACLE 6F 10CM (SHEATH) ×3 IMPLANT
SHEATH PINNACLE 8F 10CM (SHEATH) ×3 IMPLANT
SHEATH PROBE COVER 6X72 (BAG) ×6 IMPLANT
SLEEVE REPOSITIONING LENGTH 30 (MISCELLANEOUS) ×6 IMPLANT
TRANSDUCER W/MONITORING KIT (MISCELLANEOUS) ×9 IMPLANT
TRANSDUCER W/STOPCOCK (MISCELLANEOUS) ×3 IMPLANT
WIRE EMERALD 3MM-J .035X150CM (WIRE) ×3 IMPLANT

## 2019-01-06 NOTE — Progress Notes (Signed)
  [facility] TEAM NOTE   Patient with persistent hypotension despite multiple pressors including EPI at 14 and NE 42.  CVP 29.   Labs back with INR 5.1 and Cr 3.22 and markedly elevated transaminases.   ICD interrogation shows AFL for > 1 month. I tired to pace him out but failed.   Given MSOF and ongoing hypotension. Only option to try and save him is to proceed with mechanical support and possible CRRT.   Will give 2u FFP and take to cath lab for IABP, swan and HD cath placement.   D/w his sister who is aware that he is likely not to survive this hospitalization.   Of note, multiple male family members have died from NICM. Family need genetic w/u.  Additional CCT 45 mins  [name], MD  12:17 PM

## 2019-01-06 NOTE — Consult Note (Signed)
Advanced Heart Failure Team Consult Note   Referring: Byrum (CCM)  Primary Cardiologist:  Branch Primary EP: Allred  Reason for Admission: Cardiac arrest    HPI:    [name] is a 53 year old obese man with a history of a nonischemic cardiomyopathy with VT (clean coronaries 2011, on sotalol) LVEF 25-30% 09/2017 s/p St.Jude AICD.  He has paroxysmal atrial flutter on anticoagulation, hyperlipidemia, untreated OSA, chronic renal insufficiency.  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 Delton Coombes for cardiac arrest and cardiogenic shock.  According to notes in the chart, he has been having more trouble over the last month with palpitations and dyspnea. He was seen in the emergency department at [facility] 2/27 with nausea and vomiting without cough, fever, diarrhea, abdominal pain.  Received Zofran, PPI.  Seen at [facility] for similar complaints, nausea and emesis.  Admitted for IV fluids but developed progressive hypotension.  Norepinephrine was initiated and volume was given but he progressed to respiratory failure and cardiac arrest (initial rhythm unclear).  He underwent CPR for 6 minutes.  A chest x-ray post intubation was reported consistent with progressive pulmonary edema, ET tube in good position.    ABG post code showed profound mixed acidosis, pH 6.88.  NT pro BNP 21578.  He did have a leukocytosis 16.5 (76% neutrophils), UA 2/29 negative.  He received vancomycin, Zosyn x1 at the outside hospital.  Arrived to [facility] on vent with Epi 10, NE 42 and Vasopressin at 0.03 with SBP 60s. Epi increased to 15. Given 2 amps bicarb. F/u ABG here  7.31/31/136/99%  I placed left radial arterial line and performed bedside echo.   LVEF 15% RV moderate to severely decreased. Mild to moderate MR  ECG with AFL in 80s.    Review of Systems: As per HPI. Otherwise unavailable due to intubated and sedated.    Home Medications Prior to  Admission medications   Medication Sig Start Date End Date Taking? Authorizing Provider  allopurinol (ZYLOPRIM) 300 MG tablet Take 300 mg by mouth every morning.  06/03/16   [provider]  atorvastatin (LIPITOR) 80 MG tablet Take 80 mg by mouth at bedtime. 01/20/16   [provider]  carvedilol (COREG) 12.5 MG tablet Take 12.5 mg by mouth 2 (two) times daily with a meal.    [provider]  ELIQUIS 5 MG TABS tablet Take 5 mg by mouth 2 (two) times daily. 01/20/16   [provider]  furosemide (LASIX) 40 MG tablet TAKE 1 AND 1/2 TABLETS BY MOUTH IN THE MORNING & TAKE ONE TABLET BY MOUTH IN THE EVENING Patient taking differently: Take 40-60 mg by mouth See admin instructions. TAKE 1 AND 1/2 TABLETS BY MOUTH IN THE MORNING & TAKE ONE TABLET BY MOUTH IN THE EVENING 08/02/18   Antoine Poche, MD  irbesartan (AVAPRO) 75 MG tablet TAKE ONE TABLET BY MOUTH DAILY. STOP ENTRESTO. Patient taking differently: Take 75 mg by mouth every evening.  08/01/18   Antoine Poche, MD  levETIRAcetam (KEPPRA) 500 MG tablet TAKE ONE TABLET BY MOUTH TWICE DAILY Patient taking differently: Take 500 mg by mouth 2 (two) times daily.  06/05/18   Drema Dallas, DO  ondansetron (ZOFRAN ODT) 4 MG disintegrating tablet 4mg  ODT q4 hours prn nausea/vomit 01/03/19   Bethann Berkshire, MD  pantoprazole (PROTONIX) 20 MG tablet Take 1 tablet (20 mg total) by mouth daily. 01/03/19   Bethann Berkshire,  MD  potassium chloride (K-DUR,KLOR-CON) 10 MEQ tablet Take 10 mEq by mouth 2 (two) times daily.  09/03/18   [provider]  SOTALOL AF 120 MG TABS TAKE ONE TABLET BY MOUTH EVERY TWELVE HOURS Patient taking differently: Take 120 mg by mouth every 12 (twelve) hours.  07/02/18   Hillis Range, MD    Past Medical History: Past Medical History:  Diagnosis Date  . A-fib (HCC)   . Chronic systolic heart failure (HCC)   . CVA (cerebral infarction)   . Disability examination    Discussion of disability  September, 2013  . Drug therapy    Sotalol started for VT storm  July, 2013  . Ejection fraction < 50%    Echo 06/05/12: Mild LVH, EF 20%, posterior severe HK, anterolateral severe HK, anterior severe HK, mid to apical septal AK, AK of the true apex, apical inferior HK, restrictive physiology, trivial MR, moderate LAE, mildly reduced RV function, mild TR.  Marland Kitchen Headache(784.0)    Patient seen to have headaches from spironolactone.  Drug was stopped, August, 2012  . Hypokalemia    There was some decrease in potassium with his admission in July, 2013. We will watch this very carefully.  . ICD (implantable cardiac defibrillator) battery depletion 11/11/10   St. Jude, Dr.Allred  . 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 Mitral regurgitation   . NICM (nonischemic cardiomyopathy) (HCC)    Nonischemic / catheterization October, 2011, normal coronary arteries  . Renal insufficiency    Creatinine 1.5 (GFR greater than 50)  . Sleepiness    has some sleepiness during the day  . VT (ventricular tachycardia) (HCC)    VT storm 7/13 - Sotalol started    Past Surgical History: Past Surgical History:  Procedure Laterality Date  . CARDIAC DEFIBRILLATOR PLACEMENT  11/11/10   SJM Fortify DR ICD implanted by Dr Johney Frame  . COLONOSCOPY N/A 07/13/2016   Procedure: COLONOSCOPY;  Surgeon: Corbin Ade, MD;  Location: [facility];  Service: Endoscopy;  Laterality: N/A;  12:00 pm - pt can't move up due to transportation  . TEE WITH CARDIOVERSION  11/17/08   No LV clot    Family History: Family History  Problem Relation Age of Onset  . Heart attack Father 65       Multiple MIs  . Stroke Father   . Heart attack Brother   . Heart failure Brother        CHF  . Hypertension Mother   . Stroke Mother   . Heart attack Paternal Grandfather   . Colon cancer Neg Hx     Social History: Social History   Socioeconomic History  . Marital status: Single    Spouse name: Not on file  . Number of  children: 5  . Years of education: Not on file  . Highest education level: Not on file  Occupational History  . Occupation: disabled  Social Needs  . Financial resource strain: Not on file  . Food insecurity:    Worry: Not on file    Inability: Not on file  . Transportation needs:    Medical: Not on file    Non-medical: Not on file  Tobacco Use  . Smoking status: Never Smoker  . Smokeless tobacco: Never Used  Substance and Sexual Activity  . Alcohol use: Yes    Alcohol/week: 0.0 standard drinks    Comment: OCCASIONAL  (NOT HEAVY)  . Drug use: No  . Sexual activity: Not on file  Lifestyle  . Physical activity:    Days per week: Not on file    Minutes per session: Not on file  . Stress: Not on file  Relationships  . Social connections:    Talks on phone: Not on file    Gets together: Not on file    Attends religious service: Not on file    Active member of club or organization: Not on file    Attends meetings of clubs or organizations: Not on file    Relationship status: Not on file  Other Topics Concern  . Not on file  Social History Narrative   Lives in [place], [place] with fiance.   No regular exercise.    Allergies:  No Known Allergies  Objective:    Vital Signs:   Pulse Rate:  [109] 109 (03/01 0850) Resp:  [31] 31 (03/01 0850) SpO2:  [95 %] 95 % (03/01 0850) FiO2 (%):  [100 %] 100 % (03/01 0850)    Physical Exam: General:  Obese male intubated and sedated on vent.  Seems to have some purposeful movements HEENT: normal x ETT Neck: thick supple. Unable to see JVP due to size . Carotids 2+ bilat; no bruits. No lymphadenopathy or thryomegaly appreciated. Cor: PMI laterally displaced. Irregular rate & rhythm. + s3 Lungs: coarse anteriorly  Abdomen: obese soft, nontender, nondistended. No hepatosplenomegaly. No bruits or masses. Good bowel sounds. Extremities: cool no cyanosis, clubbing, rash, edema Neuro: intubated sedates  Telemetry: AFL 80s. Personally  reviewed   Labs: Basic Metabolic Panel: Recent Labs  Lab 01/03/19 2030 01/22/2019 0950  NA 137 133*  K 4.7 5.8*  CL 103  --   CO2 25  --   GLUCOSE 113*  --   BUN 18  --   CREATININE 1.64*  --   CALCIUM 8.7*  --     Liver Function Tests: Recent Labs  Lab 01/03/19 2030  AST 19  ALT 17  ALKPHOS 44  BILITOT 3.5*  PROT 7.4  ALBUMIN 4.2   Recent Labs  Lab 01/03/19 2030  LIPASE 40   No results for input(s): AMMONIA in the last 168 hours.  CBC: Recent Labs  Lab 01/03/19 2030 02/05/2019 0950  WBC 7.1  --   HGB 12.7* 11.9*  HCT 40.0 35.0*  MCV 95.9  --   PLT 257  --     Cardiac Enzymes: No results for input(s): CKTOTAL, CKMB, CKMBINDEX, TROPONINI in the last 168 hours.  BNP: BNP (last 3 results) No results for input(s): BNP in the last 8760 hours.  ProBNP (last 3 results) No results for input(s): PROBNP in the last 8760 hours.   CBG: No results for input(s): GLUCAP in the last 168 hours.  Coagulation Studies: No results for input(s): LABPROT, INR in the last 72 hours.  Other results: EKG: as per HPI  Imaging:  No results found.      Assessment:   [name] is a 53 year old obese man with a history of a nonischemic cardiomyopathy with VT (clean coronaries 2011, on sotalol) LVEF 25-30% 09/2017 s/p St.Jude ICD.  He has paroxysmal atrial flutter on anticoagulation, hyperlipidemia, untreated OSA, chronic renal insufficiency.  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 Delton Coombes on 01/15/2019 for cardiac arrest and cardiogenic shock.  Plan/Discussion:    1. Cardiac arrest - As no mention of VT or ICD shock, I suspect PEA arrest in setting of cardiogenic shock and respiratory failure. Will  interrogate ICD - ROSC 6 mins. Will not cool due to severe hemodynamic instability.   2. Acute on chronic systolic HF with cardiogenic shock - NICM CM by cath 2011 with normal cors. - LVEF 25-30% 09/2017 - Likely  triggerd by recurrent AFL - Bedside echo with LVEF 15-20% and mild to moderate MR - Support with NE, EPI. Add milrinone. Wean VP as tolerated.  - Check CVP and co-ox - Holding chronic HF meds due to shock - May need swan and IABP for support - Given co-morbidities, social issues and RV failure not a clear candidate for advanced therapies at this point   3. Acute hypoxic respiratory failure - Due to #1 and #2 - CCM managing vent. - Will need diuresis   4. Paroxysmal AFL - Device report in chart shows persistent AF since 12/03/18. Likely trigger for ADHF.  - has been on apixaban per report. Will switch to heparin. Will start amiodarone. Will likely need TEE & DC-CV  5. Acute on CKD III - baseline Cr 1.3-1.6 - Creatinine 1.6 today. Suspect may worsen with shock. K 5.8 I=on iSTAT. Will follow.   6. ID - WBC 20K. Suspect due to shock - Covering with zosyn for possible aspiration   7. H/o CVA - baseline function status currently unclear.   8. OSA - untreated. Will need to address as outpatient.  CRITICAL CARE Performed by: [name]  Total critical care time: 60 minutes  Critical care time was exclusive of separately billable procedures and treating other patients.  Critical care was necessary to treat or prevent imminent or life-threatening deterioration.  Critical care was time spent personally by me (independent of midlevel providers or residents) on the following activities: development of treatment plan with patient and/or surrogate as well as nursing, discussions with consultants, evaluation of patient's response to treatment, examination of patient, obtaining history from patient or surrogate, ordering and performing treatments and interventions, ordering and review of laboratory studies, ordering and review of radiographic studies, pulse oximetry and re-evaluation of patient's condition.    Length of Stay: 0   [name] MD 2019/02/03, 10:24 AM  Advanced  Heart Failure Team Pager [phone] (M-F; 7a - 4p)  Please contact [facility] for night-coverage after hours (4p -7a ) and weekends on [email or web address]

## 2019-01-06 NOTE — Progress Notes (Signed)
ANTICOAGULATION CONSULT NOTE - Initial Consult  Pharmacy Consult for IV heparin Indication: atrial fibrillation, IABP  No Known Allergies  Patient Measurements:   Heparin Dosing Weight: 90.7 kg  Vital Signs: BP: 113/59 (03/01 1328) Pulse Rate: 65 (03/01 1328)  Labs: Recent Labs    01/03/19 2030 01/17/2019 0940 01/13/2019 0950  HGB 12.7* 10.7* 11.9*  HCT 40.0 34.9* 35.0*  PLT 257 224  --   LABPROT  --  51.1*  --   INR  --  5.8*  --   CREATININE 1.64* 3.22*  --     Estimated Creatinine Clearance: 30.6 mL/min (A) (by C-G formula based on SCr of 3.22 mg/dL (H)).   Medical History: Past Medical History:  Diagnosis Date  . A-fib (HCC)   . Chronic systolic heart failure (HCC)   . CVA (cerebral infarction)   . Disability examination    Discussion of disability September, 2013  . Drug therapy    Sotalol started for VT storm  July, 2013  . Ejection fraction < 50%    Echo 06/05/12: Mild LVH, EF 20%, posterior severe HK, anterolateral severe HK, anterior severe HK, mid to apical septal AK, AK of the true apex, apical inferior HK, restrictive physiology, trivial MR, moderate LAE, mildly reduced RV function, mild TR.  Marland Kitchen Headache(784.0)    Patient seen to have headaches from spironolactone.  Drug was stopped, August, 2012  . Hypokalemia    There was some decrease in potassium with his admission in July, 2013. We will watch this very carefully.  . ICD (implantable cardiac defibrillator) battery depletion 11/11/10   St. Jude, Dr.Allred  . 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 Mitral regurgitation   . NICM (nonischemic cardiomyopathy) (HCC)    Nonischemic / catheterization October, 2011, normal coronary arteries  . Renal insufficiency    Creatinine 1.5 (GFR greater than 50)  . Sleepiness    has some sleepiness during the day  . VT (ventricular tachycardia) (HCC)    VT storm 7/13 - Sotalol started    Medications:  Scheduled:  . sodium chloride   Intravenous Once   . amiodarone  150 mg Intravenous Once  . fentaNYL (SUBLIMAZE) injection  50 mcg Intravenous Once  . insulin aspart  2-6 Units Subcutaneous Q4H  . pantoprazole (PROTONIX) IV  40 mg Intravenous QHS  . sodium chloride flush  3 mL Intravenous Q12H   Infusions:  .  prismasol BGK 4/2.5    . sodium chloride    . amiodarone 60 mg/hr (02/05/2019 1140)   Followed by  . amiodarone    . epinephrine 14 mcg/min (January 07, 2019 0945)  . fentaNYL infusion INTRAVENOUS 25 mcg/hr (January 07, 2019 1024)  . levETIRAcetam    . milrinone    . norepinephrine (LEVOPHED) Adult infusion 56 mcg/min (02/01/2019 1220)  . piperacillin-tazobactam (ZOSYN)  IV    . prismasol BGK 0/2.5    . prismasol BGK 4/2.5    .  sodium bicarbonate  infusion 1000 mL 30 mL/hr at 01/30/2019 1028  . vasopressin (PITRESSIN) infusion - *FOR SHOCK* 0.03 Units/min (Jan 07, 2019 1000)    Assessment: 53 yo male transferred from [facility] after cardiac arrest.  On chronic Eliquis PTA for afib, last dose unknown.  INR 1.2 today at [facility].  No overt bleeding or complications noted.  Pharmacy asked to start anticoagulation with IV heparin.  Baseline heparin level pending (may be elevated from recent Elqiuis).  APTT was 29 at [facility] this morning.  INR elevated from shock liver.  Now has IABP in place and known hx afib.  Goal of Therapy:  Heparin level 0.2-0.5 Monitor platelets by anticoagulation protocol: Yes   Plan:  1. Start IV heparin at 1050 units/hr 2. Check heparin level/aPTT in 8 hrs 3. Daily heparin level, aPTT and CBC.  Jenetta Downer, [facility] Clinical Pharmacist Phone [phone]  2019/01/20 2:30 PM

## 2019-01-06 NOTE — Progress Notes (Signed)
Gretchin, RN from [place] cameraed in and notified of patient having myoclonic like jerking. Awaiting orders.

## 2019-01-06 NOTE — Progress Notes (Cosign Needed)
eLink Physician-Brief Progress Note Patient Name: Adam [name] DOB: October 27, 1966 MRN: [id number]   Date of Service  01/16/2019  HPI/Events of Note  Bed side RN : briefed about comfort care decision from family and got orders from Cardiology service. Cardiogenic shock. Myoclonic jerks. Multi-organ failure. AIBP, 3 pressors, CRRT.   eICU Interventions  Continue comfort care as per family /cardiologist .      Intervention Category Minor Interventions: Communication with other healthcare providers and/or family  Ranee Gosselin 02/03/2019, 10:53 PM

## 2019-01-06 NOTE — Progress Notes (Signed)
eLink Physician-Brief Progress Note Patient Name: Adam [name] DOB: [birth year] MRN: [id number]   Date of Service  02/05/2019  HPI/Events of Note  Camera: discussed with bed side RN. ? Myoclonic jerks. On AIBP, 3 pressors, CRRT. Has Keppra order, not started yet. Pupils sluggish. Not dilated. Breathing over the vent. 70% fio2. MAP 74. On bicarb drip/amiodarone.   eICU Interventions  Follow LA OK to go for Keppra.      Intervention Category Major Interventions: Seizures - evaluation and management Intermediate Interventions: Diagnostic test evaluation Minor Interventions: Communication with other healthcare providers and/or family  Ranee Gosselin 02/03/2019, 9:47 PM

## 2019-01-06 NOTE — Progress Notes (Signed)
Pharmacy Antibiotic Note  Adam [name] is a 53 y.o. male admitted on Jan 11, 2019 with aspiration PNA.  Pharmacy has been consulted for Zosyn dosing.  WBC elevated post-arrest.  Plan: Zosyn 3.375g IV q8h (4 hour infusion).  Watch renal function carefully.  If continues to rise, may need lower dosing.     No data recorded.  Recent Labs  Lab 01/03/19 2030 January 11, 2019 0940  WBC 7.1 20.1*  CREATININE 1.64* 3.22*  LATICACIDVEN  --  >11.0*    Estimated Creatinine Clearance: 30.6 mL/min (A) (by C-G formula based on SCr of 3.22 mg/dL (H)).    No Known Allergies  Antimicrobials this admission: Zosyn 3/1 >   Dose adjustments this admission:  Microbiology results: 3/1 BCx x 2:  3/1 UCx:  3/1 Sputum: 3/1 MRSA PCR:   Thank you for allowing pharmacy to be a part of this patient's care.  Jenetta Downer, [facility] Clinical Pharmacist Phone [phone]  2019-01-11 11:05 AM

## 2019-01-06 NOTE — CV Procedure (Signed)
    DIRECT CURRENT CARDIOVERSION  NAME:  Adam [name]   MRN: [id number] DOB:  1966/06/26   ADMIT DATE: 01/28/2019   INDICATIONS: Atrial fibrillation with shock   PROCEDURE:   Patient with progressive shock with escalating pressor requirements and unable to pull fluid with CVVHD. Decision made to cardiovert emergently.    Once an appropriate time out was taken, the patient had the defibrillator pads placed in the anterior and posterior position. The patinet was adequately sedated on vent. HE received a single biphasic, synchronized 200J shock with prompt conversion to sinus rhythm. No apparent complications.  [name], MD  6:30 PM

## 2019-01-06 NOTE — H&P (Signed)
NAME:  Adam [name], MRN:  [id number], DOB:  1966-04-06, LOS: 0 ADMISSION DATE:  01/18/2019,  REFERRING MD:  [facility], CHIEF COMPLAINT:  Cardiopulmonary arrest   Brief History   53 year old man with nonischemic cardiomyopathy, atrial flutter, status post cardiopulmonary arrest at OSH from presumed decompensated CHF.  Admitted with cardiogenic shock, VDRF 3/1.  History of present illness   53 year old obese man with a history of a nonischemic cardiomyopathy with VT (clean coronaries 2011, on sotalol) LVEF 25-30% 09/2017 with an [place] Jude AICD in place.  He has paroxysmal atrial flutter on anticoagulation, hyperlipidemia, untreated OSA, chronic renal insufficiency.  Prior left MCA CVA (2017) with possible associated seizure activity, paroxysmal atrial flutter.  He has been having more trouble over the last month, reported palpitations and felt he was having atrial dysrhythmias, palpitations although I do not see that any have been formally documented.  He was seen in the emergency department at [facility] 2/27 with nausea and vomiting without cough, fever, diarrhea, abdominal pain.  Received Zofran, PPI.  Seen at [facility] rocking him for similar complaints, nausea and emesis.  Admitted for IV fluids but developed progressive hypotension.  Norepinephrine was initiated and volume was given but he progressed to respiratory failure and cardiac arrest (question initial rhythm).  He underwent CPR for 6 minutes.  A chest x-ray post intubation was reported consistent with progressive pulmonary edema, ET tube in good position.    ABG post code showed profound mixed acidosis, pH 6.88.  NT pro BNP 21578.  He did have a leukocytosis 16.5 (76% neutrophils), UA 2/29 negative.  He received vancomycin, Zosyn x1 at the outside hospital.  Past Medical History   has a past medical history of A-fib [facility]), Chronic systolic heart failure (HCC), CVA (cerebral infarction), Disability examination, Drug therapy, Ejection  fraction < 50%, Headache(784.0), Hypokalemia, ICD (implantable cardiac defibrillator) battery depletion (11/11/10), ICD (implantable cardiac defibrillator) in place, Mitral regurgitation, NICM (nonischemic cardiomyopathy) (HCC), Renal insufficiency, Sleepiness, and VT (ventricular tachycardia) (HCC).   Significant Hospital Events   Cardiopulm arrest OSA 3/1  Consults:  Cardiology / Advanced CHF / EP 3/1  Procedures:  R IJ CVC 3/1 (OSH) >>  R radial art line 3/1 >>   Significant Diagnostic Tests:    Micro Data:  Blood 3/1 >>  Urine 3/1 >>  Resp 3/1 >>   Antimicrobials:  Zosyn 3/1 (empiric) >>   Interim history/subjective:  Patient profoundly unstable on arrival to ICU.  Up titrating epinephrine drip, bicarb given.  Objective   Pulse (!) 109, resp. rate (!) 31, SpO2 95 %.    Vent Mode: PRVC FiO2 (%):  [100 %] 100 % Set Rate:  [28 bmp] 28 bmp Vt Set:  [600 mL] 600 mL PEEP:  [5 cmH20] 5 cmH20 Plateau Pressure:  [24 cmH20] 24 cmH20  No intake or output data in the 24 hours ending 01/22/2019 0956 There were no vitals filed for this visit.  Examination: General: Obese man, ill-appearing, ventilated HENT: ET tube in good position, significant scleral edema with mild icterus Lungs: coarse B Cardiovascular: regular, distant, no M Abdomen: obese, non-distended, no BS heard Extremities: cool, trace edema on L, 1+ on R Neuro: unresponsive, did move to pain and seemed to localize  Resolved Hospital Problem list     Assessment & Plan:  Acute respiratory failure, VDRF. History OSA, untreated PRVC 8 cc/kg, increased respiratory rate given profound acidosis Chest x-ray pending Follow ABG Treating possible underlying causes as below, suspect cardiogenic edema  Cardiac arrest, CPR 6 minutes.  Appeared to be mixed pulmonary and cardiac due to underlying progressive CHF Do not believe that he would tolerate cooling with his degree of shock, epinephrine need, will defer  TTM  Decompensated chronic nonischemic cardiomyopathy (AICD), question related to active paroxysmal atrial flutter -Rapid bedside TTE, EF 15% with coexisting RV failure Discussed case with Dr. Gala Romney, appreciate his assistance CVP and co-oximetry pending, follow and titrate medications accordingly, formal echocardiogram and AICD interrogation pending Milrinone to start 3/1 May need IABP, mechanical support.   On Eliquis outpatient, defer anticoagulation until we determine whether he will have invasive procedures, balloon pump, etc. Defer to Dr. Gala Romney timing of sotalol, carvedilol, irbesartan Volume removal when hemodynamics will permit  Acute on chronic (stage III) renal failure, S Cr 2.59 << 1.64 on 2/27.  Associated with anion gap (lactate) metabolic acidosis Follow BMP, lactic acid, urine output with restoration perfusion pressures At high risk for overt renal failure, need for CVVHD.  Decision to do this will likely be determined by how his cardiac prognosis unfolds  Possible aspiration pneumonia, bilateral pulmonary infiltrates (suspect cardiogenic edema) Empiric Zosyn ordered  History of seizures in the setting of prior CVA Continue home Keppra, to IV  Acute encephalopathy, sedation for ventilation Versed drip stopped, fentanyl drip and Versed intermittent ordered  SUP prophylaxis PPI ordered  Nutrition N.p.o. for now, consider tube feeding as we go forward    Best practice:  Diet: NPO Pain/Anxiety/Delirium protocol (if indicated): fentanyl + versed prn VAP protocol (if indicated): ordered DVT prophylaxis: heparin sq GI prophylaxis: PPI Glucose control: SSI protocol Mobility: bedrest Code Status: Full Family Communication: none present yet, will call them Disposition: ICU  Labs   CBC: Recent Labs  Lab 01/03/19 2030  WBC 7.1  HGB 12.7*  HCT 40.0  MCV 95.9  PLT 257    Basic Metabolic Panel: Recent Labs  Lab 01/03/19 2030  NA 137  K 4.7  CL 103   CO2 25  GLUCOSE 113*  BUN 18  CREATININE 1.64*  CALCIUM 8.7*   GFR: Estimated Creatinine Clearance: 60 mL/min (A) (by C-G formula based on SCr of 1.64 mg/dL (H)). Recent Labs  Lab 01/03/19 2030  WBC 7.1    Liver Function Tests: Recent Labs  Lab 01/03/19 2030  AST 19  ALT 17  ALKPHOS 44  BILITOT 3.5*  PROT 7.4  ALBUMIN 4.2   Recent Labs  Lab 01/03/19 2030  LIPASE 40   No results for input(s): AMMONIA in the last 168 hours.  ABG    Component Value Date/Time   PHART 7.356 08/19/2010 1104   PCO2ART 49.2 (H) 08/19/2010 1104   PO2ART 71.0 (L) 08/19/2010 1104   HCO3 27.6 (H) 08/19/2010 1104   TCO2 29 08/19/2010 1104   O2SAT 93.0 08/19/2010 1104     Coagulation Profile: No results for input(s): INR, PROTIME in the last 168 hours.  Cardiac Enzymes: No results for input(s): CKTOTAL, CKMB, CKMBINDEX, TROPONINI in the last 168 hours.  HbA1C: No results found for: HGBA1C  CBG: No results for input(s): GLUCAP in the last 168 hours.  Review of Systems:   Unable to obtain  Past Medical History  He,  has a past medical history of A-fib [facility]), Chronic systolic heart failure (HCC), CVA (cerebral infarction), Disability examination, Drug therapy, Ejection fraction < 50%, Headache(784.0), Hypokalemia, ICD (implantable cardiac defibrillator) battery depletion (11/11/10), ICD (implantable cardiac defibrillator) in place, Mitral regurgitation, NICM (nonischemic cardiomyopathy) (HCC), Renal insufficiency, Sleepiness, and VT (ventricular  tachycardia) (HCC).   Surgical History    Past Surgical History:  Procedure Laterality Date  . CARDIAC DEFIBRILLATOR PLACEMENT  11/11/10   SJM Fortify DR ICD implanted by Dr Johney Frame  . COLONOSCOPY N/A 07/13/2016   Procedure: COLONOSCOPY;  Surgeon: Corbin Ade, MD;  Location: [facility] SUITE;  Service: Endoscopy;  Laterality: N/A;  12:00 pm - pt can't move up due to transportation  . TEE WITH CARDIOVERSION  11/17/08   No LV clot     Social  History   reports that he has never smoked. He has never used smokeless tobacco. He reports current alcohol use. He reports that he does not use drugs.   Family History   His family history includes Heart attack in his brother and paternal grandfather; Heart attack (age of onset: 26) in his father; Heart failure in his brother; Hypertension in his mother; Stroke in his father and mother. There is no history of Colon cancer.   Allergies No Known Allergies   Home Medications  Prior to Admission medications   Medication Sig Start Date End Date Taking? Authorizing Provider  allopurinol (ZYLOPRIM) 300 MG tablet Take 300 mg by mouth every morning.  06/03/16   [provider]  atorvastatin (LIPITOR) 80 MG tablet Take 80 mg by mouth at bedtime. 01/20/16   [provider]  carvedilol (COREG) 12.5 MG tablet Take 12.5 mg by mouth 2 (two) times daily with a meal.    [provider]  ELIQUIS 5 MG TABS tablet Take 5 mg by mouth 2 (two) times daily. 01/20/16   [provider]  furosemide (LASIX) 40 MG tablet TAKE 1 AND 1/2 TABLETS BY MOUTH IN THE MORNING & TAKE ONE TABLET BY MOUTH IN THE EVENING Patient taking differently: Take 40-60 mg by mouth See admin instructions. TAKE 1 AND 1/2 TABLETS BY MOUTH IN THE MORNING & TAKE ONE TABLET BY MOUTH IN THE EVENING 08/02/18   Antoine Poche, MD  irbesartan (AVAPRO) 75 MG tablet TAKE ONE TABLET BY MOUTH DAILY. STOP ENTRESTO. Patient taking differently: Take 75 mg by mouth every evening.  08/01/18   Antoine Poche, MD  levETIRAcetam (KEPPRA) 500 MG tablet TAKE ONE TABLET BY MOUTH TWICE DAILY Patient taking differently: Take 500 mg by mouth 2 (two) times daily.  06/05/18   Drema Dallas, DO  ondansetron (ZOFRAN ODT) 4 MG disintegrating tablet 4mg  ODT q4 hours prn nausea/vomit 01/03/19   Bethann Berkshire, MD  pantoprazole (PROTONIX) 20 MG tablet Take 1 tablet (20 mg total) by mouth daily. 01/03/19   Bethann Berkshire, MD  potassium  chloride (K-DUR,KLOR-CON) 10 MEQ tablet Take 10 mEq by mouth 2 (two) times daily.  09/03/18   [provider]  SOTALOL AF 120 MG TABS TAKE ONE TABLET BY MOUTH EVERY TWELVE HOURS Patient taking differently: Take 120 mg by mouth every 12 (twelve) hours.  07/02/18   Hillis Range, MD     Critical care time: 17     Levy Pupa, MD, PhD 02-04-19, 10:26 AM [facility] [phone] or if no answer [phone]

## 2019-01-06 NOTE — Progress Notes (Signed)
Patient arrived back from cath lab with balloon pump 1:1. Femoral site clean, dry and intact. RN unable to doppler pulses. MD aware. Will continue to monitor.

## 2019-01-06 NOTE — Progress Notes (Signed)
Desiree (sister) requested to speak with Bensimhon, MD regarding family decision for comfort care. Family requests patient be comfort care tonight once family arrives to unit. Verbal orders from Bensimhon, MD to turn Fentanyl up to 259mcg/hr, give 5mg  of Versed and 300 mcg of Fentanyl, and start patient on a 5 of versed gtt, before stopping all machines. CCM and RT aware of family decision.

## 2019-01-06 NOTE — Progress Notes (Signed)
CDS consulted.

## 2019-01-06 NOTE — CV Procedure (Signed)
Left radial arterial line placement.   Emergent consent assumed. The leftt wrist was prepped and draped in the routine sterile fashion a single lumen radial arterial catheter was placed in the left radial artery using a modified Seldinger technique. Good blood flow and wave forms. A dressing was placed.   [name], MD  11:00 AM

## 2019-01-06 NOTE — Interval H&P Note (Signed)
History and Physical Interval Note:  02/05/2019 12:28 PM  Adam [name]  has presented today for surgery, with the diagnosis of cardiogenic shock  The various methods of treatment have been discussed with the patient and family. After consideration of risks, benefits and other options for treatment, the patient has consented to  Procedure: RHC, IABP and insertion of HD cath as a surgical intervention .  The patient's history has been reviewed, patient examined, no change in status, stable for surgery.  I have reviewed the patient's chart and labs.  Questions were answered to the patient's satisfaction.     Yuridia Couts

## 2019-01-06 NOTE — Progress Notes (Signed)
Unable to obtain sputum sample collection at this time. Patient does not have enough secretions to obtain.

## 2019-01-06 NOTE — Progress Notes (Addendum)
ANTICOAGULATION CONSULT NOTE - Initial Consult  Pharmacy Consult for IV heparin Indication: atrial fibrillation  No Known Allergies  Patient Measurements:   Heparin Dosing Weight: 90.7 kg  Vital Signs: Pulse Rate: 109 (03/01 0850)  Labs: Recent Labs    01/03/19 2030 01/27/2019 0940 02/01/2019 0950  HGB 12.7* 10.7* 11.9*  HCT 40.0 34.9* 35.0*  PLT 257 224  --   CREATININE 1.64*  --   --     Estimated Creatinine Clearance: 60 mL/min (A) (by C-G formula based on SCr of 1.64 mg/dL (H)).   Medical History: Past Medical History:  Diagnosis Date  . A-fib (HCC)   . Chronic systolic heart failure (HCC)   . CVA (cerebral infarction)   . Disability examination    Discussion of disability September, 2013  . Drug therapy    Sotalol started for VT storm  July, 2013  . Ejection fraction < 50%    Echo 06/05/12: Mild LVH, EF 20%, posterior severe HK, anterolateral severe HK, anterior severe HK, mid to apical septal AK, AK of the true apex, apical inferior HK, restrictive physiology, trivial MR, moderate LAE, mildly reduced RV function, mild TR.  Marland Kitchen Headache(784.0)    Patient seen to have headaches from spironolactone.  Drug was stopped, August, 2012  . Hypokalemia    There was some decrease in potassium with his admission in July, 2013. We will watch this very carefully.  . ICD (implantable cardiac defibrillator) battery depletion 11/11/10   [employer]. Jude, Dr.Allred  . 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 Mitral regurgitation   . NICM (nonischemic cardiomyopathy) (HCC)    Nonischemic / catheterization October, 2011, normal coronary arteries  . Renal insufficiency    Creatinine 1.5 (GFR greater than 50)  . Sleepiness    has some sleepiness during the day  . VT (ventricular tachycardia) (HCC)    VT storm 7/13 - Sotalol started    Medications:  Scheduled:  . fentaNYL (SUBLIMAZE) injection  50 mcg Intravenous Once  . insulin aspart  2-6 Units Subcutaneous Q4H  .  pantoprazole (PROTONIX) IV  40 mg Intravenous QHS  . sodium bicarbonate       Infusions:  . epinephrine 14 mcg/min (01/29/2019 0945)  . fentaNYL infusion INTRAVENOUS 25 mcg/hr (01/17/2019 1024)  . levETIRAcetam    . milrinone    . norepinephrine (LEVOPHED) Adult infusion 58 mcg/min (01/09/2019 0945)  .  sodium bicarbonate  infusion 1000 mL 30 mL/hr at 01/20/2019 1028  . vasopressin (PITRESSIN) infusion - *FOR SHOCK*      Assessment: 53 yo male transferred from [facility] after cardiac arrest.  On chronic Eliquis PTA for afib, last dose unknown.  INR 1.2 today at [facility].  No overt bleeding or complications noted.  Pharmacy asked to start anticoagulation with IV heparin.  Baseline heparin level pending.  APTT was 29 at [facility] this morning.  May require IABP or Impella this afternoon.  Goal of Therapy:  Heparin level 0.3-0.7 units/ml Monitor platelets by anticoagulation protocol: Yes   Plan:  1. Start IV heparin at 1250 units/hr 2. Check aPTT 6 hrs after gtt starts. 3. Daily heparin level and aPTT. 4. F/u plans for invasive device placement, may need to hold heparin.  Jenetta Downer, [facility] Clinical Pharmacist Phone [phone]  01/15/2019 10:52 AM   Addendum -  INR returned > 5, likely from shock liver. Will hold off on heparin for now.  Reece Leader, Loura Back, BCPS, [facility] Clinical Pharmacist Phone (  [phone]  02/03/2019 11:03 AM

## 2019-01-06 NOTE — H&P (View-Only) (Signed)
  [facility] TEAM NOTE   Patient with persistent hypotension despite multiple pressors including EPI at 14 and NE 42.  CVP 29.   Labs back with INR 5.1 and Cr 3.22 and markedly elevated transaminases.   ICD interrogation shows AFL for > 1 month. I tired to pace him out but failed.   Given MSOF and ongoing hypotension. Only option to try and save him is to proceed with mechanical support and possible CRRT.   Will give 2u FFP and take to cath lab for IABP, swan and HD cath placement.   D/w his sister who is aware that he is likely not to survive this hospitalization.   Of note, multiple male family members have died from NICM. Family need genetic w/u.  Additional CCT 45 mins  Daniel Bensimhon, MD  12:17 PM   

## 2019-01-06 NOTE — Progress Notes (Signed)
Patient currently hypotensive MAP's remaining between 30 and 40. Providers at the bedside. RN will implement orders as received. Patient arrived on Levophed at 62. Currently still running above ceiling. Providers aware. Will continue to monitor.

## 2019-01-06 NOTE — Progress Notes (Signed)
Pharmacy Antibiotic Note  Adam [name] is a 53 y.o. male admitted on 2019-01-19 with aspiration PNA.  Pharmacy has been consulted for Zosyn dosing.  WBC elevated post-arrest.  Initiating CRRT this afternoon.  Plan: Change Zosyn to 3.375g IV q 6 hrs while on CRRT F/u if he tolerates CRRT. Watch renal function carefully.  If continues to rise, may need lower dosing.     No data recorded.  Recent Labs  Lab 01/03/19 2030 01-19-2019 0940  WBC 7.1 20.1*  CREATININE 1.64* 3.22*  LATICACIDVEN  --  >11.0*    Estimated Creatinine Clearance: 30.6 mL/min (A) (by C-G formula based on SCr of 3.22 mg/dL (H)).    No Known Allergies  Antimicrobials this admission: Zosyn 3/1 >   Dose adjustments this admission:  Microbiology results: 3/1 BCx x 2:  3/1 UCx:  3/1 Sputum: 3/1 MRSA PCR:   Thank you for allowing pharmacy to be a part of this patient's care.  Jenetta Downer, [facility] Clinical Pharmacist Phone [phone]  01/19/19 2:35 PM

## 2019-01-06 NOTE — Consult Note (Signed)
Reason for Consult: AKI/CKD stage 3 Referring Physician: Gala Romney, MD  Adam [name] is an 53 y.o. male.  HPI: Adam [name] has a PMH significant for NICM (EF 25%) with h/o VT s/p AICD, atrial flutter, CVA, and CKD stage 3 who presented to an OSH with worsening SOB, N/V.  He devloped progressive hypotension and eventually progressed to VDRF and cardiac arrest.  He was transferred to [facility] ICU today for ongoing cardiogenic shock.  Despite maximum pressors, he remained hypotensive and went to the cath lab today for IABP.  We have been consulted due to the development of oliguric ARF.  The trend in Scr is seen below.  His course was further complicated by hyperkalemia, metabolic acidosis, and shock liver.  Trend in Creatinine: Creatinine, Ser  Date/Time Value Ref Range Status  2019/01/30 09:40 AM 3.22 (H) 0.61 - 1.24 mg/dL Final  57/26/2035 59:74 PM 1.64 (H) 0.61 - 1.24 mg/dL Final  16/38/4536 46:80 PM 1.28 0.70 - 1.33 mg/dL Final  32/10/2481 50:03 PM 1.6 (H) 0.4 - 1.5 mg/dL Final  70/48/8891 69:45 AM 1.6 (H) 0.4 - 1.5 mg/dL Final  03/88/8280 03:49 AM 1.39 (H) 0.50 - 1.35 mg/dL Final  17/91/5056 97:94 PM 1.44 (H) 0.50 - 1.35 mg/dL Final  80/16/5537 48:27 AM 1.33 0.50 - 1.35 mg/dL Final  07/86/7544 92:01 PM 1.32 0.50 - 1.35 mg/dL Final  00/71/2197 58:83 PM 1.30 0.50 - 1.35 mg/dL Final  25/49/8264 15:83 AM 1.55 (H) 0.4 - 1.5 mg/dL Final    PMH:   Past Medical History:  Diagnosis Date  . A-fib (HCC)   . Chronic systolic heart failure (HCC)   . CVA (cerebral infarction)   . Disability examination    Discussion of disability September, 2013  . Drug therapy    Sotalol started for VT storm  July, 2013  . Ejection fraction < 50%    Echo 06/05/12: Mild LVH, EF 20%, posterior severe HK, anterolateral severe HK, anterior severe HK, mid to apical septal AK, AK of the true apex, apical inferior HK, restrictive physiology, trivial MR, moderate LAE, mildly reduced RV function, mild TR.  Marland Kitchen  Headache(784.0)    Patient seen to have headaches from spironolactone.  Drug was stopped, August, 2012  . Hypokalemia    There was some decrease in potassium with his admission in July, 2013. We will watch this very carefully.  . ICD (implantable cardiac defibrillator) battery depletion 11/11/10   [employer], Dr.Allred  . 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 Mitral regurgitation   . NICM (nonischemic cardiomyopathy) (HCC)    Nonischemic / catheterization October, 2011, normal coronary arteries  . Renal insufficiency    Creatinine 1.5 (GFR greater than 50)  . Sleepiness    has some sleepiness during the day  . VT (ventricular tachycardia) (HCC)    VT storm 7/13 - Sotalol started    PSH:   Past Surgical History:  Procedure Laterality Date  . CARDIAC DEFIBRILLATOR PLACEMENT  11/11/10   SJM Fortify DR ICD implanted by Dr Johney Frame  . COLONOSCOPY N/A 07/13/2016   Procedure: COLONOSCOPY;  Surgeon: Corbin Ade, MD;  Location: [facility];  Service: Endoscopy;  Laterality: N/A;  12:00 pm - pt can't move up due to transportation  . TEE WITH CARDIOVERSION  11/17/08   No LV clot    Allergies: No Known Allergies  Medications:   Prior to Admission medications   Medication Sig Start Date End Date Taking? Authorizing Provider  allopurinol (ZYLOPRIM) 300  MG tablet Take 300 mg by mouth every morning.  06/03/16   [provider]  atorvastatin (LIPITOR) 80 MG tablet Take 80 mg by mouth at bedtime. 01/20/16   [provider]  carvedilol (COREG) 12.5 MG tablet Take 12.5 mg by mouth 2 (two) times daily with a meal.    [provider]  ELIQUIS 5 MG TABS tablet Take 5 mg by mouth 2 (two) times daily. 01/20/16   [provider]  furosemide (LASIX) 40 MG tablet TAKE 1 AND 1/2 TABLETS BY MOUTH IN THE MORNING & TAKE ONE TABLET BY MOUTH IN THE EVENING Patient taking differently: Take 40-60 mg by mouth See admin instructions. TAKE 1 AND 1/2 TABLETS BY MOUTH IN THE  MORNING & TAKE ONE TABLET BY MOUTH IN THE EVENING 08/02/18   Antoine Poche, MD  irbesartan (AVAPRO) 75 MG tablet TAKE ONE TABLET BY MOUTH DAILY. STOP ENTRESTO. Patient taking differently: Take 75 mg by mouth every evening.  08/01/18   Antoine Poche, MD  levETIRAcetam (KEPPRA) 500 MG tablet TAKE ONE TABLET BY MOUTH TWICE DAILY Patient taking differently: Take 500 mg by mouth 2 (two) times daily.  06/05/18   Drema Dallas, DO  ondansetron (ZOFRAN ODT) 4 MG disintegrating tablet 4mg  ODT q4 hours prn nausea/vomit 01/03/19   Bethann Berkshire, MD  pantoprazole (PROTONIX) 20 MG tablet Take 1 tablet (20 mg total) by mouth daily. 01/03/19   Bethann Berkshire, MD  potassium chloride (K-DUR,KLOR-CON) 10 MEQ tablet Take 10 mEq by mouth 2 (two) times daily.  09/03/18   [provider]  SOTALOL AF 120 MG TABS TAKE ONE TABLET BY MOUTH EVERY TWELVE HOURS Patient taking differently: Take 120 mg by mouth every 12 (twelve) hours.  07/02/18   Hillis Range, MD    Inpatient medications: . [MAR Hold] sodium chloride   Intravenous Once  . [MAR Hold] amiodarone  150 mg Intravenous Once  . [MAR Hold] fentaNYL (SUBLIMAZE) injection  50 mcg Intravenous Once  . [MAR Hold] insulin aspart  2-6 Units Subcutaneous Q4H  . [MAR Hold] pantoprazole (PROTONIX) IV  40 mg Intravenous QHS    Discontinued Meds:   Medications Discontinued During This Encounter  Medication Reason  . sodium bicarbonate 150 mEq in dextrose 5 % 1,000 mL infusion   . milrinone (PRIMACOR) 20 MG/100 ML (0.2 mg/mL) infusion Entry Error  . heparin injection 5,000 Units   . norepinephrine (LEVOPHED) 4mg  in premix infusion   . heparin injection Patient Discharge  . sodium bicarbonate injection Patient Discharge  . Heparin (Porcine) in NaCl 1000-0.9 UT/500ML-% SOLN Patient Discharge  . lidocaine (PF) (XYLOCAINE) 1 % injection Patient Discharge    Social History:  reports that he has never smoked. He has never used smokeless tobacco. He  reports current alcohol use. He reports that he does not use drugs.  Family History:   Family History  Problem Relation Age of Onset  . Heart attack Father 61       Multiple MIs  . Stroke Father   . Heart attack Brother   . Heart failure Brother        CHF  . Hypertension Mother   . Stroke Mother   . Heart attack Paternal Grandfather   . Colon cancer Neg Hx     Review of systems not obtained due to patient factors. Weight change:  No intake or output data in the 24 hours ending 02/01/2019 1402 BP (!) 113/59   Pulse 65   Resp  16   SpO2 100%  Vitals:   01/30/2019 1314 01/26/2019 1318 01/12/2019 1323 01/15/2019 1328  BP: (!) 104/48 (!) 102/56 104/61 (!) 113/59  Pulse: 79 82 (!) 57 65  Resp: 18 18 20 16   SpO2: 99% 99% 99% 100%     General appearance: intubated and sedated Head: Normocephalic, without obvious abnormality, atraumatic Eyes: positive findings: conjunctiva: edema Resp: rhonchi bilaterally Cardio: irregularly irregular rhythm and no rub GI: soft, non-tender; bowel sounds normal; no masses,  no organomegaly Extremities: extremities normal, atraumatic, no cyanosis or edema  Labs: Basic Metabolic Panel: Recent Labs  Lab 01/03/19 2030 01/09/2019 0940 01/14/2019 0950  NA 137 136 133*  K 4.7 5.6* 5.8*  CL 103 95*  --   CO2 25 21*  --   GLUCOSE 113* 220*  --   BUN 18 30*  --   CREATININE 1.64* 3.22*  --   ALBUMIN 4.2 2.5*  --   CALCIUM 8.7* 7.2*  --   PHOS  --  8.9*  --    Liver Function Tests: Recent Labs  Lab 01/03/19 2030 01/20/2019 0940  AST 19 1,252*  ALT 17 1,382*  ALKPHOS 44 49  BILITOT 3.5* 5.3*  PROT 7.4 4.5*  ALBUMIN 4.2 2.5*   Recent Labs  Lab 01/03/19 2030  LIPASE 40   No results for input(s): AMMONIA in the last 168 hours. CBC: Recent Labs  Lab 01/03/19 2030 01/12/2019 0940 01/10/2019 0950  WBC 7.1 20.1*  --   HGB 12.7* 10.7* 11.9*  HCT 40.0 34.9* 35.0*  MCV 95.9 100.3*  --   PLT 257 224  --    PT/INR: @LABRCNTIP (inr:5) Cardiac  Enzymes: )No results for input(s): CKTOTAL, CKMB, CKMBINDEX, TROPONINI in the last 168 hours. CBG: No results for input(s): GLUCAP in the last 168 hours.  Iron Studies: No results for input(s): IRON, TIBC, TRANSFERRIN, FERRITIN in the last 168 hours.  Xrays/Other Studies: Dg Chest Port 1 View  Result Date: 01/17/2019 CLINICAL DATA:  Chest pain. Acute respiratory failure. Endotracheal tube present. EXAM: PORTABLE CHEST 1 VIEW COMPARISON:  Earlier today FINDINGS: A new nasogastric tube is seen entering the stomach. Endotracheal tube and right jugular central venous catheter remain in appropriate position. No pneumothorax visualized. Cardiomegaly stable. Transvenous pacemaker remains in appropriate position. Bilateral symmetric airspace disease shows no significant change, likely due to acute pulmonary edema. IMPRESSION: New nasogastric tube entering the stomach. Stable cardiomegaly. Stable symmetric bilateral airspace disease, likely due to acute pulmonary edema. Electronically Signed   By: Myles Rosenthal M.D.   On: 01/27/2019 10:51     Assessment/Plan: 1.  AKI/CKD stage 3 presumably due to ischemic ATN in setting of cardiogenic shock following arrest and maximal pressor dependence.  He has been oliguric and developed hyperkalemia, and metabolic acidosis.  BP's have improved after IABP but given worsening electrolyte abnormalities, will plan to initiate CVVHD and follow. 2. Cardiogenic shock- on pressors and IABP per Cardiology 3. Hyperkalemia- as above 4.  Metabolic acidosis- as above 5. VDRF- per PCCM 6. Leukocytosis 7. H/o seizures with previous CVA- on keppra 8. Possible aspiration pneumonia on empiric zosyn 9. Disposition- poor overall prognosis but agree with aggressive measures for now.  Will need to re-evaluate his condition.   Jomarie Longs A Fatema Rabe 01/26/2019, 2:02 PM

## 2019-01-07 ENCOUNTER — Encounter (HOSPITAL_COMMUNITY): Payer: Self-pay | Admitting: Internal Medicine

## 2019-01-07 LAB — POCT I-STAT EG7
ACID-BASE DEFICIT: 12 mmol/L — AB (ref 0.0–2.0)
ACID-BASE DEFICIT: 13 mmol/L — AB (ref 0.0–2.0)
BICARBONATE: 15.7 mmol/L — AB (ref 20.0–28.0)
Bicarbonate: 15.1 mmol/L — ABNORMAL LOW (ref 20.0–28.0)
Calcium, Ion: 0.87 mmol/L — CL (ref 1.15–1.40)
Calcium, Ion: 0.81 mmol/L — CL (ref 1.15–1.40)
HCT: 34 % — ABNORMAL LOW (ref 39.0–52.0)
HEMATOCRIT: 35 % — AB (ref 39.0–52.0)
Hemoglobin: 11.9 g/dL — ABNORMAL LOW (ref 13.0–17.0)
Hemoglobin: 11.6 g/dL — ABNORMAL LOW (ref 13.0–17.0)
O2 Saturation: 52 %
O2 Saturation: 51 %
PO2 VEN: 34 mmHg (ref 32.0–45.0)
Potassium: 5.6 mmol/L — ABNORMAL HIGH (ref 3.5–5.1)
Potassium: 5.3 mmol/L — ABNORMAL HIGH (ref 3.5–5.1)
SODIUM: 135 mmol/L (ref 135–145)
Sodium: 137 mmol/L (ref 135–145)
TCO2: 17 mmol/L — ABNORMAL LOW (ref 22–32)
TCO2: 16 mmol/L — ABNORMAL LOW (ref 22–32)
pCO2, Ven: 42.5 mmHg — ABNORMAL LOW (ref 44.0–60.0)
pCO2, Ven: 40.8 mmHg — ABNORMAL LOW (ref 44.0–60.0)
pH, Ven: 7.177 — CL (ref 7.250–7.430)
pH, Ven: 7.176 — CL (ref 7.250–7.430)
pO2, Ven: 34 mmHg (ref 32.0–45.0)

## 2019-01-07 LAB — PREPARE FRESH FROZEN PLASMA
Unit division: 0
Unit division: 0

## 2019-01-07 LAB — BPAM FFP
Blood Product Expiration Date: 202003052359
Blood Product Expiration Date: 202003052359
ISSUE DATE / TIME: 202003011129
ISSUE DATE / TIME: 202003011129
Unit Type and Rh: 6200
Unit Type and Rh: 6200

## 2019-01-07 LAB — POCT ACTIVATED CLOTTING TIME
Activated Clotting Time: 147 seconds
Activated Clotting Time: 147 seconds

## 2019-01-07 MED FILL — Dextrose Inj 5%: INTRAVENOUS | Qty: 250 | Status: AC

## 2019-01-07 MED FILL — Norepinephrine Bitartrate IV Soln 1 MG/ML (Base Equivalent): INTRAVENOUS | Qty: 4 | Status: AC

## 2019-01-07 MED FILL — Heparin Sod (Porcine)-NaCl IV Soln 1000 Unit/500ML-0.9%: INTRAVENOUS | Qty: 500 | Status: AC

## 2019-01-07 MED FILL — Dextrose Inj 5%: INTRAVENOUS | Qty: 100 | Status: AC

## 2019-01-08 LAB — HIV ANTIBODY (ROUTINE TESTING W REFLEX): HIV Screen 4th Generation wRfx: NONREACTIVE

## 2019-01-11 ENCOUNTER — Encounter: Payer: Medicare HMO | Admitting: Internal Medicine

## 2019-01-11 LAB — CULTURE, BLOOD (ROUTINE X 2)
CULTURE: NO GROWTH
Culture: NO GROWTH
SPECIAL REQUESTS: ADEQUATE
Special Requests: ADEQUATE

## 2019-01-16 ENCOUNTER — Telehealth: Payer: Self-pay | Admitting: *Deleted

## 2019-01-16 NOTE — Telephone Encounter (Signed)
Received Copy D/C from [facility],D/C forwarded to Dr.Byrum to be signed.

## 2019-01-17 NOTE — Telephone Encounter (Signed)
Received signed copy-D/C faxed to to funeral home as requested. Waiting on original to arrive.

## 2019-01-18 NOTE — Telephone Encounter (Signed)
Received original D/C-D/C forwarded to Dr.Byrum to be signed.

## 2019-01-21 ENCOUNTER — Ambulatory Visit: Payer: Medicare HMO | Admitting: Neurology

## 2019-01-22 ENCOUNTER — Telehealth: Payer: Self-pay | Admitting: Emergency Medicine

## 2019-01-22 ENCOUNTER — Ambulatory Visit: Payer: Medicare HMO | Admitting: Cardiology

## 2019-01-22 NOTE — Telephone Encounter (Signed)
01/22/2019 received D/C back from Dr.Byrum mailed to [facility]. Health Dept. In envelope provided... PWR

## 2019-01-30 ENCOUNTER — Ambulatory Visit (HOSPITAL_COMMUNITY): Payer: Self-pay | Admitting: Psychiatry

## 2019-02-06 NOTE — Progress Notes (Signed)
IABP, Vent, and CRRT turned off while family at bedside per request.

## 2019-02-06 NOTE — Progress Notes (Signed)
Patient time of death 40. This RN and Saverio Danker, RN listened for one full minute for heart and lung sounds. No heart and lung sounds present. Family at bedside. 20 of Versed, 290 of Fentanyl wasted with Gerilyn Nestle, RN and Morrie Sheldon, Charity fundraiser. Bensimhon, MD and Pola Corn, MD notified of time of death.

## 2019-02-06 DEATH — deceased

## 2019-02-08 ENCOUNTER — Encounter: Payer: Medicare HMO | Admitting: Internal Medicine

## 2019-02-08 DIAGNOSIS — E875 Hyperkalemia: Secondary | ICD-10-CM | POA: Diagnosis present

## 2019-02-08 DIAGNOSIS — N182 Chronic kidney disease, stage 2 (mild): Secondary | ICD-10-CM

## 2019-02-08 DIAGNOSIS — E872 Acidosis, unspecified: Secondary | ICD-10-CM | POA: Diagnosis present

## 2019-02-08 DIAGNOSIS — N179 Acute kidney failure, unspecified: Secondary | ICD-10-CM | POA: Diagnosis present

## 2019-02-08 DIAGNOSIS — K7200 Acute and subacute hepatic failure without coma: Secondary | ICD-10-CM | POA: Diagnosis present

## 2019-02-08 DIAGNOSIS — G4733 Obstructive sleep apnea (adult) (pediatric): Secondary | ICD-10-CM | POA: Diagnosis present

## 2019-02-08 DIAGNOSIS — E8729 Other acidosis: Secondary | ICD-10-CM | POA: Diagnosis present

## 2019-03-08 NOTE — Death Summary Note (Signed)
  DEATH SUMMARY   Patient Details  Name: Adam [name] MRN: [id number] DOB: 15-Jan-1966  Admission/Discharge Information   Admit Date:  01/26/2019  Date of Death: Date of Death: 2019-01-27  Time of Death: Time of Death: 2034/03/13  Length of Stay: 1  Referring Physician: Kirstie Peri, MD   Reason(s) for Hospitalization  Acute respiratory failure due to cardiogenic shock, decompensated systolic CHF  Diagnoses  Preliminary cause of death:   Cardiogenic shock Secondary Diagnoses (including complications and co-morbidities):  Principal Problem:   Cardiogenic shock [facility]) Active Problems:   Mitral regurgitation   Ventricular tachyarrhythmia (HCC)   Acute on chronic systolic (congestive) heart failure (HCC)   Nonischemic cardiomyopathy (HCC)   Cardiac arrest (HCC)   Acute respiratory failure with hypoxia (HCC)   Atrial flutter (HCC)   Acute renal failure superimposed on stage 2 chronic kidney disease (HCC)   OSA (obstructive sleep apnea)   Lactic acidosis   High anion gap metabolic acidosis   Shock liver   Hyperkalemia   Brief Hospital Course (including significant findings, care, treatment, and services provided and events leading to death)  Adam [name] is a 53 y.o. year old male a history of a nonischemic cardiomyopathy with VT (clean coronaries 03-13-10, on sotalol) LVEF 25-30% 09/2017 s/p [employer]Jude AICD. He has paroxysmal atrial flutter on anticoagulation, hyperlipidemia, untreated OSA, chronic renal insufficiency. Prior left MCA CVA.   Seen at [facility] for abdominal pain, nausea and emesis. Admitted for IV fluids but developed progressive hypotension.  Norepinephrine was initiated and volume was given but he progressed to respiratory failure and cardiac arrest (question initial rhythm).  He underwent CPR for 6 minutes.  A chest x-ray post intubation was reported consistent with progressive pulmonary edema.  Found to have a mixed metabolic and respiratory acidosis, evolving acute on  chronic (stage II) renal failure.  He transferred to [facility] on epinephrine drip, norepinephrine drip, vasopressin and remained in shock.  Bedside echocardiogram showed an LVEF 15% with impaired RV function.  He was continued on pressors, all aggressive therapy including mechanical ventilation, empiric antibiotic in case there was occult infection present.  And IABP and PA catheter were placed on 3/1 urgently.  In the setting of his shock and with the need for volume removal dialysis catheter was placed and CVVHD was initiated.  Unfortunately despite all aggressive care patient continued to decline with progression of his shock.  He was also noted to have myoclonic jerking.  Discussions were undertaken with the patient's family regarding his prognosis and goals for care.  He was transitioned to a comfort based approach given his poor prognosis for meaningful recovery on the evening of 01/26/19.  He expired on 01/27/19.   Levy Pupa, MD, PhD 02/08/2019, 3:13 PM [facility] [phone] or if no answer [phone]
